# Patient Record
Sex: Male | Born: 1960 | Race: Black or African American | Hispanic: No
Health system: Southern US, Community
[De-identification: ages and names within clinical notes are randomized; demographics above are authoritative.]

## PROBLEM LIST (undated history)

## (undated) DIAGNOSIS — I10 Essential (primary) hypertension: Secondary | ICD-10-CM

## (undated) DIAGNOSIS — E119 Type 2 diabetes mellitus without complications: Secondary | ICD-10-CM

## (undated) DIAGNOSIS — Z720 Tobacco use: Secondary | ICD-10-CM

## (undated) DIAGNOSIS — E781 Pure hyperglyceridemia: Secondary | ICD-10-CM

## (undated) DIAGNOSIS — F1410 Cocaine abuse, uncomplicated: Secondary | ICD-10-CM

## (undated) DIAGNOSIS — I639 Cerebral infarction, unspecified: Secondary | ICD-10-CM

## (undated) DIAGNOSIS — F1010 Alcohol abuse, uncomplicated: Secondary | ICD-10-CM

## (undated) DIAGNOSIS — N289 Disorder of kidney and ureter, unspecified: Secondary | ICD-10-CM

## (undated) HISTORY — PX: NO PAST SURGERIES: SHX2092

---

## 2004-03-10 ENCOUNTER — Other Ambulatory Visit: Payer: Self-pay

## 2005-01-22 ENCOUNTER — Emergency Department: Payer: Self-pay | Admitting: Emergency Medicine

## 2005-04-09 ENCOUNTER — Emergency Department: Payer: Self-pay | Admitting: Emergency Medicine

## 2005-05-19 ENCOUNTER — Other Ambulatory Visit: Payer: Self-pay

## 2005-05-19 ENCOUNTER — Emergency Department: Payer: Self-pay | Admitting: Emergency Medicine

## 2005-06-17 ENCOUNTER — Emergency Department: Payer: Self-pay | Admitting: Emergency Medicine

## 2005-07-01 ENCOUNTER — Other Ambulatory Visit: Payer: Self-pay

## 2005-07-01 ENCOUNTER — Emergency Department: Payer: Self-pay | Admitting: General Practice

## 2006-01-12 ENCOUNTER — Emergency Department: Payer: Self-pay | Admitting: Emergency Medicine

## 2007-03-27 ENCOUNTER — Inpatient Hospital Stay: Payer: Self-pay | Admitting: Internal Medicine

## 2007-03-27 ENCOUNTER — Other Ambulatory Visit: Payer: Self-pay

## 2008-03-26 ENCOUNTER — Emergency Department: Payer: Self-pay | Admitting: Emergency Medicine

## 2009-10-20 ENCOUNTER — Emergency Department: Payer: Self-pay | Admitting: Unknown Physician Specialty

## 2010-12-02 ENCOUNTER — Emergency Department: Payer: Self-pay | Admitting: Emergency Medicine

## 2011-11-21 ENCOUNTER — Emergency Department: Payer: Self-pay | Admitting: Emergency Medicine

## 2011-11-21 LAB — BASIC METABOLIC PANEL
Chloride: 103 mmol/L (ref 98–107)
Co2: 20 mmol/L — ABNORMAL LOW (ref 21–32)
Creatinine: 1.01 mg/dL (ref 0.60–1.30)
EGFR (African American): >60
Glucose: 55 mg/dL — ABNORMAL LOW (ref 65–99)
Potassium: 3.5 mmol/L (ref 3.5–5.1)
Sodium: 138 mmol/L (ref 136–145)

## 2011-11-21 LAB — DRUG SCREEN, URINE
Barbiturates, Ur Screen: NEGATIVE (ref ?–200)
Benzodiazepine, Ur Scrn: NEGATIVE (ref ?–200)
Cannabinoid 50 Ng, Ur ~~LOC~~: NEGATIVE (ref ?–50)
Methadone, Ur Screen: NEGATIVE (ref ?–300)
Opiate, Ur Screen: NEGATIVE (ref ?–300)
Tricyclic, Ur Screen: NEGATIVE (ref ?–1000)

## 2011-11-21 LAB — URINALYSIS, COMPLETE
Bilirubin,UR: NEGATIVE
Blood: NEGATIVE
Hyaline Cast: 1
Leukocyte Esterase: NEGATIVE
Protein: NEGATIVE
Specific Gravity: 1.023 (ref 1.003–1.030)
Squamous Epithelial: 1
WBC UR: 2 /HPF (ref 0–5)

## 2011-11-21 LAB — CBC
HCT: 45.7 % (ref 40.0–52.0)
HGB: 14.4 g/dL (ref 13.0–18.0)
MCHC: 31.5 g/dL — ABNORMAL LOW (ref 32.0–36.0)
MCV: 91 fL (ref 80–100)
RDW: 13.6 % (ref 11.5–14.5)

## 2012-12-17 ENCOUNTER — Emergency Department: Payer: Self-pay | Admitting: Emergency Medicine

## 2012-12-17 LAB — CBC
HCT: 42.7 % (ref 40.0–52.0)
HGB: 14.2 g/dL (ref 13.0–18.0)
MCH: 29.5 pg (ref 26.0–34.0)
MCHC: 33.1 g/dL (ref 32.0–36.0)
MCV: 89 fL (ref 80–100)
RBC: 4.8 10*6/uL (ref 4.40–5.90)
WBC: 11 10*3/uL — ABNORMAL HIGH (ref 3.8–10.6)

## 2012-12-17 LAB — COMPREHENSIVE METABOLIC PANEL
Albumin: 3.9 g/dL (ref 3.4–5.0)
Alkaline Phosphatase: 115 U/L (ref 50–136)
Anion Gap: 10 (ref 7–16)
BUN: 15 mg/dL (ref 7–18)
Calcium, Total: 8.8 mg/dL (ref 8.5–10.1)
Co2: 24 mmol/L (ref 21–32)
Creatinine: 1.22 mg/dL (ref 0.60–1.30)
EGFR (African American): >60
EGFR (Non-African Amer.): >60
Glucose: 111 mg/dL — ABNORMAL HIGH (ref 65–99)
SGOT(AST): 34 U/L (ref 15–37)
Sodium: 141 mmol/L (ref 136–145)
Total Protein: 7.6 g/dL (ref 6.4–8.2)

## 2012-12-17 LAB — DRUG SCREEN, URINE
Amphetamines, Ur Screen: NEGATIVE (ref ?–1000)
Barbiturates, Ur Screen: NEGATIVE (ref ?–200)
Benzodiazepine, Ur Scrn: NEGATIVE (ref ?–200)
MDMA (Ecstasy)Ur Screen: NEGATIVE (ref ?–500)
Methadone, Ur Screen: NEGATIVE (ref ?–300)
Opiate, Ur Screen: NEGATIVE (ref ?–300)
Tricyclic, Ur Screen: NEGATIVE (ref ?–1000)

## 2012-12-17 LAB — ETHANOL: Ethanol %: 0.056 % (ref 0.000–0.080)

## 2012-12-17 LAB — SALICYLATE LEVEL: Salicylates, Serum: 1.8 mg/dL

## 2012-12-17 LAB — TSH: Thyroid Stimulating Horm: 1.52 u[IU]/mL

## 2012-12-17 LAB — ACETAMINOPHEN LEVEL: Acetaminophen: <2 ug/mL

## 2013-07-24 ENCOUNTER — Emergency Department: Payer: Self-pay | Admitting: Emergency Medicine

## 2013-07-24 LAB — CBC
HCT: 45.8 % (ref 40.0–52.0)
HGB: 15.2 g/dL (ref 13.0–18.0)
MCH: 29.5 pg (ref 26.0–34.0)
MCHC: 33.3 g/dL (ref 32.0–36.0)
MCV: 89 fL (ref 80–100)
Platelet: 222 10*3/uL (ref 150–440)
RBC: 5.16 10*6/uL (ref 4.40–5.90)
RDW: 14.4 % (ref 11.5–14.5)
WBC: 10.8 10*3/uL — ABNORMAL HIGH (ref 3.8–10.6)

## 2013-07-24 LAB — URINALYSIS, COMPLETE
BACTERIA: NONE SEEN
BLOOD: NEGATIVE
Bilirubin,UR: NEGATIVE
GLUCOSE, UR: NEGATIVE mg/dL (ref 0–75)
KETONE: NEGATIVE
Leukocyte Esterase: NEGATIVE
Nitrite: NEGATIVE
Ph: 5 (ref 4.5–8.0)
Protein: NEGATIVE
SPECIFIC GRAVITY: 1.016 (ref 1.003–1.030)
WBC UR: <1 /HPF (ref 0–5)

## 2013-07-24 LAB — COMPREHENSIVE METABOLIC PANEL
ALK PHOS: 118 U/L — AB
Albumin: 4.2 g/dL (ref 3.4–5.0)
Anion Gap: 9 (ref 7–16)
BILIRUBIN TOTAL: 0.3 mg/dL (ref 0.2–1.0)
BUN: 11 mg/dL (ref 7–18)
CHLORIDE: 101 mmol/L (ref 98–107)
Calcium, Total: 9.2 mg/dL (ref 8.5–10.1)
Co2: 25 mmol/L (ref 21–32)
Creatinine: 0.97 mg/dL (ref 0.60–1.30)
EGFR (African American): >60
EGFR (Non-African Amer.): >60
GLUCOSE: 95 mg/dL (ref 65–99)
OSMOLALITY: 269 (ref 275–301)
Potassium: 3.9 mmol/L (ref 3.5–5.1)
SGOT(AST): 28 U/L (ref 15–37)
SGPT (ALT): 31 U/L (ref 12–78)
SODIUM: 135 mmol/L — AB (ref 136–145)
Total Protein: 8.5 g/dL — ABNORMAL HIGH (ref 6.4–8.2)

## 2013-07-24 LAB — ETHANOL
ETHANOL LVL: 60 mg/dL
Ethanol %: 0.06 % (ref 0.000–0.080)

## 2013-07-24 LAB — DRUG SCREEN, URINE
AMPHETAMINES, UR SCREEN: NEGATIVE (ref ?–1000)
BARBITURATES, UR SCREEN: NEGATIVE (ref ?–200)
BENZODIAZEPINE, UR SCRN: NEGATIVE (ref ?–200)
COCAINE METABOLITE, UR ~~LOC~~: POSITIVE (ref ?–300)
Cannabinoid 50 Ng, Ur ~~LOC~~: NEGATIVE (ref ?–50)
MDMA (ECSTASY) UR SCREEN: NEGATIVE (ref ?–500)
METHADONE, UR SCREEN: NEGATIVE (ref ?–300)
OPIATE, UR SCREEN: NEGATIVE (ref ?–300)
Phencyclidine (PCP) Ur S: NEGATIVE (ref ?–25)
Tricyclic, Ur Screen: NEGATIVE (ref ?–1000)

## 2013-07-24 LAB — SALICYLATE LEVEL: Salicylates, Serum: 2.6 mg/dL

## 2013-07-24 LAB — ACETAMINOPHEN LEVEL: Acetaminophen: <2 ug/mL

## 2014-01-13 ENCOUNTER — Emergency Department: Payer: Self-pay | Admitting: Emergency Medicine

## 2014-01-13 LAB — CBC
HCT: 43.7 % (ref 40.0–52.0)
HGB: 14.4 g/dL (ref 13.0–18.0)
MCH: 29.3 pg (ref 26.0–34.0)
MCHC: 32.9 g/dL (ref 32.0–36.0)
MCV: 89 fL (ref 80–100)
Platelet: 220 10*3/uL (ref 150–440)
RBC: 4.91 10*6/uL (ref 4.40–5.90)
RDW: 14.7 % — AB (ref 11.5–14.5)
WBC: 10.5 10*3/uL (ref 3.8–10.6)

## 2014-01-13 LAB — URINALYSIS, COMPLETE
Bacteria: NONE SEEN
Bilirubin,UR: NEGATIVE
Blood: NEGATIVE
GLUCOSE, UR: NEGATIVE mg/dL (ref 0–75)
Ketone: NEGATIVE
LEUKOCYTE ESTERASE: NEGATIVE
Nitrite: NEGATIVE
Ph: 5 (ref 4.5–8.0)
Protein: NEGATIVE
RBC,UR: <1 /HPF (ref 0–5)
Specific Gravity: 1.024 (ref 1.003–1.030)
WBC UR: 1 /HPF (ref 0–5)

## 2014-01-13 LAB — COMPREHENSIVE METABOLIC PANEL
ALBUMIN: 3.8 g/dL (ref 3.4–5.0)
AST: 35 U/L (ref 15–37)
Alkaline Phosphatase: 94 U/L
Anion Gap: 3 — ABNORMAL LOW (ref 7–16)
BILIRUBIN TOTAL: 0.2 mg/dL (ref 0.2–1.0)
BUN: 11 mg/dL (ref 7–18)
CALCIUM: 8.4 mg/dL — AB (ref 8.5–10.1)
CO2: 22 mmol/L (ref 21–32)
CREATININE: 1.17 mg/dL (ref 0.60–1.30)
Chloride: 110 mmol/L — ABNORMAL HIGH (ref 98–107)
EGFR (African American): >60
Glucose: 93 mg/dL (ref 65–99)
Osmolality: 269 (ref 275–301)
Potassium: 3.9 mmol/L (ref 3.5–5.1)
SGPT (ALT): 37 U/L
Sodium: 135 mmol/L — ABNORMAL LOW (ref 136–145)
TOTAL PROTEIN: 8.1 g/dL (ref 6.4–8.2)

## 2014-01-13 LAB — ETHANOL
Ethanol %: 0.044 % (ref 0.000–0.080)
Ethanol: 44 mg/dL

## 2014-01-13 LAB — DRUG SCREEN, URINE
Amphetamines, Ur Screen: NEGATIVE (ref ?–1000)
Barbiturates, Ur Screen: NEGATIVE (ref ?–200)
Benzodiazepine, Ur Scrn: NEGATIVE (ref ?–200)
COCAINE METABOLITE, UR ~~LOC~~: POSITIVE (ref ?–300)
Cannabinoid 50 Ng, Ur ~~LOC~~: NEGATIVE (ref ?–50)
MDMA (ECSTASY) UR SCREEN: NEGATIVE (ref ?–500)
Methadone, Ur Screen: NEGATIVE (ref ?–300)
Opiate, Ur Screen: NEGATIVE (ref ?–300)
Phencyclidine (PCP) Ur S: NEGATIVE (ref ?–25)
TRICYCLIC, UR SCREEN: NEGATIVE (ref ?–1000)

## 2014-01-13 LAB — ACETAMINOPHEN LEVEL: Acetaminophen: <2 ug/mL

## 2014-01-13 LAB — SALICYLATE LEVEL: Salicylates, Serum: 3.6 mg/dL — ABNORMAL HIGH

## 2014-01-29 ENCOUNTER — Emergency Department: Payer: Self-pay | Admitting: Emergency Medicine

## 2014-01-29 LAB — URINALYSIS, COMPLETE
BACTERIA: NONE SEEN
BLOOD: NEGATIVE
Bilirubin,UR: NEGATIVE
Glucose,UR: NEGATIVE mg/dL (ref 0–75)
LEUKOCYTE ESTERASE: NEGATIVE
Nitrite: NEGATIVE
Ph: 5 (ref 4.5–8.0)
Protein: NEGATIVE
RBC,UR: NONE SEEN /HPF (ref 0–5)
SQUAMOUS EPITHELIAL: NONE SEEN
Specific Gravity: 1.01 (ref 1.003–1.030)
WBC UR: 1 /HPF (ref 0–5)

## 2014-01-29 LAB — CBC WITH DIFFERENTIAL/PLATELET
BASOS ABS: 0.1 10*3/uL (ref 0.0–0.1)
Basophil %: 0.7 %
Eosinophil #: 0.2 10*3/uL (ref 0.0–0.7)
Eosinophil %: 2.1 %
HCT: 45.2 % (ref 40.0–52.0)
HGB: 14.4 g/dL (ref 13.0–18.0)
LYMPHS ABS: 4.1 10*3/uL — AB (ref 1.0–3.6)
LYMPHS PCT: 38.3 %
MCH: 29 pg (ref 26.0–34.0)
MCHC: 31.8 g/dL — AB (ref 32.0–36.0)
MCV: 91 fL (ref 80–100)
MONOS PCT: 7.5 %
Monocyte #: 0.8 x10 3/mm (ref 0.2–1.0)
NEUTROS PCT: 51.4 %
Neutrophil #: 5.5 10*3/uL (ref 1.4–6.5)
Platelet: 227 10*3/uL (ref 150–440)
RBC: 4.97 10*6/uL (ref 4.40–5.90)
RDW: 14.8 % — AB (ref 11.5–14.5)
WBC: 10.8 10*3/uL — ABNORMAL HIGH (ref 3.8–10.6)

## 2014-01-29 LAB — COMPREHENSIVE METABOLIC PANEL
ALK PHOS: 102 U/L
Albumin: 3.8 g/dL (ref 3.4–5.0)
Anion Gap: 10 (ref 7–16)
BUN: 11 mg/dL (ref 7–18)
Bilirubin,Total: 0.5 mg/dL (ref 0.2–1.0)
CHLORIDE: 106 mmol/L (ref 98–107)
CO2: 22 mmol/L (ref 21–32)
CREATININE: 1 mg/dL (ref 0.60–1.30)
Calcium, Total: 8.7 mg/dL (ref 8.5–10.1)
EGFR (Non-African Amer.): >60
GLUCOSE: 80 mg/dL (ref 65–99)
Osmolality: 274 (ref 275–301)
Potassium: 4.4 mmol/L (ref 3.5–5.1)
SGOT(AST): 44 U/L — ABNORMAL HIGH (ref 15–37)
SGPT (ALT): 38 U/L
SODIUM: 138 mmol/L (ref 136–145)
Total Protein: 7.7 g/dL (ref 6.4–8.2)

## 2014-01-29 LAB — LIPASE, BLOOD: Lipase: 183 U/L (ref 73–393)

## 2014-10-02 NOTE — Consult Note (Signed)
PATIENT NAME:  Clinton Knight, PANIK MR#:  [id number] DATE OF BIRTH:  Jan 14, 1961  DATE OF CONSULTATION:  12/17/2012  CONSULTING PHYSICIAN:  Adrianne Shackleton S. Garnetta Buddy, MD  REFERRING PHYSICIAN: Bayard Males, M.D.    REASON FOR CONSULTATION:  Depression and polysubstance ingestion.   HISTORY OF PRESENT ILLNESS: The patient is a 54 year old African American male who presented to the Emergency Room as he was feeling depressed and was having thoughts about suicide. He reported that he has been using cocaine as well as drinking alcohol. The patient reported that he was not feeling well so he decided to come to the hospital to seek help. He reported that he was having thoughts to shoot himself with a gun, but does not think that he will ever do it. He reported that he has been consuming approximately 2 quarts of beer per day and was using crack cocaine and was unable to stop. He stated that he has a long history of using substance abuse and had multiple inpatient hospitalization for  substance use. The patient stated that he drinks a couple of 40s per day. He says use of cocaine when he can get it. He stated that his drug of choice is cocaine. He has been using cocaine for a long time. The patient reported that he was just tired of using drugs and wanted some help. However, he reported that he does not have any thoughts to harm himself at this time. He denied having any paranoia. He denied having any depressive symptoms, but wants help. He stated that he does not feel anxious. During my interview, the patient was somewhat agitated as he reported that he has already answered all these questions to several other people and why am I asking the same questions over and over again. He reported that since he is homeless, he just needs help to go somewhere where he can get rehabilitation for his drug use. He currently denied having any perceptual disturbances. He denied any suicidal ideations or plans.   PAST PSYCHIATRIC HISTORY: The  patient reported that he has been admitted to multiple rehabilitation programs to help with his drug use, but was unable to remain sober. He has been to [facility], [facility], [facility], [facility] in the past. He stated that his longest sobriety was only for 2 months. He is unable to stop. 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   SUBSTANCE ABUSE HISTORY: The patient reported that he has been using cocaine for more than 2 years. His drug of choice is cocaine. He drinks on a daily basis. He does not have any history of withdrawal symptoms. He denies using other illicit drugs.   FAMILY HISTORY: The patient reported that everybody in his family uses alcohol.   SOCIAL HISTORY: The patient reported that he has never been married but has 2 children, and he does not have any contact with them.   PAST MEDICAL HISTORY: He denied having any medical problems.   ALLERGIES: No known drug allergies.   REVIEW OF SYSTEMS:  CONSTITUTIONAL: Denies any fever or chills. No weight changes.  EYES: No double or blurred vision.  RESPIRATORY: No shortness of breath or cough.  CARDIOVASCULAR: No chest pain or orthopnea. GASTROINTESTINAL:  No abdominal pain, nausea, vomiting, diarrhea.  GENITOURINARY: No incontinence or frequency.  ENDOCRINE: No heat or cold intolerance.  LYMPHATIC: No anemia or easy  bruising.  INTEGUMENTARY: No acne or rash.  MUSCULOSKELETAL: No muscle or joint pain.   PHYSICAL EXAMINATION:  VITAL SIGNS: Temperature 97.8, pulse 60, respirations 18, blood pressure 131/60.  LABORATORY DATA:   Glucose 111, BUN 15, creatinine 1.22, sodium 141, potassium 3.9, chloride 107, bicarbonate 24, anion gap 10, osmolality 283, calcium 8.8, alcohol level 56, total protein 7.6, albumin 3.9, bilirubin,  alkaline phosphatase 115, AST 34, ALT 42. TSH 1.52. Urine drug screen positive for cocaine. WBC  11, RBC 4.8, hemoglobin 14.2, hematocrit 42.7, MCV 89, RDW 13.4.   MENTAL STATUS EXAMINATION: The patient is a moderately built male who was lying in the bed. He was somewhat irritated and agitated. He maintains fair eye contact. His mood was anxious. Affect was congruent. Thought process was circumstantial. Thought content was nondelusional. He currently denied having any suicidal or homicidal ideations or plans. He denied having any perceptual disturbances. He demonstrated poor insight and judgment regarding his drug use.   DIAGNOSTIC IMPRESSION: AXIS I: 1.  Alcohol dependence.  2.  Cocaine dependence.  3.  Mood disorder, not otherwise specified.  TREATMENT PLAN:   1.  He will be started on Librium 25 mg p.o. q. 6 hours for 2 days for his alcohol use.  2.  He will be referred to [facility]  for his drug use, as he is interested in going to a drug rehab program at this time. The patient agreed with the plan. Once he is clinically stable and a bed becomes available, he will be transferred over there. The patient agreed with the plan.   Thank you for allowing me to participate in the care of this patient.   ____________________________ Ardeen FillersUzma S. Garnetta BuddyFaheem, MD usf:rw D: 12/17/2012 13:56:00 ET T: 12/17/2012 14:19:36 ET JOB#: [id number]  cc: Ardeen FillersUzma S. Garnetta BuddyFaheem, MD, <Dictator> Rhunette CroftUZMA S Cielo Arias MD ELECTRONICALLY SIGNED 12/19/2012 10:07

## 2014-10-03 NOTE — Consult Note (Signed)
PATIENT NAME:  Clinton Knight, Clinton Knight MR#:  [id number] DATE OF BIRTH:  June 04, 1961  DATE OF ADMISSION: 07/24/2013   DATE OF CONSULTATION:  07/24/2013  REFERRING PHYSICIAN:  Enedina Finnerandolph N. Manson PasseyBrown, MD CONSULTING PHYSICIAN:  Jolanta B. Pucilowska, MD  REASON FOR CONSULTATION: To evaluate a patient with substance abuse.   IDENTIFYING DATA: Clinton Knight is a 54 year old male with history of substance use and depression.   CHIEF COMPLAINT: "I need to stop."    HISTORY OF PRESENT ILLNESS: Clinton Knight has a long history of alcohol and cocaine abuse. His habit escalated in the past month after his sister kicked him out of the house. He has been staying with her for several months and had a job then. Once he lost his job, did not contribute to the household and was using substances, she asked him to leave. He is homeless with nowhere to go. He came to the hospital asking for substance abuse treatment. He reports daily drinking and cocaine use. He likes to mix cocaine and alcohol the most. He has not been able to afford as much drugs as he would like to lately. He denies blackouts or history of delirium tremens. He endorses many symptoms of depression, with poor sleep, decreased appetite, anhedonia, feeling of guilt, hopelessness, worthlessness, poor energy and concentration, social isolation, crying spells. He denies suicidal ideation at present. I noticed in reviewing his chart that in the past, he at times voiced suicidal ideation with no intention or plan. He denies psychotic symptoms. He denies symptoms suggestive of bipolar mania.   PAST PSYCHIATRIC HISTORY: He has been in substance abuse treatment multiple times at [facility], [facility], [place] and [facility] rehab facility. His last admission to [facility] was in July. He was able to maintain sobriety for 2 months following discharge. He would like to get another chance. He reports that in the past he was prescribed medications for depression, but it was so long ago that he  does not remember the names nor does he remember if they were helpful. In any case, he is unemployed with no insurance, and it is difficult for him to buy medications.   FAMILY PSYCHIATRIC HISTORY: Multiple family members with substance abuse.   PAST MEDICAL HISTORY: None.   ALLERGIES: No known drug allergies.   MEDICATIONS ON ADMISSION: None.   SOCIAL HISTORY: He graduated from high school. He does not have steady employment. He has never been married, but has 2 children with whom he does not stay in touch. He feels that he broke his last bridge with his family. He denies any current legal charges pending.  REVIEW OF SYSTEMS:  CONSTITUTIONAL: No fevers or chills. No weight changes.  EYES: No double or blurred vision.  ENT: No hearing loss.  RESPIRATORY: No shortness of breath or cough.  CARDIOVASCULAR: No chest pain or orthopnea.  GASTROINTESTINAL: No abdominal pain, nausea, vomiting or diarrhea.  GENITOURINARY: No incontinence or frequency.  ENDOCRINE: No heat or cold intolerance.  LYMPHATIC: No anemia or easy bruising.  INTEGUMENTARY: No acne or rash.  MUSCULOSKELETAL: No muscle or joint pain.  NEUROLOGIC: No tingling or weakness.  PSYCHIATRIC: See history of present illness for details.   PHYSICAL EXAMINATION:  VITAL SIGNS: Blood pressure 142/78, pulse 63, respirations 18, temperature 97.9.  GENERAL: This is a well-developed male in no acute distress.  The rest of the physical examination is deferred to his primary attending.   LABORATORY DATA: Chemistries are within normal limits except for sodium of 135. LFTs within  normal limits. Blood alcohol level 0.06. Urine tox screen positive for cocaine. CBC within normal limits except for white blood count of 10.8. Urinalysis is not suggestive of urinary tract infection. Serum acetaminophen and salicylates are low.   MENTAL STATUS EXAMINATION: The patient is asleep, but easily arousable. He is oriented to person, place, time and  situation. He is pleasant, polite and cooperative. He is well groomed, wearing hospital scrubs and a yellow shirt. He maintains good eye contact. His speech is of normal rhythm, rate and volume. Mood is depressed with flat affect. Thought process is logical and goal oriented. Thought content: He denies suicidal or homicidal ideation. There are no delusions or paranoia. There are no auditory or visual hallucinations. His cognition is grossly intact. He registers 3 out of 3 and recalls 3 out of 3 objects after 5 minutes. He can spell world forwards and backwards. He knows the current president. He is of average intelligence with a good fund of knowledge. His insight and judgment are questionable.   DIAGNOSIS:  AXIS I: Alcohol dependence, cocaine dependence, substance-induced mood disorder.  AXIS II: Deferred.  AXIS III: Deferred.  AXIS IV: Substance abuse, employment, financial, housing, access to care, family conflict.  AXIS V: Global assessment of functioning 45.   PLAN:  1. The patient does not meet criteria for IVC.  2. The patient is already on CIWA protocol, please continue.  3. He is not really interested in treatment of depression. We will not recommend any medications at this point.  4. He is referred to [facility] detox facility. Transfer as soon as bed available, possibly tonight.   ____________________________ Ellin Goodie. Jennet Maduro, MD jbp:lb D: 07/24/2013 16:03:28 ET T: 07/24/2013 16:23:15 ET JOB#: [id number]  cc: Jolanta B. Jennet Maduro, MD, <Dictator> Shari Prows MD ELECTRONICALLY SIGNED 08/10/2013 23:12

## 2014-10-03 NOTE — Consult Note (Signed)
Brief Consult Note: Diagnosis: Alcohol dependence, cocaine dependence, substance induced mood disorder.   Patient was seen by consultant.   Consult note dictated.   Recommend to proceed with surgery or procedure.   Recommend further assessment or treatment.   Comments: [name] has a h/o substance use. He has been using cocaine and drinking for years. More heavily for the past month after his sister kicked him out of the house. He is not suicidal or homicidal. He is interested in detox at [facility].  PLAN: 1. The patient does not meet criteria for IVC.   2. He is on CIWA protocol.  3. He will be referred to [facility].  Electronic Signatures: Kristine LineaPucilowska, Natha Guin (MD)  (Signed 12-Feb-15 14:55)  Authored: Brief Consult Note   Last Updated: 12-Feb-15 14:55 by Kristine LineaPucilowska, Kushi Kun (MD)

## 2014-10-03 NOTE — Consult Note (Signed)
PATIENT NAME:  Clinton Knight, Clinton Knight MR#:  [id number] DATE OF BIRTH:  [birth year]  DATE OF CONSULTATION:  01/13/2014  CONSULTING PHYSICIAN:  Audery Amel, MD.  IDENTIFYING INFORMATION AND REASON FOR CONSULT: A 54 year old gentleman came to the Emergency Room stating "I need detox for cocaine." Question about disposition.   HISTORY OF PRESENT ILLNESS: Information obtained from the patient and the chart. The patient came in stating he wanted to be detoxed from cocaine. He says he uses crack cocaine, smoking every day. He drinks a little bit, but does not think it is a major problem. Not reporting any major alcohol withdrawal. Denies that he is using other drugs. His mood has been feeling down and a little bit depressed. He has not been staying in the homeless shelter and he has no place to live, sounds like he has probably been living pretty rough. He says he has had some suicidal thoughts without any specific plan or intention to act on it. Denies any psychotic symptoms. Not getting any mental health treatment.   PAST PSYCHIATRIC HISTORY: Long history of substance abuse problems. Has had some periods of extended sobriety but it has been a while. Has been to inpatient detox units. Does not sound like he has been involved in more intensive long-term treatment. He has never, by his account, been treated for depression or any other mental health problems besides his cocaine abuse.  It has been documented in the old records that he has made suicidal comments in the past, but he has never actually tried to harm himself. He denies any history of violence.   SOCIAL HISTORY: Has no place to live. It sounds like he has been living here and there recently, not using the shelter. Not working. Minimal support. He has siblings that he does not have much contact with, no other family that is supportive.   PAST MEDICAL HISTORY: No significant ongoing medical problems.   MEDICATIONS: None.   ALLERGIES: No known drug  allergies.   FAMILY HISTORY: None reported.   SUBSTANCE ABUSE HISTORY: Primarily cocaine. Some alcohol. No history of seizures or delirium tremens.   REVIEW OF SYSTEMS: Feeling a little bit depressed and down, not completely hopeless, suicidal thoughts with no intent or plan. No hallucinations or delusions. No psychotic symptoms. No other specific physical complaints.   MENTAL STATUS EXAMINATION: Adequately groomed gentleman, looks in good health, cooperative with the interview. Good eye contact, normal psychomotor activity. Speech a little bit quiet but normal rate and tone. Affect is slightly blunted but not severely so. He is able laugh appropriately during the interview. Mood is stated as being down. Thoughts are lucid without loosening of associations or delusions. Denies auditory or visual hallucinations. Denies any homicidal ideation. Suicidal thoughts were very passive with no intention or plan of acting on them and when he reflects on it he has no thought or fear that he is going to actually act on trying to harm himself. He is alert and oriented x 4.  He can repeat 3 out of 3 objects immediately; he cannot remember any of them at about 2 minutes, but otherwise seems to be able to understand the current conversation and make medical decisions.   LABORATORY RESULTS: Drug screen positive for cocaine. Chemistry panel: Slightly (Dictation Anomaly)<< MISSING TEXT>>  sodium at 135, elevated chloride 110. Alcohol level was 44 when he presented at 3 in the morning. Hematology panel all normal.   VITAL SIGNS: Most recent blood pressure 122/79, respirations 16, pulse  64, temperature 98.4.   MEDICINES SINCE BEING IN THE EMERGENCY ROOM: None.   ASSESSMENT: This is a 54 year old gentleman voluntarily in Emergency Room, primarily for cocaine dependence treatment. He reported some vague suicidal thoughts but he is lucid and clear in his thinking and is very clear that he has no intention or plan of acting  on them. Not psychotic. Has been able to take care of himself up to this point. Does not meet criteria for requiring inpatient psychiatric treatment.   TREATMENT PLAN: All of this was explained to the patient, but he was encouraged to follow up with outpatient treatment because of the importance of stopping his cocaine abuse. He will be referred up to [facility] and given information about the [facility] and encouraged to get himself into a more stable place to live. No indication to start any medication. No indication for commitment petition. Discussed the case with the Emergency Room physician.   DIAGNOSIS, PRINCIPAL AND PRIMARY:  AXIS I: Substance-induced mood disorder, depressed.   SECONDARY DIAGNOSES:  AXIS I:  1.  Cocaine dependence.  2.  Alcohol abuse versus dependence.  AXIS II: Deferred. AXIS II: Deferred.  AXIS III: No diagnosis.  AXIS IV: Severe from homelessness.  AXIS V: Functioning at time of evaluation: 50.    ____________________________ Audery AmelJohn T. Clapacs, MD jtc:lt D: 01/13/2014 12:18:01 ET T: 01/13/2014 12:58:21 ET JOB#: [id number]  cc: Audery AmelJohn T. Clapacs, MD, <Dictator> Audery AmelJOHN T CLAPACS MD ELECTRONICALLY SIGNED 01/21/2014 0:41

## 2015-04-12 ENCOUNTER — Emergency Department: Payer: Self-pay

## 2015-04-12 ENCOUNTER — Encounter: Payer: Self-pay | Admitting: Emergency Medicine

## 2015-04-12 ENCOUNTER — Emergency Department
Admission: EM | Admit: 2015-04-12 | Discharge: 2015-04-12 | Disposition: A | Discharge status: Home / Self Care | Payer: Self-pay | Attending: Emergency Medicine | Admitting: Emergency Medicine

## 2015-04-12 DIAGNOSIS — Z720 Tobacco use: Secondary | ICD-10-CM | POA: Insufficient documentation

## 2015-04-12 DIAGNOSIS — R059 Cough, unspecified: Secondary | ICD-10-CM

## 2015-04-12 DIAGNOSIS — R42 Dizziness and giddiness: Secondary | ICD-10-CM | POA: Insufficient documentation

## 2015-04-12 DIAGNOSIS — R0602 Shortness of breath: Secondary | ICD-10-CM | POA: Insufficient documentation

## 2015-04-12 DIAGNOSIS — R079 Chest pain, unspecified: Secondary | ICD-10-CM | POA: Insufficient documentation

## 2015-04-12 DIAGNOSIS — R05 Cough: Secondary | ICD-10-CM | POA: Insufficient documentation

## 2015-04-12 LAB — CBC
HEMATOCRIT: 46 % (ref 40.0–52.0)
HEMOGLOBIN: 15.2 g/dL (ref 13.0–18.0)
MCH: 28.8 pg (ref 26.0–34.0)
MCHC: 33 g/dL (ref 32.0–36.0)
MCV: 87.3 fL (ref 80.0–100.0)
Platelets: 220 10*3/uL (ref 150–440)
RBC: 5.27 MIL/uL (ref 4.40–5.90)
RDW: 14 % (ref 11.5–14.5)
WBC: 12.4 10*3/uL — ABNORMAL HIGH (ref 3.8–10.6)

## 2015-04-12 LAB — COMPREHENSIVE METABOLIC PANEL
ALBUMIN: 4.4 g/dL (ref 3.5–5.0)
ALK PHOS: 95 U/L (ref 38–126)
ALT: 26 U/L (ref 17–63)
ANION GAP: 10 (ref 5–15)
AST: 26 U/L (ref 15–41)
BUN: 13 mg/dL (ref 6–20)
CALCIUM: 9.4 mg/dL (ref 8.9–10.3)
CO2: 25 mmol/L (ref 22–32)
Chloride: 103 mmol/L (ref 101–111)
Creatinine, Ser: 1.04 mg/dL (ref 0.61–1.24)
GFR calc Af Amer: >60 mL/min (ref >60)
Glucose, Bld: 94 mg/dL (ref 65–99)
Potassium: 4.1 mmol/L (ref 3.5–5.1)
Sodium: 138 mmol/L (ref 135–145)
TOTAL PROTEIN: 8.3 g/dL — AB (ref 6.5–8.1)
Total Bilirubin: 0.2 mg/dL — ABNORMAL LOW (ref 0.3–1.2)

## 2015-04-12 LAB — TROPONIN I: Troponin I: <0.03 ng/mL (ref <0.031)

## 2015-04-12 MED ORDER — GI COCKTAIL ~~LOC~~
30.0000 mL | Freq: Once | ORAL | Status: AC
  Administered 2015-04-12: 30 mL via ORAL
  Filled 2015-04-12: qty 30

## 2015-04-12 MED ORDER — KETOROLAC TROMETHAMINE 30 MG/ML IJ SOLN
30.0000 mg | Freq: Once | INTRAMUSCULAR | Status: AC
  Administered 2015-04-12: 30 mg via INTRAVENOUS
  Filled 2015-04-12: qty 1

## 2015-04-12 NOTE — ED Provider Notes (Signed)
[facility] Emergency Department Provider Note  ____________________________________________  Time seen: Approximately 246 AM  I have reviewed the triage vital signs and the nursing notes.   HISTORY  Chief Complaint Chest Pain    HPI Clinton Knight is a 54 y.o. male who comes into the hospital today with chest pain and cough. The patient reports this pain is a 6 out of 10 in intensity and sharp. The patient reports that he does not have any radiation of the pain. This pain has been going on for the last 2 weeks but reports it is worse with cough. He reports it is not worse with breathing. The patient has not been taking anything for the pain is just reports that it hurts. The patient reports that he has also felt a little dizzy but he has not eaten well today. He reports that he had some beer today. The patient has had this pain before but reports that no one was ever able to tell him what was going on. The patient denies any shortness of breath nausea or vomiting. The patient is here as he is unable to tolerate the pain anymore.   History reviewed. No pertinent past medical history.  There are no active problems to display for this patient.   History reviewed. No pertinent past surgical history.  No current outpatient prescriptions on file.  Allergies Review of patient's allergies indicates no known allergies.  History reviewed. No pertinent family history.  Social History Social History  Substance Use Topics  . Smoking status: Current Every Day Smoker -- 1.00 packs/day    Types: Cigarettes  . Smokeless tobacco: None  . Alcohol Use: Yes    Review of Systems Constitutional: No fever/chills Eyes: No visual changes. ENT: No sore throat. Cardiovascular: chest pain. Respiratory:  shortness of breath. Gastrointestinal: No abdominal pain.  No nausea, no vomiting.  No diarrhea.  No constipation. Genitourinary: Negative for dysuria. Musculoskeletal:  Negative for back pain. Skin: Negative for rash. Neurological: dizziness  10-point ROS otherwise negative.  ____________________________________________   PHYSICAL EXAM:  VITAL SIGNS: ED Triage Vitals  Enc Vitals Group     BP 04/12/15 0251 146/97 mmHg     Pulse Rate 04/12/15 0251 69     Resp 04/12/15 0251 18     Temp 04/12/15 0251 98 F (36.7 C)     Temp Source 04/12/15 0251 Oral     SpO2 04/12/15 0251 97 %     Weight 04/12/15 0251 180 lb (81.647 kg)     Height 04/12/15 0251 5\' 6"  (1.676 m)     Head Cir --      Peak Flow --      Pain Score 04/12/15 0249 7     Pain Loc --      Pain Edu? --      Excl. in GC? --     Constitutional: Alert and oriented. Well appearing and in mild distress. Eyes: Conjunctivae are normal. PERRL. EOMI. Head: Atraumatic. Nose: No congestion/rhinnorhea. Mouth/Throat: Mucous membranes are moist.  Oropharynx non-erythematous. Cardiovascular: Normal rate, regular rhythm. Grossly normal heart sounds.  Good peripheral circulation. Respiratory: Normal respiratory effort.  No retractions. Lungs CTAB. Chest tender to palpation Gastrointestinal: Soft and nontender. No distention. Positive bowel sounds Musculoskeletal: No lower extremity tenderness nor edema.   Neurologic:  Normal speech and language. No gross focal neurologic deficits are appreciated.  Skin:  Skin is warm, dry and intact.  Psychiatric: Mood and affect are normal.   ____________________________________________  LABS (all labs ordered are listed, but only abnormal results are displayed)  Labs Reviewed  CBC - Abnormal; Notable for the following:    WBC 12.4 (*)    All other components within normal limits  COMPREHENSIVE METABOLIC PANEL - Abnormal; Notable for the following:    Total Protein 8.3 (*)    Total Bilirubin 0.2 (*)    All other components within normal limits  TROPONIN I  TROPONIN I   ____________________________________________  EKG  ED ECG REPORT I, Rebecka Apley, the attending physician, personally viewed and interpreted this ECG.   Date: 04/12/2015  EKG Time: 253  Rate: 66  Rhythm: normal sinus rhythm  Axis: Normal  Intervals:none  ST&T Change: None  ____________________________________________  RADIOLOGY  Chest x-ray: No active cardiopulmonary disease. ____________________________________________   PROCEDURES  Procedure(s) performed: None  Critical Care performed: No  ____________________________________________   INITIAL IMPRESSION / ASSESSMENT AND PLAN / ED COURSE  Pertinent labs & imaging results that were available during my care of the patient were reviewed by me and considered in my medical decision making (see chart for details).  This is a 54 year old male who comes in today with some chest pain and cough. The patient has been having this for 2 weeks. We did do an x-ray and I did give the patient a GI cocktail. I will also give the patient dose of Toradol and reassess his pain.  The patient's blood work and x-rays are unremarkable. I will discharge the patient to home and have him follow-up with cardiology for further evaluation with stress testing and possible ultrasound. ____________________________________________   FINAL CLINICAL IMPRESSION(S) / ED DIAGNOSES  Final diagnoses:  Chest pain, unspecified chest pain type  Cough      Rebecka Apley, MD 04/12/15 [phone]

## 2015-04-12 NOTE — ED Notes (Signed)
Pt arrives via EMS from home with c/o Left sided CP, sharp, 7/10.  Pt states that the pain worsens with cough.  Pt with SOB and dizziness.  Pain as been int. For the last 1 1/2 weeks.

## 2015-04-12 NOTE — Discharge Instructions (Signed)
Nonspecific Chest Pain  °Chest pain can be caused by many different conditions. There is always a chance that your pain could be related to something serious, such as a heart attack or a blood clot in your lungs. Chest pain can also be caused by conditions that are not life-threatening. If you have chest pain, it is very important to follow up with your health care provider. °CAUSES  °Chest pain can be caused by: °· Heartburn. °· Pneumonia or bronchitis. °· Anxiety or stress. °· Inflammation around your heart (pericarditis) or lung (pleuritis or pleurisy). °· A blood clot in your lung. °· A collapsed lung (pneumothorax). It can develop suddenly on its own (spontaneous pneumothorax) or from trauma to the chest. °· Shingles infection (varicella-zoster virus). °· Heart attack. °· Damage to the bones, muscles, and cartilage that make up your chest wall. This can include: °¨ Bruised bones due to injury. °¨ Strained muscles or cartilage due to frequent or repeated coughing or overwork. °¨ Fracture to one or more ribs. °¨ Sore cartilage due to inflammation (costochondritis). °RISK FACTORS  °Risk factors for chest pain may include: °· Activities that increase your risk for trauma or injury to your chest. °· Respiratory infections or conditions that cause frequent coughing. °· Medical conditions or overeating that can cause heartburn. °· Heart disease or family history of heart disease. °· Conditions or health behaviors that increase your risk of developing a blood clot. °· Having had chicken pox (varicella zoster). °SIGNS AND SYMPTOMS °Chest pain can feel like: °· Burning or tingling on the surface of your chest or deep in your chest. °· Crushing, pressure, aching, or squeezing pain. °· Dull or sharp pain that is worse when you move, cough, or take a deep breath. °· Pain that is also felt in your back, neck, shoulder, or arm, or pain that spreads to any of these areas. °Your chest pain may come and go, or it may stay  constant. °DIAGNOSIS °Lab tests or other studies may be needed to find the cause of your pain. Your health care provider may have you take a test called an ambulatory ECG (electrocardiogram). An ECG records your heartbeat patterns at the time the test is performed. You may also have other tests, such as: °· Transthoracic echocardiogram (TTE). During echocardiography, sound waves are used to create a picture of all of the heart structures and to look at how blood flows through your heart. °· Transesophageal echocardiogram (TEE). This is a more advanced imaging test that obtains images from inside your body. It allows your health care provider to see your heart in finer detail. °· Cardiac monitoring. This allows your health care provider to monitor your heart rate and rhythm in real time. °· Holter monitor. This is a portable device that records your heartbeat and can help to diagnose abnormal heartbeats. It allows your health care provider to track your heart activity for several days, if needed. °· Stress tests. These can be done through exercise or by taking medicine that makes your heart beat more quickly. °· Blood tests. °· Imaging tests. °TREATMENT  °Your treatment depends on what is causing your chest pain. Treatment may include: °· Medicines. These may include: °¨ Acid blockers for heartburn. °¨ Anti-inflammatory medicine. °¨ Pain medicine for inflammatory conditions. °¨ Antibiotic medicine, if an infection is present. °¨ Medicines to dissolve blood clots. °¨ Medicines to treat coronary artery disease. °· Supportive care for conditions that do not require medicines. This may include: °¨ Resting. °¨ Applying heat   or cold packs to injured areas. °¨ Limiting activities until pain decreases. °HOME CARE INSTRUCTIONS °· If you were prescribed an antibiotic medicine, finish it all even if you start to feel better. °· Avoid any activities that bring on chest pain. °· Do not use any tobacco products, including  cigarettes, chewing tobacco, or electronic cigarettes. If you need help quitting, ask your health care provider. °· Do not drink alcohol. °· Take medicines only as directed by your health care provider. °· Keep all follow-up visits as directed by your health care provider. This is important. This includes any further testing if your chest pain does not go away. °· If heartburn is the cause for your chest pain, you may be told to keep your head raised (elevated) while sleeping. This reduces the chance that acid will go from your stomach into your esophagus. °· Make lifestyle changes as directed by your health care provider. These may include: °¨ Getting regular exercise. Ask your health care provider to suggest some activities that are safe for you. °¨ Eating a heart-healthy diet. A registered dietitian can help you to learn healthy eating options. °¨ Maintaining a healthy weight. °¨ Managing diabetes, if necessary. °¨ Reducing stress. °SEEK MEDICAL CARE IF: °· Your chest pain does not go away after treatment. °· You have a rash with blisters on your chest. °· You have a fever. °SEEK IMMEDIATE MEDICAL CARE IF:  °· Your chest pain is worse. °· You have an increasing cough, or you cough up blood. °· You have severe abdominal pain. °· You have severe weakness. °· You faint. °· You have chills. °· You have sudden, unexplained chest discomfort. °· You have sudden, unexplained discomfort in your arms, back, neck, or jaw. °· You have shortness of breath at any time. °· You suddenly start to sweat, or your skin gets clammy. °· You feel nauseous or you vomit. °· You suddenly feel light-headed or dizzy. °· Your heart begins to beat quickly, or it feels like it is skipping beats. °These symptoms may represent a serious problem that is an emergency. Do not wait to see if the symptoms will go away. Get medical help right away. Call your local emergency services (911 in the [place] Do not drive yourself to the hospital. °  °This  information is not intended to replace advice given to you by your health care provider. Make sure you discuss any questions you have with your health care provider. °  °Document Released: 03/08/2005 Document Revised: 06/19/2014 Document Reviewed: 01/02/2014 °Elsevier Interactive Patient Education ©2016 [employer] °Cough, Adult °Coughing is a reflex that clears your throat and your airways. Coughing helps to heal and protect your lungs. It is normal to cough occasionally, but a cough that happens with other symptoms or lasts a long time may be a sign of a condition that needs treatment. A cough may last only 2-3 weeks (acute), or it may last longer than 8 weeks (chronic). °CAUSES °Coughing is commonly caused by: °· Breathing in substances that irritate your lungs. °· A viral or bacterial respiratory infection. °· Allergies. °· Asthma. °· Postnasal drip. °· Smoking. °· Acid backing up from the stomach into the esophagus (gastroesophageal reflux). °· Certain medicines. °· Chronic lung problems, including COPD (or rarely, lung cancer). °· Other medical conditions such as heart failure. °HOME CARE INSTRUCTIONS  °Pay attention to any changes in your symptoms. Take these actions to help with your discomfort: °· Take medicines only as told by your health care   provider. °· If you were prescribed an antibiotic medicine, take it as told by your health care provider. Do not stop taking the antibiotic even if you start to feel better. °· Talk with your health care provider before you take a cough suppressant medicine. °· Drink enough fluid to keep your urine clear or pale yellow. °· If the air is dry, use a cold steam vaporizer or humidifier in your bedroom or your home to help loosen secretions. °· Avoid anything that causes you to cough at work or at home. °· If your cough is worse at night, try sleeping in a semi-upright position. °· Avoid cigarette smoke. If you smoke, quit smoking. If you need help quitting, ask your  health care provider. °· Avoid caffeine. °· Avoid alcohol. °· Rest as needed. °SEEK MEDICAL CARE IF:  °· You have new symptoms. °· You cough up pus. °· Your cough does not get better after 2-3 weeks, or your cough gets worse. °· You cannot control your cough with suppressant medicines and you are losing sleep. °· You develop pain that is getting worse or pain that is not controlled with pain medicines. °· You have a fever. °· You have unexplained weight loss. °· You have night sweats. °SEEK IMMEDIATE MEDICAL CARE IF: °· You cough up blood. °· You have difficulty breathing. °· Your heartbeat is very fast. °  °This information is not intended to replace advice given to you by your health care provider. Make sure you discuss any questions you have with your health care provider. °  °Document Released: 11/25/2010 Document Revised: 02/17/2015 Document Reviewed: 08/05/2014 °Elsevier Interactive Patient Education ©2016 [employer] ° °

## 2016-09-27 ENCOUNTER — Emergency Department
Admission: EM | Admit: 2016-09-27 | Discharge: 2016-09-27 | Disposition: A | Discharge status: Home / Self Care | Payer: Self-pay

## 2017-02-09 ENCOUNTER — Emergency Department (HOSPITAL_COMMUNITY)
Admission: EM | Admit: 2017-02-09 | Discharge: 2017-02-09 | Disposition: A | Discharge status: Home / Self Care | Payer: BLUE CROSS/BLUE SHIELD | Attending: Emergency Medicine | Admitting: Emergency Medicine

## 2017-02-09 ENCOUNTER — Encounter (HOSPITAL_COMMUNITY): Payer: Self-pay | Admitting: *Deleted

## 2017-02-09 DIAGNOSIS — F17210 Nicotine dependence, cigarettes, uncomplicated: Secondary | ICD-10-CM | POA: Diagnosis not present

## 2017-02-09 DIAGNOSIS — Y929 Unspecified place or not applicable: Secondary | ICD-10-CM | POA: Diagnosis not present

## 2017-02-09 DIAGNOSIS — Y999 Unspecified external cause status: Secondary | ICD-10-CM | POA: Insufficient documentation

## 2017-02-09 DIAGNOSIS — Y939 Activity, unspecified: Secondary | ICD-10-CM | POA: Insufficient documentation

## 2017-02-09 DIAGNOSIS — M25562 Pain in left knee: Secondary | ICD-10-CM | POA: Insufficient documentation

## 2017-02-09 MED ORDER — NAPROXEN 375 MG PO TABS: 375.0000 mg | ORAL_TABLET | Freq: Two times a day (BID) | ORAL | 0 refills | Status: DC

## 2017-02-09 MED ORDER — ACETAMINOPHEN 325 MG PO TABS
650.0000 mg | ORAL_TABLET | Freq: Once | ORAL | Status: AC
  Administered 2017-02-09: 650 mg via ORAL
  Filled 2017-02-09: qty 2
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
## 2017-04-22 ENCOUNTER — Other Ambulatory Visit: Payer: Self-pay

## 2017-04-22 ENCOUNTER — Emergency Department: Payer: BLUE CROSS/BLUE SHIELD

## 2017-04-22 ENCOUNTER — Encounter: Payer: Self-pay | Admitting: Radiology

## 2017-04-22 ENCOUNTER — Emergency Department
Admission: EM | Admit: 2017-04-22 | Discharge: 2017-04-22 | Disposition: A | Discharge status: Home / Self Care | Payer: BLUE CROSS/BLUE SHIELD | Attending: Emergency Medicine | Admitting: Emergency Medicine

## 2017-04-22 DIAGNOSIS — K5900 Constipation, unspecified: Secondary | ICD-10-CM | POA: Diagnosis not present

## 2017-04-22 DIAGNOSIS — F17210 Nicotine dependence, cigarettes, uncomplicated: Secondary | ICD-10-CM | POA: Diagnosis not present

## 2017-04-22 DIAGNOSIS — R1012 Left upper quadrant pain: Secondary | ICD-10-CM | POA: Diagnosis present

## 2017-04-22 DIAGNOSIS — Z79899 Other long term (current) drug therapy: Secondary | ICD-10-CM | POA: Diagnosis not present

## 2017-04-22 LAB — COMPREHENSIVE METABOLIC PANEL
ALBUMIN: 4.5 g/dL (ref 3.5–5.0)
ALT: 30 U/L (ref 17–63)
ANION GAP: 8 (ref 5–15)
AST: 30 U/L (ref 15–41)
Alkaline Phosphatase: 97 U/L (ref 38–126)
BUN: 9 mg/dL (ref 6–20)
CHLORIDE: 99 mmol/L — AB (ref 101–111)
CO2: 27 mmol/L (ref 22–32)
Calcium: 9.5 mg/dL (ref 8.9–10.3)
Creatinine, Ser: 0.93 mg/dL (ref 0.61–1.24)
GFR calc Af Amer: >60 mL/min (ref >60)
GFR calc non Af Amer: >60 mL/min (ref >60)
GLUCOSE: 101 mg/dL — AB (ref 65–99)
POTASSIUM: 4.1 mmol/L (ref 3.5–5.1)
SODIUM: 134 mmol/L — AB (ref 135–145)
Total Bilirubin: 0.3 mg/dL (ref 0.3–1.2)
Total Protein: 9.3 g/dL — ABNORMAL HIGH (ref 6.5–8.1)

## 2017-04-22 LAB — CBC
HEMATOCRIT: 47.7 % (ref 40.0–52.0)
HEMOGLOBIN: 15.4 g/dL (ref 13.0–18.0)
MCH: 27.6 pg (ref 26.0–34.0)
MCHC: 32.3 g/dL (ref 32.0–36.0)
MCV: 85.5 fL (ref 80.0–100.0)
Platelets: 273 10*3/uL (ref 150–440)
RBC: 5.58 MIL/uL (ref 4.40–5.90)
RDW: 14.5 % (ref 11.5–14.5)
WBC: 11 10*3/uL — ABNORMAL HIGH (ref 3.8–10.6)

## 2017-04-22 LAB — URINALYSIS, COMPLETE (UACMP) WITH MICROSCOPIC
BACTERIA UA: NONE SEEN
BILIRUBIN URINE: NEGATIVE
Glucose, UA: NEGATIVE mg/dL
Hgb urine dipstick: NEGATIVE
KETONES UR: NEGATIVE mg/dL
LEUKOCYTES UA: NEGATIVE
Nitrite: NEGATIVE
PH: 5 (ref 5.0–8.0)
Protein, ur: NEGATIVE mg/dL
SPECIFIC GRAVITY, URINE: 1.002 — AB (ref 1.005–1.030)
SQUAMOUS EPITHELIAL / LPF: NONE SEEN

## 2017-04-22 LAB — LIPASE, BLOOD: LIPASE: 72 U/L — AB (ref 11–51)

## 2017-04-22 LAB — TROPONIN I

## 2017-04-22 MED ORDER — IOPAMIDOL (ISOVUE-300) INJECTION 61%
100.0000 mL | Freq: Once | INTRAVENOUS | Status: AC | PRN
  Administered 2017-04-22: 100 mL via INTRAVENOUS
  Filled 2017-04-22: qty 100

## 2017-04-22 NOTE — ED Provider Notes (Signed)
[facility] Emergency Department Provider Note  ____________________________________________  Time seen: Approximately 3:04 PM  I have reviewed the triage vital signs and the nursing notes.   HISTORY  Chief Complaint Abdominal Pain   HPI [place] is a 56 y.o. male with no PMH who presents for evaluation of abdominal pain. Patient reports that he has been having intermittent LUQ abdominal pain for 1 week. The pain is usually present after he eats, dull/pressure like in quality and moderate in intensity. He feels like he is not having BM as frequent as normal. Last BM was yesterday. No pain at this time. No nausea or vomiting, no fever or chills, no diarrhea, no melena, no dysuria, no frequency. No chest pain, no shortness of breath, no cough. No prior abdominal surgeries.Patient reports that a few days ago the pain woke him up from his sleep as it was very intense. He is also complaining of lightheadedness for the last week. He describes brief episodes where he feels like he is going to faint. No headache, no changes in vision, no slurred speech, no unilateral weakness or numbness, no vertigo.Patient denies NSAIDs or alcohol use.   History reviewed. No pertinent past medical history.  There are no active problems to display for this patient.   No past surgical history on file.  Prior to Admission medications   Medication Sig Start Date End Date Taking? Authorizing Provider  naproxen (NAPROSYN) 375 MG tablet Take 1 tablet (375 mg total) by mouth 2 (two) times daily with a meal. 02/09/17   Everlene Farrieransie, William, PA-C    Allergies Patient has no known allergies.  No family history on file.  Social History Social History   Tobacco Use  . Smoking status: Current Every Day Smoker    Packs/day: 1.00    Types: Cigarettes  Substance Use Topics  . Alcohol use: Yes  . Drug use: No    Review of Systems  Constitutional: Negative for fever. +  Lightheadedness Eyes: Negative for visual changes. ENT: Negative for sore throat. Neck: No neck pain  Cardiovascular: Negative for chest pain. Respiratory: Negative for shortness of breath. Gastrointestinal: + L sided abdominal pain. No vomiting or diarrhea. Genitourinary: Negative for dysuria. Musculoskeletal: Negative for back pain. Skin: Negative for rash. Neurological: Negative for headaches, weakness or numbness. Psych: No SI or HI  ____________________________________________   PHYSICAL EXAM:  VITAL SIGNS: ED Triage Vitals  Enc Vitals Group     BP 04/22/17 1427 (!) 152/83     Pulse Rate 04/22/17 1427 72     Resp 04/22/17 1427 18     Temp 04/22/17 1427 98.4 F (36.9 C)     Temp Source 04/22/17 1427 Oral     SpO2 04/22/17 1427 99 %     Weight 04/22/17 1427 185 lb (83.9 kg)     Height 04/22/17 1427 5\' 6"  (1.676 m)     Head Circumference --      Peak Flow --      Pain Score 04/22/17 1449 4     Pain Loc --      Pain Edu? --      Excl. in GC? --     Constitutional: Alert and oriented. Well appearing and in no apparent distress. HEENT:      Head: Normocephalic and atraumatic.         Eyes: Conjunctivae are normal. Sclera is non-icteric.       Mouth/Throat: Mucous membranes are moist.  Neck: Supple with no signs of meningismus. Cardiovascular: Regular rate and rhythm. No murmurs, gallops, or rubs. 2+ symmetrical distal pulses are present in all extremities. No JVD. Respiratory: Normal respiratory effort. Lungs are clear to auscultation bilaterally. No wheezes, crackles, or rhonchi.  Gastrointestinal: Soft, non tender, and non distended with positive bowel sounds. No rebound or guarding. Genitourinary: No CVA tenderness. Musculoskeletal: Nontender with normal range of motion in all extremities. No edema, cyanosis, or erythema of extremities. Neurologic: Normal speech and language. Face is symmetric. Moving all extremities. No gross focal neurologic deficits are  appreciated. Skin: Skin is warm, dry and intact. No rash noted. Psychiatric: Mood and affect are normal. Speech and behavior are normal.  ____________________________________________   LABS (all labs ordered are listed, but only abnormal results are displayed)  Labs Reviewed  LIPASE, BLOOD - Abnormal; Notable for the following components:      Result Value   Lipase 72 (*)    All other components within normal limits  COMPREHENSIVE METABOLIC PANEL - Abnormal; Notable for the following components:   Sodium 134 (*)    Chloride 99 (*)    Glucose, Bld 101 (*)    Total Protein 9.3 (*)    All other components within normal limits  CBC - Abnormal; Notable for the following components:   WBC 11.0 (*)    All other components within normal limits  URINALYSIS, COMPLETE (UACMP) WITH MICROSCOPIC - Abnormal; Notable for the following components:   Color, Urine STRAW (*)    APPearance CLEAR (*)    Specific Gravity, Urine 1.002 (*)    All other components within normal limits  TROPONIN I   ____________________________________________  EKG  ED ECG REPORT I, Nita Sickle, the attending physician, personally viewed and interpreted this ECG.  Normal sinus rhythm, rate of 63, normal intervals, normal axis, no ST elevations or depressions. Normal EKG. ____________________________________________  RADIOLOGY  KUB: negative   CT a/p: 1. No acute abdominopelvic findings. 2. Aortic Atherosclerosis (ICD10-I70.0). ____________________________________________   PROCEDURES  Procedure(s) performed: None Procedures Critical Care performed:  None ____________________________________________   INITIAL IMPRESSION / ASSESSMENT AND PLAN / ED COURSE  56 y.o. male with no PMH who presents for evaluation of intermittent episodes of LUQ abdominal pain worse post-prandially associated with lightheadedness. Patient is well-appearing, in no distress, has normal vital signs, abdomen is soft with no  tenderness throughout. EKG shows no evidence of ischemic changes. Differential diagnoses including gastritis versus GERD versus peptic ulcer disease versus pancreatitis versus gallbladder versus diverticulitis versus ACS. We'll check CBC, CMP, lipase, troponin. Patient has no symptoms at this time, will monitor.     _________________________ 4:38 PM on 04/22/2017 -----------------------------------------  Labs show a slightly elevated lipase therefore patient was sent for CT abdomen and pelvis to rule out pancreatic mass or gallstone pancreatitis. CT is negative for any acute findings. Patient does seem to have a moderate stool burden and is complaining of decreased bowel movements therefore we'll put him on a constipation regimen to see if his pain resolves. Patient's condition discharged home at this time with close follow-up with primary care doctor in [place] pain returns. Patient remains pain-free the emergency department and therefore I don't think patient has pancreatitis even though his lipase slightly elevated.   As part of my medical decision making, I reviewed the following data within the electronic MEDICAL RECORD NUMBER Nursing notes reviewed and incorporated, Labs reviewed , EKG interpreted , Radiograph reviewed , Notes from prior ED visits and [place] Controlled Substance  Database    Pertinent labs & imaging results that were available during my care of the patient were reviewed by me and considered in my medical decision making (see chart for details).    ____________________________________________   FINAL CLINICAL IMPRESSION(S) / ED DIAGNOSES  Final diagnoses:  Left upper quadrant pain  Constipation, unspecified constipation type      NEW MEDICATIONS STARTED DURING THIS VISIT:  This SmartLink is deprecated. Use AVSMEDLIST instead to display the medication list for a patient.   Note:  This document was prepared using Dragon voice recognition software and may include  unintentional dictation errors.    Nita SickleVeronese, [place], MD 04/22/17 [phone]

## 2017-04-22 NOTE — Discharge Instructions (Signed)
You have been seen in the Emergency Department (ED) for abdominal pain.  Your evaluation did not identify a clear cause of your symptoms but was generally reassuring. ° °Abdominal pain has many possible causes. Some aren't serious and get better on their own in a few days. Others need more testing and treatment. If your pain continues or gets worse, you need to be rechecked and may need more tests to find out what is wrong. You may need surgery to correct the problem.  ° °Follow up with your doctor in 12-24 hours if you are still having abdominal pain. Otherwise follow up in 1-3 days for a re-check ° °Constipation: Take colace twice a day everyday. Take senna once a day at bedtime. Take daily probiotics. Drink plenty of fluids and eat a diet rich in fiber. If you go more than 3 days without a bowel movement, take 1 cap full of Miralax in the morning and one in the evening up to 5 days.  ° ° °Don't ignore new symptoms, such as fever, nausea and vomiting, new or worsening abdominal pain, urination problems, bloody diarrhea or bloody stools, black tarry stools, uncontrollable nausea and vomiting, and dizziness. These may be signs of a more serious problem. If you develop any of these you should be seen by your doctor immediately or return to the ED. ° ° °How can you care for yourself at home?  °Rest until you feel better.  °To prevent dehydration, drink plenty of fluids, enough so that your urine is light yellow or clear like water. Choose water and other caffeine-free clear liquids until you feel better. If you have kidney, heart, or liver disease and have to limit fluids, talk with your doctor before you increase the amount of fluids you drink.  °If your stomach is upset, eat mild foods, such as rice, dry toast or crackers, bananas, and applesauce. Try eating several small meals instead of two or three large ones.  °Wait until 48 hours after all symptoms have gone away before you have spicy foods, alcohol, and drinks  that contain caffeine.  °Do not eat foods that are high in fat.  °Avoid anti-inflammatory medicines such as aspirin, ibuprofen (Advil, Motrin), and naproxen (Aleve). These can cause stomach upset. Talk to your doctor if you take daily aspirin for another health problem. ° °When should you call for help?  °Call 911 anytime you think you may need emergency care. For example, call if:  °You passed out (lost consciousness).  °You pass maroon or very bloody stools.  °You vomit blood or what looks like coffee grounds.  °You have new, severe belly pain. ° °Call your doctor now or seek immediate medical care if:  °Your pain gets worse, especially if it becomes focused in one area of your belly.  °You have a new or higher fever.  °Your stools are black and look like tar, or they have streaks of blood.  °You have unexpected vaginal bleeding.  °You have symptoms of a urinary tract infection. These may include:  °Pain when you urinate.  °Urinating more often than usual.  °Blood in your urine. °You are dizzy or lightheaded, or you feel like you may faint. °Watch closely for changes in your health, and be sure to contact your doctor if:  °You are not getting better after 1 day (24 hours). ° °

## 2017-04-22 NOTE — ED Triage Notes (Signed)
Pt presents to ED c/o left upper and lower abdominal pain x1 week. Described as sharp pain that wont' go away; pt has tried pepto bismol without relief. S/s worsen with food.

## 2017-11-03 ENCOUNTER — Emergency Department
Admission: EM | Admit: 2017-11-03 | Discharge: 2017-11-04 | Disposition: A | Discharge status: Home / Self Care | Payer: BLUE CROSS/BLUE SHIELD | Attending: Emergency Medicine | Admitting: Emergency Medicine

## 2017-11-03 ENCOUNTER — Encounter: Payer: Self-pay | Admitting: Emergency Medicine

## 2017-11-03 ENCOUNTER — Other Ambulatory Visit: Payer: Self-pay

## 2017-11-03 DIAGNOSIS — R45851 Suicidal ideations: Secondary | ICD-10-CM | POA: Insufficient documentation

## 2017-11-03 DIAGNOSIS — F17210 Nicotine dependence, cigarettes, uncomplicated: Secondary | ICD-10-CM | POA: Insufficient documentation

## 2017-11-03 DIAGNOSIS — F1010 Alcohol abuse, uncomplicated: Secondary | ICD-10-CM

## 2017-11-03 LAB — ETHANOL: ALCOHOL ETHYL (B): 22 mg/dL — AB (ref <10)

## 2017-11-03 LAB — COMPREHENSIVE METABOLIC PANEL
ALBUMIN: 3.9 g/dL (ref 3.5–5.0)
ALT: 41 U/L (ref 17–63)
ANION GAP: 10 (ref 5–15)
AST: 40 U/L (ref 15–41)
Alkaline Phosphatase: 95 U/L (ref 38–126)
BUN: 12 mg/dL (ref 6–20)
CALCIUM: 8.8 mg/dL — AB (ref 8.9–10.3)
CO2: 21 mmol/L — ABNORMAL LOW (ref 22–32)
Chloride: 103 mmol/L (ref 101–111)
Creatinine, Ser: 0.9 mg/dL (ref 0.61–1.24)
GFR calc Af Amer: >60 mL/min (ref >60)
Glucose, Bld: 136 mg/dL — ABNORMAL HIGH (ref 65–99)
POTASSIUM: 3.8 mmol/L (ref 3.5–5.1)
Sodium: 134 mmol/L — ABNORMAL LOW (ref 135–145)
TOTAL PROTEIN: 8 g/dL (ref 6.5–8.1)
Total Bilirubin: 0.4 mg/dL (ref 0.3–1.2)

## 2017-11-03 LAB — ACETAMINOPHEN LEVEL

## 2017-11-03 LAB — CBC
HCT: 46.5 % (ref 40.0–52.0)
Hemoglobin: 15.5 g/dL (ref 13.0–18.0)
MCH: 29.2 pg (ref 26.0–34.0)
MCHC: 33.3 g/dL (ref 32.0–36.0)
MCV: 87.6 fL (ref 80.0–100.0)
PLATELETS: 274 10*3/uL (ref 150–440)
RBC: 5.31 MIL/uL (ref 4.40–5.90)
RDW: 15 % — ABNORMAL HIGH (ref 11.5–14.5)
WBC: 10.1 10*3/uL (ref 3.8–10.6)

## 2017-11-03 LAB — SALICYLATE LEVEL

## 2017-11-03 MED ORDER — ADULT MULTIVITAMIN W/MINERALS CH
1.0000 | ORAL_TABLET | Freq: Every day | ORAL | Status: DC
  Administered 2017-11-03: 1 via ORAL
  Filled 2017-11-03: qty 1

## 2017-11-03 MED ORDER — FOLIC ACID 1 MG PO TABS
1.0000 mg | ORAL_TABLET | Freq: Every day | ORAL | Status: DC
  Administered 2017-11-03: 1 mg via ORAL
  Filled 2017-11-03: qty 1

## 2017-11-03 MED ORDER — VITAMIN B-1 100 MG PO TABS
100.0000 mg | ORAL_TABLET | Freq: Every day | ORAL | Status: DC
  Administered 2017-11-03: 100 mg via ORAL
  Filled 2017-11-03: qty 1

## 2017-11-03 NOTE — BH Assessment (Signed)
Writer discussed with the patient about the option for detox/treatment with [facility].  Patient was in the agreement with the plan.  Patient signed the Release of information, in order to send labs and ER Note.  Information sent sent to [facility] via fax and confirmed it was received ([phone])  Pt has been accepted and will be picked up/admitted on 11/04/17 at 1:00 am.
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
## 2017-11-03 NOTE — ED Provider Notes (Signed)
[facility] Emergency Department Provider Note   ____________________________________________   First MD Initiated Contact with Patient 11/03/17 [id number]     (approximate)  I have reviewed the triage vital signs and the nursing notes.   HISTORY  Chief Complaint Suicidal and Alcohol Problem    HPI Clinton Knight is a 57 y.o. male Patient tells me he's been drinking heavily and wants to stop. He can't get anybody to help him stop. He went to social services 2 weeks ago to asked for treatment for them to help him get treatment and he said refused. Patient cannot tell me much she's been drinking drinking but says he's been drinking liquor and beer. He has intermittently thought of hurting himself in his thinking of hurting himself today although he cannot tell me his plan. he is not dizzy currently.   History reviewed. No pertinent past medical history.  There are no active problems to display for this patient.   History reviewed. No pertinent surgical history.  Prior to Admission medications   Medication Sig Start Date End Date Taking? Authorizing Provider  naproxen (NAPROSYN) 375 MG tablet Take 1 tablet (375 mg total) by mouth 2 (two) times daily with a meal. 02/09/17   Everlene Farrier, PA-C    Allergies Patient has no known allergies.  History reviewed. No pertinent family history.  Social History Social History   Tobacco Use  . Smoking status: Current Every Day Smoker    Packs/day: 1.00    Types: Cigarettes  . Smokeless tobacco: Never Used  Substance Use Topics  . Alcohol use: Yes  . Drug use: No    Review of Systems  Constitutional: No fever/chills Eyes: No visual changes. ENT: No sore throat. Cardiovascular: Denies chest pain. Respiratory: Denies shortness of breath. Gastrointestinal: No abdominal pain.  No nausea, no vomiting.  No diarrhea.  No constipation. Genitourinary: Negative for dysuria. Musculoskeletal: Negative for back  pain. Skin: Negative for rash. Neurological: Negative for headaches, focal weakness   ____________________________________________   PHYSICAL EXAM:  VITAL SIGNS: ED Triage Vitals  Enc Vitals Group     BP 11/03/17 0902 111/71     Pulse Rate 11/03/17 0902 70     Resp 11/03/17 0902 18     Temp 11/03/17 0902 98.4 F (36.9 C)     Temp Source 11/03/17 0902 Oral     SpO2 11/03/17 0902 99 %     Weight 11/03/17 0836 180 lb (81.6 kg)     Height 11/03/17 0836  (1.676 m)     Head Circumference --      Peak Flow --      Pain Score 11/03/17 0836 0     Pain Loc --      Pain Edu? --      Excl. in GC? --     Constitutional: Alert and oriented. Well appearing and in no acute distress. Eyes: Conjunctivae are normal. PER. EOMI. Head: Atraumatic. Nose: No congestion/rhinnorhea. Mouth/Throat: Mucous membranes are moist.  Oropharynx non-erythematous. Neck: No stridor.   Cardiovascular: Normal rate, regular rhythm. Grossly normal heart sounds.  Good peripheral circulation. Respiratory: Normal respiratory effort.  No retractions. Lungs CTAB. Gastrointestinal: Soft and nontender. No distention. No abdominal bruits. No CVA tenderness. Musculoskeletal: No lower extremity tenderness nor edema.   Neurologic:  Normal speech and language. No gross focal neurologic deficits are appreciated. Skin:  Skin is warm, dry and intact. No rash noted. Psychiatric: Mood and affect are normal. Speech and behavior are  normal.  ____________________________________________   LABS (all labs ordered are listed, but only abnormal results are displayed)  Labs Reviewed  COMPREHENSIVE METABOLIC PANEL - Abnormal; Notable for the following components:      Result Value   Sodium 134 (*)    CO2 21 (*)    Glucose, Bld 136 (*)    Calcium 8.8 (*)    All other components within normal limits  ETHANOL - Abnormal; Notable for the following components:   Alcohol, Ethyl (B) 22 (*)    All other components within normal  limits  ACETAMINOPHEN LEVEL - Abnormal; Notable for the following components:   Acetaminophen (Tylenol), Serum <10 (*)    All other components within normal limits  CBC - Abnormal; Notable for the following components:   RDW 15.0 (*)    All other components within normal limits  SALICYLATE LEVEL  URINE DRUG SCREEN, QUALITATIVE ([facility] ONLY)   ____________________________________________  EKG   ____________________________________________  RADIOLOGY  ED MD interpretation:    Official radiology report(s): No results found.  ____________________________________________   PROCEDURES  Procedure(s) performed:   Procedures  Critical Care performed:   ____________________________________________   INITIAL IMPRESSION / ASSESSMENT AND PLAN / ED COURSE  patella psych sees the patient and agrees he should go to alcohol detox we will send him to [facility]. Tele-psych does not feel he needs to be inpatient psychiatry.I agree.        ____________________________________________   FINAL CLINICAL IMPRESSION(S) / ED DIAGNOSES  Final diagnoses:  Alcohol abuse     ED Discharge Orders    None       Note:  This document was prepared using Dragon voice recognition software and may include unintentional dictation errors.    Arnaldo Natal, MD 11/03/17 1409

## 2017-11-03 NOTE — BH Assessment (Signed)
Pt's Consent to Release Information form has been placed in his chart.

## 2017-11-03 NOTE — BH Assessment (Signed)
Pt informed that [facility] will be unable to pick him up after [facility]TS followed up with Jeri. Pt visibly upset, unable to decide if he wants to go to [facility] or if he is able to find transport.

## 2017-11-03 NOTE — ED Triage Notes (Addendum)
Pt to ed with c/o dizziness x 2 days.  Pt denies injury. Denies all other symptoms.  Pt states he feels dizzy when he drinks alcohol. Reports he actually came to ED for help with alcohol.  States he drank alcohol this morning and generally drinks a large amount everyday. Reports he "sometimes" has thoughts of wanting to hurt himself.  Pt states "I need help for my alcoholism but no one wants to help me".  Pt also admits to wanting to hurt others if "they get up in my face".  Pt states he has felt depressed for several weeks.

## 2017-11-03 NOTE — ED Notes (Signed)
Pt cooperative with VS and blood draw. Pt states he needs help with alcohol detox.   Pt reports his drinking has been heavier for the past month. Passive SI. Denies HI and AVH. Maintained on 15 minute checks and observation by security camera for safety.

## 2017-11-03 NOTE — Discharge Instructions (Addendum)
please go with the people from [facility]. They will help you with your detox and treatment.

## 2017-11-03 NOTE — ED Notes (Signed)
Patient moved to [facility].

## 2017-11-04 NOTE — ED Notes (Signed)
Patient received PM snack. 

## 2017-11-04 NOTE — ED Provider Notes (Signed)
-----------------------------------------   6:23 AM on 11/04/2017 -----------------------------------------   Blood pressure 111/71, pulse 70, temperature 98.4 F (36.9 C), temperature source Oral, resp. rate 18, height  (1.676 m), weight 81.6 kg (180 lb), SpO2 99 %.  The patient had no acute events since last update.  Sleeping at this time.  TTS counselor tells me there is a better [facility] but [facility] does not have transport this holiday weekend.  Patient has someone he will call later this morning to take him to [facility].    Irean Hong, MD 11/04/17 [phone]

## 2018-03-19 ENCOUNTER — Emergency Department: Payer: Self-pay

## 2018-03-19 ENCOUNTER — Other Ambulatory Visit: Payer: Self-pay

## 2018-03-19 ENCOUNTER — Inpatient Hospital Stay
Admission: EM | Admit: 2018-03-19 | Discharge: 2018-03-22 | DRG: 065 | Disposition: A | Discharge status: Rehabilitation Facility | Payer: Self-pay | Attending: Internal Medicine | Admitting: Internal Medicine

## 2018-03-19 ENCOUNTER — Encounter: Payer: Self-pay | Admitting: Radiology

## 2018-03-19 DIAGNOSIS — F17210 Nicotine dependence, cigarettes, uncomplicated: Secondary | ICD-10-CM | POA: Diagnosis present

## 2018-03-19 DIAGNOSIS — G8194 Hemiplegia, unspecified affecting left nondominant side: Secondary | ICD-10-CM | POA: Diagnosis present

## 2018-03-19 DIAGNOSIS — I129 Hypertensive chronic kidney disease with stage 1 through stage 4 chronic kidney disease, or unspecified chronic kidney disease: Secondary | ICD-10-CM | POA: Diagnosis present

## 2018-03-19 DIAGNOSIS — R471 Dysarthria and anarthria: Secondary | ICD-10-CM | POA: Diagnosis present

## 2018-03-19 DIAGNOSIS — N189 Chronic kidney disease, unspecified: Secondary | ICD-10-CM | POA: Diagnosis present

## 2018-03-19 DIAGNOSIS — E785 Hyperlipidemia, unspecified: Secondary | ICD-10-CM | POA: Diagnosis present

## 2018-03-19 DIAGNOSIS — I6340 Cerebral infarction due to embolism of unspecified cerebral artery: Principal | ICD-10-CM | POA: Diagnosis present

## 2018-03-19 DIAGNOSIS — E1151 Type 2 diabetes mellitus with diabetic peripheral angiopathy without gangrene: Secondary | ICD-10-CM | POA: Diagnosis present

## 2018-03-19 DIAGNOSIS — Z833 Family history of diabetes mellitus: Secondary | ICD-10-CM

## 2018-03-19 DIAGNOSIS — R260 Ataxic gait: Secondary | ICD-10-CM | POA: Diagnosis present

## 2018-03-19 DIAGNOSIS — E781 Pure hyperglyceridemia: Secondary | ICD-10-CM | POA: Diagnosis present

## 2018-03-19 DIAGNOSIS — Z8249 Family history of ischemic heart disease and other diseases of the circulatory system: Secondary | ICD-10-CM

## 2018-03-19 DIAGNOSIS — E1165 Type 2 diabetes mellitus with hyperglycemia: Secondary | ICD-10-CM | POA: Diagnosis not present

## 2018-03-19 DIAGNOSIS — R4781 Slurred speech: Secondary | ICD-10-CM | POA: Diagnosis present

## 2018-03-19 DIAGNOSIS — E1122 Type 2 diabetes mellitus with diabetic chronic kidney disease: Secondary | ICD-10-CM | POA: Diagnosis present

## 2018-03-19 DIAGNOSIS — I639 Cerebral infarction, unspecified: Secondary | ICD-10-CM

## 2018-03-19 DIAGNOSIS — I441 Atrioventricular block, second degree: Secondary | ICD-10-CM | POA: Diagnosis present

## 2018-03-19 DIAGNOSIS — I455 Other specified heart block: Secondary | ICD-10-CM

## 2018-03-19 DIAGNOSIS — R29707 NIHSS score 7: Secondary | ICD-10-CM | POA: Diagnosis present

## 2018-03-19 DIAGNOSIS — Z823 Family history of stroke: Secondary | ICD-10-CM

## 2018-03-19 DIAGNOSIS — D6869 Other thrombophilia: Secondary | ICD-10-CM | POA: Diagnosis present

## 2018-03-19 DIAGNOSIS — E876 Hypokalemia: Secondary | ICD-10-CM | POA: Diagnosis present

## 2018-03-19 DIAGNOSIS — R29810 Facial weakness: Secondary | ICD-10-CM | POA: Diagnosis present

## 2018-03-19 DIAGNOSIS — I6529 Occlusion and stenosis of unspecified carotid artery: Secondary | ICD-10-CM | POA: Diagnosis present

## 2018-03-19 DIAGNOSIS — Z6832 Body mass index (BMI) 32.0-32.9, adult: Secondary | ICD-10-CM

## 2018-03-19 DIAGNOSIS — Z8673 Personal history of transient ischemic attack (TIA), and cerebral infarction without residual deficits: Secondary | ICD-10-CM

## 2018-03-19 HISTORY — DX: Essential (primary) hypertension: I10

## 2018-03-19 HISTORY — DX: Cocaine abuse, uncomplicated: F14.10

## 2018-03-19 HISTORY — DX: Disorder of kidney and ureter, unspecified: N28.9

## 2018-03-19 HISTORY — DX: Pure hyperglyceridemia: E78.1

## 2018-03-19 HISTORY — DX: Cerebral infarction, unspecified: I63.9

## 2018-03-19 HISTORY — DX: Type 2 diabetes mellitus without complications: E11.9

## 2018-03-19 HISTORY — DX: Tobacco use: Z72.0

## 2018-03-19 HISTORY — DX: Alcohol abuse, uncomplicated: F10.10

## 2018-03-19 LAB — DIFFERENTIAL
ABS IMMATURE GRANULOCYTES: 0.03 10*3/uL (ref 0.00–0.07)
BASOS ABS: 0.1 10*3/uL (ref 0.0–0.1)
BASOS PCT: 1 %
Eosinophils Absolute: 0.3 10*3/uL (ref 0.0–0.5)
Eosinophils Relative: 3 %
Immature Granulocytes: 0 %
Lymphocytes Relative: 43 %
Lymphs Abs: 4.4 10*3/uL — ABNORMAL HIGH (ref 0.7–4.0)
MONO ABS: 0.8 10*3/uL (ref 0.1–1.0)
Monocytes Relative: 8 %
NEUTROS ABS: 4.8 10*3/uL (ref 1.7–7.7)
NEUTROS PCT: 45 %

## 2018-03-19 LAB — COMPREHENSIVE METABOLIC PANEL
ALT: 30 U/L (ref 0–44)
AST: 31 U/L (ref 15–41)
Albumin: 3.9 g/dL (ref 3.5–5.0)
Alkaline Phosphatase: 99 U/L (ref 38–126)
Anion gap: 11 (ref 5–15)
BUN: 13 mg/dL (ref 6–20)
CHLORIDE: 104 mmol/L (ref 98–111)
CO2: 27 mmol/L (ref 22–32)
CREATININE: 1.11 mg/dL (ref 0.61–1.24)
Calcium: 9.4 mg/dL (ref 8.9–10.3)
GFR calc non Af Amer: >60 mL/min (ref >60)
Glucose, Bld: 104 mg/dL — ABNORMAL HIGH (ref 70–99)
POTASSIUM: 3.8 mmol/L (ref 3.5–5.1)
SODIUM: 142 mmol/L (ref 135–145)
Total Bilirubin: 0.5 mg/dL (ref 0.3–1.2)
Total Protein: 7.6 g/dL (ref 6.5–8.1)

## 2018-03-19 LAB — CBC
HCT: 46.8 % (ref 39.0–52.0)
HEMOGLOBIN: 15 g/dL (ref 13.0–17.0)
MCH: 28.2 pg (ref 26.0–34.0)
MCHC: 32.1 g/dL (ref 30.0–36.0)
MCV: 88.1 fL (ref 80.0–100.0)
NRBC: 0 % (ref 0.0–0.2)
PLATELETS: 229 10*3/uL (ref 150–400)
RBC: 5.31 MIL/uL (ref 4.22–5.81)
RDW: 14.1 % (ref 11.5–15.5)
WBC: 10.3 10*3/uL (ref 4.0–10.5)

## 2018-03-19 LAB — GLUCOSE, CAPILLARY: Glucose-Capillary: 98 mg/dL (ref 70–99)

## 2018-03-19 LAB — APTT: APTT: 25 s (ref 24–36)

## 2018-03-19 LAB — PROTIME-INR
INR: 0.87
Prothrombin Time: 11.7 seconds (ref 11.4–15.2)

## 2018-03-19 LAB — TROPONIN I

## 2018-03-19 MED ORDER — ACETAMINOPHEN 650 MG RE SUPP
RECTAL | Status: AC
  Filled 2018-03-19: qty 1

## 2018-03-19 MED ORDER — ACETAMINOPHEN 650 MG RE SUPP
650.0000 mg | Freq: Once | RECTAL | Status: AC
  Administered 2018-03-19: 650 mg via RECTAL

## 2018-03-19 MED ORDER — ASPIRIN 300 MG RE SUPP
300.0000 mg | Freq: Once | RECTAL | Status: AC
  Administered 2018-03-19: 300 mg via RECTAL
  Filled 2018-03-19: qty 1

## 2018-03-19 MED ORDER — IOPAMIDOL (ISOVUE-370) INJECTION 76%
100.0000 mL | Freq: Once | INTRAVENOUS | Status: AC | PRN
  Administered 2018-03-19: 100 mL via INTRAVENOUS

## 2018-03-19 MED ORDER — ASPIRIN 81 MG PO CHEW: 324.0000 mg | CHEWABLE_TABLET | Freq: Once | ORAL | Status: DC

## 2018-03-19 MED ORDER — ACETAMINOPHEN 650 MG RE SUPP: 975.0000 mg | Freq: Once | RECTAL | Status: DC

## 2018-03-19 NOTE — ED Notes (Signed)
MRI called by Delman Kitten to inquire about MRI for patient and how long before they can come and get pt. MRI stated 30-45 minutes. MD informed.

## 2018-03-19 NOTE — ED Notes (Signed)
This RN trying to update Neurologist on patient and time frame from triage notes. Pt unable to provide times for neurologist. RN Carollee Herter called to send family back so we can get adequate times.

## 2018-03-19 NOTE — ED Notes (Signed)
CT called and informed of needing to get test completed.

## 2018-03-19 NOTE — ED Notes (Signed)
Pt informed of still needing urine when able

## 2018-03-19 NOTE — ED Notes (Signed)
Pt cleared by MD Malinda for CT scan

## 2018-03-19 NOTE — ED Notes (Signed)
Pt unable to drink water at this time. Pt started coughing and seemed to get choked up. [facility] Swallow screen FAILED. Will inform MD
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
## 2018-03-19 NOTE — ED Notes (Signed)
RN Kimmie given patient information at this time

## 2018-03-19 NOTE — ED Notes (Signed)
Pt given urinal for urine sample 

## 2018-03-19 NOTE — ED Notes (Signed)
MD Darnelle Catalan at bedside speaking with neurologist. Per Neurologist get CT Angio Head and Neck completed.

## 2018-03-19 NOTE — ED Notes (Signed)
BS 98

## 2018-03-19 NOTE — ED Notes (Signed)
Pt in CT.

## 2018-03-19 NOTE — ED Triage Notes (Signed)
Pt to ED reporting sudden onset of weakness, slurred speech and confusion 1 hour ago. Pt is diaphoretic.

## 2018-03-19 NOTE — ED Notes (Signed)
Pt states he got weak and his head started hurting and he stopped eating. Pt slurring some with speech. Pt tried to ambulate to car and states his left side gave out.

## 2018-03-19 NOTE — ED Notes (Addendum)
Pt slurring some speech at times and states he just cant talk. Family at bedside

## 2018-03-19 NOTE — ED Notes (Addendum)
Family at bedside speaking with Neurologist Baluja at this time. Pt upset and unable to communicate with neurologist.  Pt's sister at bedside speaking with neurologist and informing him of the symptoms and time frame that everything occurred. Pt's sister states this occurred at 5:30 pm.

## 2018-03-19 NOTE — ED Provider Notes (Signed)
[facility] Emergency Department Provider Note   ____________________________________________   First MD Initiated Contact with Patient 03/19/18 1913     (approximate)  I have reviewed the triage vital signs and the nursing notes.   HISTORY  Chief Complaint Code Stroke    HPI Clinton Knight is a 57 y.o. male patient reports he got weak and then he started having headaches began slurring his speech and left side of his body gave out.  Here in the emergency room patient speaking fairly well he does have some ataxia on the left side slight weakness code stroke is called CT shows 3 old strokes but nothing new  History reviewed. No pertinent past medical history.  There are no active problems to display for this patient.   No past surgical history on file.  Prior to Admission medications   Not on File    Allergies Patient has no known allergies.  No family history on file.  Social History Social History   Tobacco Use  . Smoking status: Current Every Day Smoker    Packs/day: 1.00    Types: Cigarettes  . Smokeless tobacco: Never Used  Substance Use Topics  . Alcohol use: Yes  . Drug use: No    Review of Systems  Constitutional: No fever/chills Eyes: No visual changes. ENT: No sore throat. Cardiovascular: Denies chest pain. Respiratory: Denies shortness of breath. Gastrointestinal: No abdominal pain.  No nausea, no vomiting.  No diarrhea.  No constipation. Genitourinary: Negative for dysuria. Musculoskeletal: Negative for back pain. Skin: Negative for rash. Neurological: See HPI  ____________________________________________   PHYSICAL EXAM:  VITAL SIGNS: ED Triage Vitals  Enc Vitals Group     BP 03/19/18 1907 127/75     Pulse Rate 03/19/18 1907 73     Resp 03/19/18 1907 16     Temp 03/19/18 1907 98.3 F (36.8 C)     Temp Source 03/19/18 1907 Oral     SpO2 03/19/18 1907 100 %     Weight 03/19/18 1917 199 lb 11.8 oz (90.6  kg)     Height 03/19/18 1918 5\' 6"  (1.676 m)     Head Circumference --      Peak Flow --      Pain Score 03/19/18 1929 5     Pain Loc --      Pain Edu? --      Excl. in GC? --    Constitutional: Alert and oriented.  Looks anxious Eyes: Conjunctivae are normal. PERRL. EOMI. Head: Atraumatic. Nose: No congestion/rhinnorhea. Mouth/Throat: Mucous membranes are moist.  Oropharynx non-erythematous. Neck: No stridor.  Cardiovascular: Normal rate, regular rhythm. Grossly normal heart sounds.  Good peripheral circulation. Respiratory: Normal respiratory effort.  No retractions. Lungs CTAB. Gastrointestinal: Soft and nontender. No distention. No abdominal bruits. No CVA tenderness. Musculoskeletal: No lower extremity tenderness nor edema.  Neurologic: Speech seems normal at present.  Cranial nerves II through XII appear to be intact although patient does then failed a swallow screen.  Cerebellar seems slightly ataxic on the left.  Motor strength is slightly decreased on the left especially in the left leg. Skin:  Skin is warm, dry and intact. No rash noted. Psychiatric: Mood and affect are normal. Speech and behavior are normal.  ____________________________________________   LABS (all labs ordered are listed, but only abnormal results are displayed)  Labs Reviewed  DIFFERENTIAL - Abnormal; Notable for the following components:      Result Value   Lymphs Abs 4.4 (*)  All other components within normal limits  COMPREHENSIVE METABOLIC PANEL - Abnormal; Notable for the following components:   Glucose, Bld 104 (*)    All other components within normal limits  PROTIME-INR  APTT  CBC  TROPONIN I  GLUCOSE, CAPILLARY  URINE DRUG SCREEN, QUALITATIVE ([facility] ONLY)  URINALYSIS, ROUTINE W REFLEX MICROSCOPIC  CBG MONITORING, ED   ____________________________________________  EKG EKG read and interpreted by me shows sinus bradycardia at 53 normal axis no acute ST-T wave  changes ____________________________________________  RADIOLOGY  ED MD interpretation: CT read by radiology reviewed by me shows no acute changes  Official radiology report(s): Mr Brain Wo Contrast  Result Date: 03/19/2018 CLINICAL DATA:  Headache and left-sided weakness EXAM: MRI HEAD WITHOUT CONTRAST TECHNIQUE: Multiplanar, multiecho pulse sequences of the brain and surrounding structures were obtained without intravenous contrast. COMPARISON:  Head CT from earlier today FINDINGS: Brain: Acute infarct in the right pons, respecting midline. Although signal is weak, there is definite restricted diffusion. Remote lateral lenticulostriate infarcts bilaterally, affecting putamen and deep white matter tracts. Mild chronic small vessel ischemic gliosis in the cerebral white matter. Small remote left cerebellar infarct. No acute hemorrhage, hydrocephalus, or collection. Vascular: Major flow voids are preserved Skull and upper cervical spine: No evidence of marrow lesion Sinuses/Orbits: Negative IMPRESSION: 1. Acute infarct in the right pons. 2. Remote basal ganglia and left cerebellar infarcts. Electronically Signed   By: Marnee Spring M.D.   On: 03/19/2018 21:41   Ct Head Code Stroke Wo Contrast  Result Date: 03/19/2018 CLINICAL DATA:  Code stroke. Sudden onset of weakness and slurred speech beginning 1 hour ago EXAM: CT HEAD WITHOUT CONTRAST TECHNIQUE: Contiguous axial images were obtained from the base of the skull through the vertex without intravenous contrast. COMPARISON:  None. FINDINGS: Brain: No evidence of acute infarction, hemorrhage, hydrocephalus, extra-axial collection or mass lesion/mass effect. Remote appearing bilateral basal ganglia infarcts. Small remote left cerebellar infarct. Vascular: Atherosclerotic calcification.  No hyperdense vessel. Skull: Negative Sinuses/Orbits: Negative Other: These results were called by telephone at the time of interpretation on 03/19/2018 at 7:23 pm to Dr.  Darnelle Catalan, who verbally acknowledged these results. ASPECTS [facility] Stroke Program Early CT Score) -when accounting for chronic infarct. - Ganglionic level infarction (caudate, lentiform nuclei, internal capsule, insula, M1-M3 cortex): 7 - Supraganglionic infarction (M4-M6 cortex): 3 Total score (0-10 with 10 being normal): 10 IMPRESSION: 1. No acute finding. 2. Remote bilateral basal ganglia and left cerebellar infarcts. Electronically Signed   By: Marnee Spring M.D.   On: 03/19/2018 19:25    ____________________________________________   PROCEDURES  Procedure(s) performed:   Procedures  Critical Care performed:   ____________________________________________   INITIAL IMPRESSION / ASSESSMENT AND PLAN / ED COURSE  Tele-neurology recommends no TPA at present does recommend aspirin.  Since he failed the swallow screen will do rectal aspirin.  We will work on getting the MRI done ASAP.          MRI shows acute stroke in the pons patient is followed and failed his swallow screen will recontact neurology quickly  Neurology recontacted by that time is 20 minutes after the hour patient is no longer a candidate for TPA.  He did fail a swallow screen but he actually seems to be slightly better again.  He had worsened about an hour after neurology saw him slightly at least I thought so.  He is now back to the way he had been close to it when neurology saw him Clinical Course as of  Mar 19 2299  Tue Mar 19, 2018  2043 AST: 31 [PM]    Clinical Course User Index [PM] Arnaldo Natal, MD     ____________________________________________   FINAL CLINICAL IMPRESSION(S) / ED DIAGNOSES  Final diagnoses:  Cerebral infarction, unspecified mechanism [facility])     ED Discharge Orders    None       Note:  This document was prepared using Dragon voice recognition software and may include unintentional dictation errors.    Arnaldo Natal, MD 03/19/18 2302

## 2018-03-19 NOTE — ED Notes (Addendum)
Neurologist called at this time. RN Amy given patient information. Per MD Darnelle Catalan he wants pt looked at again by neurologist and MRI results to be viewed as well. RN Amy informed of prior stroke called and the neurologist that assessed patient earlier. RN Amy states she will contact Sebesto MD again.

## 2018-03-19 NOTE — ED Notes (Signed)
Patient transported to MRI 

## 2018-03-19 NOTE — ED Notes (Signed)
MRI called and screening patient at this time for exam

## 2018-03-19 NOTE — ED Notes (Signed)
Neurologist performing exam at this time.

## 2018-03-19 NOTE — ED Notes (Signed)
Pt back from MRI 

## 2018-03-19 NOTE — H&P (Signed)
[facility] [place] [facility]   PATIENT NAME: Clinton Knight    MR#:  [id number]  DATE OF BIRTH:  November 20, 1960  DATE OF ADMISSION:  03/19/2018  PRIMARY CARE PHYSICIAN: Patient, No Pcp Per   REQUESTING/REFERRING PHYSICIAN: Darnelle Catalan, MD  CHIEF COMPLAINT:   Chief Complaint  Patient presents with  . Code Stroke    HISTORY OF PRESENT ILLNESS:  Clinton Knight  is a 57 y.o. male who presents with chief complaint as above.  She had sudden onset discomfort behind his right eye and slurring of speech, followed promptly by left-sided weakness and sensory deficit.  He came to ED for evaluation and was found here in MRI to have an acute stroke.  Hospitalist were called for admission  PAST MEDICAL HISTORY:   Past Medical History:  Diagnosis Date  . Stroke [facility])      PAST SURGICAL HISTORY:   Past Surgical History:  Procedure Laterality Date  . NO PAST SURGERIES       SOCIAL HISTORY:   Social History   Tobacco Use  . Smoking status: Current Every Day Smoker    Packs/day: 1.00    Types: Cigarettes  . Smokeless tobacco: Never Used  Substance Use Topics  . Alcohol use: Yes     FAMILY HISTORY:  Family history reviewed and is non-contributory   DRUG ALLERGIES:  No Known Allergies  MEDICATIONS AT HOME:   Prior to Admission medications   Not on File    REVIEW OF SYSTEMS:  Review of Systems  Constitutional: Negative for chills, fever, malaise/fatigue and weight loss.  HENT: Negative for ear pain, hearing loss and tinnitus.   Eyes: Positive for pain. Negative for blurred vision, double vision and redness.  Respiratory: Negative for cough, hemoptysis and shortness of breath.   Cardiovascular: Negative for chest pain, palpitations, orthopnea and leg swelling.  Gastrointestinal: Negative for abdominal pain, constipation, diarrhea, nausea and vomiting.  Genitourinary: Negative for dysuria, frequency and hematuria.  Musculoskeletal: Negative for back  pain, joint pain and neck pain.  Skin:       No acne, rash, or lesions  Neurological: Positive for sensory change, speech change and focal weakness. Negative for dizziness, tremors and weakness.  Endo/Heme/Allergies: Negative for polydipsia. Does not bruise/bleed easily.  Psychiatric/Behavioral: Negative for depression. The patient is not nervous/anxious and does not have insomnia.      VITAL SIGNS:   Vitals:   03/19/18 1930 03/19/18 2030 03/19/18 2212 03/19/18 2230  BP: (!) 151/77 (!) 167/50 (!) 149/75 (!) 156/88  Pulse: 62 (!) 55 63 (!) 51  Resp: 18 (!) 22 15 17   Temp:      TempSrc:      SpO2: 100% 100% 96% 100%  Weight:      Height:       Wt Readings from Last 3 Encounters:  03/19/18 90.6 kg  11/03/17 81.6 kg  04/22/17 83.9 kg    PHYSICAL EXAMINATION:  Physical Exam  Vitals reviewed. Constitutional: He is oriented to person, place, and time. He appears well-developed and well-nourished. No distress.  HENT:  Head: Normocephalic and atraumatic.  Mouth/Throat: Oropharynx is clear and moist.  Eyes: Pupils are equal, round, and reactive to light. Conjunctivae and EOM are normal. No scleral icterus.  Neck: Normal range of motion. Neck supple. No JVD present. No thyromegaly present.  Cardiovascular: Normal rate, regular rhythm and intact distal pulses. Exam reveals no gallop and no friction rub.  No murmur heard. Respiratory: Effort normal and breath  sounds normal. No respiratory distress. He has no wheezes. He has no rales.  GI: Soft. Bowel sounds are normal. He exhibits no distension. There is no tenderness.  Musculoskeletal: Normal range of motion. He exhibits no edema.  No arthritis, no gout  Lymphadenopathy:    He has no cervical adenopathy.  Neurological: He is alert and oriented to person, place, and time. No cranial nerve deficit.  Neurologic: Cranial nerves II-XII intact, Sensation intact to light touch/pinprick everywhere except for left extremities which have  decreased sensation to light touch, 5/5 strength in right extremities, 4/5 strength left upper extremity with 3/5 strength left lower extremity, + dysarthria, no aphasia, + dysphagia, memory intact  Skin: Skin is warm and dry. No rash noted. No erythema.  Psychiatric: He has a normal mood and affect. His behavior is normal. Judgment and thought content normal.    LABORATORY PANEL:   CBC Recent Labs  Lab 03/19/18 1921  WBC 10.3  HGB 15.0  HCT 46.8  PLT 229   ------------------------------------------------------------------------------------------------------------------  Chemistries  Recent Labs  Lab 03/19/18 1921  NA 142  K 3.8  CL 104  CO2 27  GLUCOSE 104*  BUN 13  CREATININE 1.11  CALCIUM 9.4  AST 31  ALT 30  ALKPHOS 99  BILITOT 0.5   ------------------------------------------------------------------------------------------------------------------  Cardiac Enzymes Recent Labs  Lab 03/19/18 1921  TROPONINI <0.03   ------------------------------------------------------------------------------------------------------------------  RADIOLOGY:  Ct Angio Head W Or Wo Contrast  Result Date: 03/19/2018 CLINICAL DATA:  Weakness, slurred speech and confusion. EXAM: CT ANGIOGRAPHY HEAD AND NECK CT PERFUSION BRAIN TECHNIQUE: Multidetector CT imaging of the head and neck was performed using the standard protocol during bolus administration of intravenous contrast. Multiplanar CT image reconstructions and MIPs were obtained to evaluate the vascular anatomy. Carotid stenosis measurements (when applicable) are obtained utilizing NASCET criteria, using the distal internal carotid diameter as the denominator. Multiphase CT imaging of the brain was performed following IV bolus contrast injection. Subsequent parametric perfusion maps were calculated using RAPID software. CONTRAST:  ISOVUE-370 IOPAMIDOL (ISOVUE-370) INJECTION 76% COMPARISON:  None. FINDINGS: CTA NECK FINDINGS  AORTIC ARCH: There is mild calcific atherosclerosis of the aortic arch. There is no aneurysm, dissection or hemodynamically significant stenosis of the visualized ascending aorta and aortic arch. Conventional 3 vessel aortic branching pattern. The visualized proximal subclavian arteries are widely patent. RIGHT CAROTID SYSTEM: --Common carotid artery: Widely patent origin without common carotid artery dissection or aneurysm. --Internal carotid artery: No dissection, occlusion or aneurysm. Mild atherosclerotic calcification at the carotid bifurcation without hemodynamically significant stenosis. --External carotid artery: No acute abnormality. LEFT CAROTID SYSTEM: --Common carotid artery: Widely patent origin without common carotid artery dissection or aneurysm. --Internal carotid artery:No dissection, occlusion or aneurysm. Mild atherosclerotic calcification at the carotid bifurcation without hemodynamically significant stenosis. --External carotid artery: No acute abnormality. VERTEBRAL ARTERIES: Left dominant configuration. Both origins are normal. There is mild-to-moderate narrowing of both V4 segments. SKELETON: There is no bony spinal canal stenosis. No lytic or blastic lesion. OTHER NECK: Normal pharynx, larynx and major salivary glands. No cervical lymphadenopathy. Unremarkable thyroid gland. UPPER CHEST: No pneumothorax or pleural effusion. No nodules or masses. CTA HEAD FINDINGS ANTERIOR CIRCULATION: --Intracranial internal carotid arteries: Moderate atherosclerotic calcification of both internal carotid arteries at the skull base. --Anterior cerebral arteries: Normal. Both A1 segments are present. Patent anterior communicating artery. --Middle cerebral arteries: Normal. --Posterior communicating arteries: Absent bilaterally. POSTERIOR CIRCULATION: --Basilar artery: Normal. --Posterior cerebral arteries: Normal. --Superior cerebellar arteries: Normal. --Inferior cerebellar arteries: Normal  anterior and  posterior inferior cerebellar arteries. VENOUS SINUSES: As permitted by contrast timing, patent. ANATOMIC VARIANTS: None DELAYED PHASE: No parenchymal contrast enhancement. Review of the MIP images confirms the above findings. CT Brain Perfusion Findings: CBF (<30%) Volume: 0mL Perfusion (Tmax>6.0s) volume: 0mL Mismatch Volume: 0 mL Infarction Location:none IMPRESSION: 1. No acute ischemia by CT perfusion criteria. 2. No emergent large vessel occlusion or high-grade stenosis. 3. Moderate atherosclerotic calcification of the internal carotid and vertebral arteries at the skull base. Electronically Signed   By: Deatra Robinson M.D.   On: 03/19/2018 23:08   Ct Angio Neck W And/or Wo Contrast  Result Date: 03/19/2018 CLINICAL DATA:  Weakness, slurred speech and confusion. EXAM: CT ANGIOGRAPHY HEAD AND NECK CT PERFUSION BRAIN TECHNIQUE: Multidetector CT imaging of the head and neck was performed using the standard protocol during bolus administration of intravenous contrast. Multiplanar CT image reconstructions and MIPs were obtained to evaluate the vascular anatomy. Carotid stenosis measurements (when applicable) are obtained utilizing NASCET criteria, using the distal internal carotid diameter as the denominator. Multiphase CT imaging of the brain was performed following IV bolus contrast injection. Subsequent parametric perfusion maps were calculated using RAPID software. CONTRAST:  ISOVUE-370 IOPAMIDOL (ISOVUE-370) INJECTION 76% COMPARISON:  None. FINDINGS: CTA NECK FINDINGS AORTIC ARCH: There is mild calcific atherosclerosis of the aortic arch. There is no aneurysm, dissection or hemodynamically significant stenosis of the visualized ascending aorta and aortic arch. Conventional 3 vessel aortic branching pattern. The visualized proximal subclavian arteries are widely patent. RIGHT CAROTID SYSTEM: --Common carotid artery: Widely patent origin without common carotid artery dissection or aneurysm. --Internal  carotid artery: No dissection, occlusion or aneurysm. Mild atherosclerotic calcification at the carotid bifurcation without hemodynamically significant stenosis. --External carotid artery: No acute abnormality. LEFT CAROTID SYSTEM: --Common carotid artery: Widely patent origin without common carotid artery dissection or aneurysm. --Internal carotid artery:No dissection, occlusion or aneurysm. Mild atherosclerotic calcification at the carotid bifurcation without hemodynamically significant stenosis. --External carotid artery: No acute abnormality. VERTEBRAL ARTERIES: Left dominant configuration. Both origins are normal. There is mild-to-moderate narrowing of both V4 segments. SKELETON: There is no bony spinal canal stenosis. No lytic or blastic lesion. OTHER NECK: Normal pharynx, larynx and major salivary glands. No cervical lymphadenopathy. Unremarkable thyroid gland. UPPER CHEST: No pneumothorax or pleural effusion. No nodules or masses. CTA HEAD FINDINGS ANTERIOR CIRCULATION: --Intracranial internal carotid arteries: Moderate atherosclerotic calcification of both internal carotid arteries at the skull base. --Anterior cerebral arteries: Normal. Both A1 segments are present. Patent anterior communicating artery. --Middle cerebral arteries: Normal. --Posterior communicating arteries: Absent bilaterally. POSTERIOR CIRCULATION: --Basilar artery: Normal. --Posterior cerebral arteries: Normal. --Superior cerebellar arteries: Normal. --Inferior cerebellar arteries: Normal anterior and posterior inferior cerebellar arteries. VENOUS SINUSES: As permitted by contrast timing, patent. ANATOMIC VARIANTS: None DELAYED PHASE: No parenchymal contrast enhancement. Review of the MIP images confirms the above findings. CT Brain Perfusion Findings: CBF (<30%) Volume: 0mL Perfusion (Tmax>6.0s) volume: 0mL Mismatch Volume: 0 mL Infarction Location:none IMPRESSION: 1. No acute ischemia by CT perfusion criteria. 2. No emergent large  vessel occlusion or high-grade stenosis. 3. Moderate atherosclerotic calcification of the internal carotid and vertebral arteries at the skull base. Electronically Signed   By: Deatra Robinson M.D.   On: 03/19/2018 23:08   Mr Brain Wo Contrast  Result Date: 03/19/2018 CLINICAL DATA:  Headache and left-sided weakness EXAM: MRI HEAD WITHOUT CONTRAST TECHNIQUE: Multiplanar, multiecho pulse sequences of the brain and surrounding structures were obtained without intravenous contrast. COMPARISON:  Head CT from earlier  today FINDINGS: Brain: Acute infarct in the right pons, respecting midline. Although signal is weak, there is definite restricted diffusion. Remote lateral lenticulostriate infarcts bilaterally, affecting putamen and deep white matter tracts. Mild chronic small vessel ischemic gliosis in the cerebral white matter. Small remote left cerebellar infarct. No acute hemorrhage, hydrocephalus, or collection. Vascular: Major flow voids are preserved Skull and upper cervical spine: No evidence of marrow lesion Sinuses/Orbits: Negative IMPRESSION: 1. Acute infarct in the right pons. 2. Remote basal ganglia and left cerebellar infarcts. Electronically Signed   By: Marnee Spring M.D.   On: 03/19/2018 21:41   Ct Cerebral Perfusion W Contrast  Result Date: 03/19/2018 CLINICAL DATA:  Weakness, slurred speech and confusion. EXAM: CT ANGIOGRAPHY HEAD AND NECK CT PERFUSION BRAIN TECHNIQUE: Multidetector CT imaging of the head and neck was performed using the standard protocol during bolus administration of intravenous contrast. Multiplanar CT image reconstructions and MIPs were obtained to evaluate the vascular anatomy. Carotid stenosis measurements (when applicable) are obtained utilizing NASCET criteria, using the distal internal carotid diameter as the denominator. Multiphase CT imaging of the brain was performed following IV bolus contrast injection. Subsequent parametric perfusion maps were calculated using RAPID  software. CONTRAST:  ISOVUE-370 IOPAMIDOL (ISOVUE-370) INJECTION 76% COMPARISON:  None. FINDINGS: CTA NECK FINDINGS AORTIC ARCH: There is mild calcific atherosclerosis of the aortic arch. There is no aneurysm, dissection or hemodynamically significant stenosis of the visualized ascending aorta and aortic arch. Conventional 3 vessel aortic branching pattern. The visualized proximal subclavian arteries are widely patent. RIGHT CAROTID SYSTEM: --Common carotid artery: Widely patent origin without common carotid artery dissection or aneurysm. --Internal carotid artery: No dissection, occlusion or aneurysm. Mild atherosclerotic calcification at the carotid bifurcation without hemodynamically significant stenosis. --External carotid artery: No acute abnormality. LEFT CAROTID SYSTEM: --Common carotid artery: Widely patent origin without common carotid artery dissection or aneurysm. --Internal carotid artery:No dissection, occlusion or aneurysm. Mild atherosclerotic calcification at the carotid bifurcation without hemodynamically significant stenosis. --External carotid artery: No acute abnormality. VERTEBRAL ARTERIES: Left dominant configuration. Both origins are normal. There is mild-to-moderate narrowing of both V4 segments. SKELETON: There is no bony spinal canal stenosis. No lytic or blastic lesion. OTHER NECK: Normal pharynx, larynx and major salivary glands. No cervical lymphadenopathy. Unremarkable thyroid gland. UPPER CHEST: No pneumothorax or pleural effusion. No nodules or masses. CTA HEAD FINDINGS ANTERIOR CIRCULATION: --Intracranial internal carotid arteries: Moderate atherosclerotic calcification of both internal carotid arteries at the skull base. --Anterior cerebral arteries: Normal. Both A1 segments are present. Patent anterior communicating artery. --Middle cerebral arteries: Normal. --Posterior communicating arteries: Absent bilaterally. POSTERIOR CIRCULATION: --Basilar artery: Normal. --Posterior  cerebral arteries: Normal. --Superior cerebellar arteries: Normal. --Inferior cerebellar arteries: Normal anterior and posterior inferior cerebellar arteries. VENOUS SINUSES: As permitted by contrast timing, patent. ANATOMIC VARIANTS: None DELAYED PHASE: No parenchymal contrast enhancement. Review of the MIP images confirms the above findings. CT Brain Perfusion Findings: CBF (<30%) Volume: 0mL Perfusion (Tmax>6.0s) volume: 0mL Mismatch Volume: 0 mL Infarction Location:none IMPRESSION: 1. No acute ischemia by CT perfusion criteria. 2. No emergent large vessel occlusion or high-grade stenosis. 3. Moderate atherosclerotic calcification of the internal carotid and vertebral arteries at the skull base. Electronically Signed   By: Deatra Robinson M.D.   On: 03/19/2018 23:08   Ct Head Code Stroke Wo Contrast  Result Date: 03/19/2018 CLINICAL DATA:  Code stroke. Sudden onset of weakness and slurred speech beginning 1 hour ago EXAM: CT HEAD WITHOUT CONTRAST TECHNIQUE: Contiguous axial images were obtained from the base  of the skull through the vertex without intravenous contrast. COMPARISON:  None. FINDINGS: Brain: No evidence of acute infarction, hemorrhage, hydrocephalus, extra-axial collection or mass lesion/mass effect. Remote appearing bilateral basal ganglia infarcts. Small remote left cerebellar infarct. Vascular: Atherosclerotic calcification.  No hyperdense vessel. Skull: Negative Sinuses/Orbits: Negative Other: These results were called by telephone at the time of interpretation on 03/19/2018 at 7:23 pm to Dr. Darnelle Catalan, who verbally acknowledged these results. ASPECTS [facility] Stroke Program Early CT Score) -when accounting for chronic infarct. - Ganglionic level infarction (caudate, lentiform nuclei, internal capsule, insula, M1-M3 cortex): 7 - Supraganglionic infarction (M4-M6 cortex): 3 Total score (0-10 with 10 being normal): 10 IMPRESSION: 1. No acute finding. 2. Remote bilateral basal ganglia and left  cerebellar infarcts. Electronically Signed   By: Marnee Spring M.D.   On: 03/19/2018 19:25    EKG:   Orders placed or performed during the hospital encounter of 03/19/18  . ED EKG  . ED EKG  . EKG 12-Lead  . EKG 12-Lead    IMPRESSION AND PLAN:  Principal Problem:   Stroke [facility]) -CT head initially showed old infarcts, MRI showed new acute stroke.  Patient has persistent symptoms.  Will admit per stroke admission order set with appropriate corresponding imaging, labs, consults.   Chart review performed and case discussed with ED provider. Labs, imaging and/or ECG reviewed by provider and discussed with patient/family. Management plans discussed with the patient and/or family.  DVT PROPHYLAXIS: SubQ lovenox   GI PROPHYLAXIS:  None  ADMISSION STATUS: Inpatient     CODE STATUS: Full  TOTAL TIME TAKING CARE OF THIS PATIENT: 45 minutes.   Alexina Niccoli FIELDING 03/19/2018, 11:39 PM  [facility]  Office  [phone]  CC: Primary care physician; Patient, No Pcp Per  Note:  This document was prepared using Dragon voice recognition software and may include unintentional dictation errors.

## 2018-03-19 NOTE — ED Notes (Signed)
Green button for neurologist called

## 2018-03-19 NOTE — ED Notes (Addendum)
Pt arrived to room. MD updated on pt's history. Neurologist on screen with RN Kimmie

## 2018-03-19 NOTE — ED Notes (Signed)
Report given to Kala RN. 

## 2018-03-19 NOTE — ED Notes (Signed)
Pt requesting some pain medication for his headache at this time.

## 2018-03-19 NOTE — ED Notes (Addendum)
Neurologist Sebesto MD on screen at this time.

## 2018-03-19 NOTE — Consult Note (Addendum)
   [facility] TeleNeurology Consult Services  Impression:  Patient with HA and transient slurred speech/left-sided weakness (perhaps ataxia?).  Now only with mild right arm/leg sensory loss.  Possible lacunar stroke with rapidly resolving deficits of the left cerebellum or right BG.  Possible recrudescence of prior/chronic infarcts in these regions seen on CT.  Consider cocaine as driving etiology.   Not a tpa candidate due to: mild and nondisabling deficits Not an NIR candidate due to: presentation not suggestive of LVO   Differential Diagnosis:   1. Cardioembolic stroke  2. Small vessel disease/lacune  3. Thromboembolic, artery-to-artery mechanism  4. Hypercoagulable state-related infarct  5. Transient ischemic attack  6. Thrombotic mechanism, large artery disease   Comments:   Door time: 1906 [employer] contacted: 1916 [facility] at bedside: 1919 NIHSS assessment time: 1923  Recommendations:  ASA Permissive HTN Statin MRI brain wo CTA head/neck or MRA head/neck UDS Lipids/A1c TTE inpatient neurology consultation Inpatient stroke evaluation as per Neurology/ Internal Medicine Discussed with ED MD  -----------------------------------------------------------------------------------------  CC: stroke alert  History of Present Illness  Patient is a 57 year old man with a history of smoking, alcohol abuse and cocaine abuse presenting with HA and left-sided weakness.  Last normal at 1800.  Thereafter he developed a headache.  He stopped eating dinner due to the HA and got up to get in the car.  On getting up he noted the left side of his body "gave out" and he was slurring his speech.  Still with a HA, but other symptoms resolved.  Denies using any substances today.    Diagnostic: CT head wo - nothing acute  Exam: NIHSS score: 1 1a LOC: 0  1b Questions: 0  1c Commands: 0  2 Gaze: 0  3 VF: 0  4 Face: 0  5a Motor arm left: 0  5b Motor arm right: 0  6a  Motor leg left: 0 6b Motor leg right: 0  7 Ataxia: 0  8 Sensory: 1 (right side less sensitive) 9: Language: 0  10: Speech: 0  11: Extinction: 0       Medical Decision Making:  - Extensive number of diagnosis or management options are considered above.   - Extensive amount of complex data reviewed.   - High risk of complication and/or morbidity or mortality are associated with differential diagnostic considerations above.  - There may be Uncertain outcome and increased probability of prolonged functional impairment or high probability of severe prolonged functional impairment associated with some of these differential diagnosis.   Medical Data Reviewed:  1.Data reviewed include clinical labs, radiology,  Medical Tests;   2.Tests results discussed w/performing or interpreting physician;   3.Obtaining/reviewing old medical records;  4.Obtaining case history from another source;  5.Independent review of image, tracing or specimen.    Patient was informed the Neurology Consult would happen via TeleHealth consult by way of interactive audio and video telecommunications and consented to receiving care in this manner.

## 2018-03-20 ENCOUNTER — Other Ambulatory Visit: Payer: Self-pay

## 2018-03-20 ENCOUNTER — Inpatient Hospital Stay (HOSPITAL_COMMUNITY)
Admit: 2018-03-20 | Discharge: 2018-03-20 | Disposition: A | Discharge status: Home / Self Care | Payer: Self-pay | Attending: Cardiovascular Disease | Admitting: Cardiovascular Disease

## 2018-03-20 DIAGNOSIS — I441 Atrioventricular block, second degree: Secondary | ICD-10-CM

## 2018-03-20 DIAGNOSIS — I455 Other specified heart block: Secondary | ICD-10-CM

## 2018-03-20 DIAGNOSIS — I639 Cerebral infarction, unspecified: Secondary | ICD-10-CM

## 2018-03-20 DIAGNOSIS — I517 Cardiomegaly: Secondary | ICD-10-CM

## 2018-03-20 DIAGNOSIS — F1010 Alcohol abuse, uncomplicated: Secondary | ICD-10-CM

## 2018-03-20 LAB — URINE DRUG SCREEN, QUALITATIVE (ARMC ONLY)
AMPHETAMINES, UR SCREEN: NOT DETECTED
Barbiturates, Ur Screen: NOT DETECTED
Benzodiazepine, Ur Scrn: NOT DETECTED
COCAINE METABOLITE, UR ~~LOC~~: POSITIVE — AB
Cannabinoid 50 Ng, Ur ~~LOC~~: NOT DETECTED
MDMA (ECSTASY) UR SCREEN: NOT DETECTED
Methadone Scn, Ur: NOT DETECTED
Opiate, Ur Screen: NOT DETECTED
Phencyclidine (PCP) Ur S: NOT DETECTED
TRICYCLIC, UR SCREEN: NOT DETECTED

## 2018-03-20 LAB — LIPID PANEL
CHOL/HDL RATIO: 4.7 ratio
CHOLESTEROL: 156 mg/dL (ref 0–200)
HDL: 33 mg/dL — ABNORMAL LOW (ref >40)
LDL CALC: UNDETERMINED mg/dL (ref 0–99)
TRIGLYCERIDES: 511 mg/dL — AB (ref <150)
VLDL: UNDETERMINED mg/dL (ref 0–40)

## 2018-03-20 LAB — URINALYSIS, ROUTINE W REFLEX MICROSCOPIC
BILIRUBIN URINE: NEGATIVE
Glucose, UA: NEGATIVE mg/dL
Hgb urine dipstick: NEGATIVE
KETONES UR: NEGATIVE mg/dL
Leukocytes, UA: NEGATIVE
NITRITE: NEGATIVE
PH: 8 (ref 5.0–8.0)
Protein, ur: NEGATIVE mg/dL
SPECIFIC GRAVITY, URINE: 1.041 — AB (ref 1.005–1.030)

## 2018-03-20 LAB — HEMOGLOBIN A1C
Hgb A1c MFr Bld: 6.4 % — ABNORMAL HIGH (ref 4.8–5.6)
Mean Plasma Glucose: 136.98 mg/dL

## 2018-03-20 MED ORDER — ORAL CARE MOUTH RINSE
15.0000 mL | Freq: Two times a day (BID) | OROMUCOSAL | Status: DC
  Administered 2018-03-20 – 2018-03-22 (×3): 15 mL via OROMUCOSAL

## 2018-03-20 MED ORDER — STROKE: EARLY STAGES OF RECOVERY BOOK
Freq: Once | Status: AC
  Administered 2018-03-20: 01:00:00

## 2018-03-20 MED ORDER — FENOFIBRATE 160 MG PO TABS
160.0000 mg | ORAL_TABLET | Freq: Every day | ORAL | Status: DC
  Administered 2018-03-20 – 2018-03-22 (×3): 160 mg via ORAL
  Filled 2018-03-20 (×3): qty 1

## 2018-03-20 MED ORDER — OXYCODONE HCL 5 MG PO TABS: 5.0000 mg | ORAL_TABLET | Freq: Four times a day (QID) | ORAL | Status: DC | PRN

## 2018-03-20 MED ORDER — ACETAMINOPHEN 160 MG/5ML PO SOLN
650.0000 mg | ORAL | Status: DC | PRN
  Filled 2018-03-20: qty 20.3

## 2018-03-20 MED ORDER — ACETAMINOPHEN 325 MG PO TABS
650.0000 mg | ORAL_TABLET | ORAL | Status: DC | PRN
  Administered 2018-03-20: 10:00:00 650 mg via ORAL
  Filled 2018-03-20 (×2): qty 2

## 2018-03-20 MED ORDER — NICOTINE 21 MG/24HR TD PT24
21.0000 mg | MEDICATED_PATCH | Freq: Every day | TRANSDERMAL | Status: DC
  Administered 2018-03-20 – 2018-03-22 (×3): 21 mg via TRANSDERMAL
  Filled 2018-03-20 (×3): qty 1

## 2018-03-20 MED ORDER — MAGIC MOUTHWASH
10.0000 mL | Freq: Three times a day (TID) | ORAL | Status: DC
  Administered 2018-03-20 – 2018-03-22 (×6): 10 mL via ORAL
  Filled 2018-03-20 (×6): qty 10

## 2018-03-20 MED ORDER — POTASSIUM CHLORIDE CRYS ER 10 MEQ PO TBCR
10.0000 meq | EXTENDED_RELEASE_TABLET | Freq: Two times a day (BID) | ORAL | Status: AC
  Administered 2018-03-20 (×2): 10 meq via ORAL
  Filled 2018-03-20 (×2): qty 1

## 2018-03-20 MED ORDER — ENOXAPARIN SODIUM 40 MG/0.4ML ~~LOC~~ SOLN
40.0000 mg | SUBCUTANEOUS | Status: DC
  Administered 2018-03-20 – 2018-03-21 (×3): 40 mg via SUBCUTANEOUS
  Filled 2018-03-20 (×3): qty 0.4

## 2018-03-20 MED ORDER — ATORVASTATIN CALCIUM 20 MG PO TABS
40.0000 mg | ORAL_TABLET | Freq: Every day | ORAL | Status: DC
  Administered 2018-03-20 – 2018-03-21 (×2): 40 mg via ORAL
  Filled 2018-03-20 (×2): qty 2

## 2018-03-20 MED ORDER — ACETAMINOPHEN 650 MG RE SUPP: 650.0000 mg | RECTAL | Status: DC | PRN

## 2018-03-20 MED ORDER — ASPIRIN 81 MG PO CHEW
81.0000 mg | CHEWABLE_TABLET | Freq: Every day | ORAL | Status: DC
  Administered 2018-03-20 – 2018-03-22 (×3): 81 mg via ORAL
  Filled 2018-03-20 (×4): qty 1

## 2018-03-20 NOTE — Evaluation (Addendum)
Clinical/Bedside Swallow Evaluation Patient Details  Name: Clinton Knight MRN: [id number] Date of Birth: [birth year]  Today's Date: 03/20/2018 Time: SLP Start Time (ACUTE ONLY): 0910 SLP Stop Time (ACUTE ONLY): 1010 SLP Time Calculation (min) (ACUTE ONLY): 60 min  Past Medical History:  Past Medical History:  Diagnosis Date  . Acute kidney insufficiency    Cr 1.1 in setting of stroke  . Alcohol abuse   . Cocaine abuse (HCC)   . Diabetes (HCC)   . Hypertension   . Hypertriglyceridemia   . Nicotine abuse   . Stroke [facility])    Past Surgical History:  Past Surgical History:  Procedure Laterality Date  . NO PAST SURGERIES     HPI:  Pt is a 57 y.o. male w/ PMH of strokes x3, ETOH and cocaine abuse, DM, HTN, tobacco abuse who presents w/ sudden onset discomfort behind his right eye and slurring of speech, followed promptly by left-sided weakness and sensory deficit.  He came to ED for evaluation and was found here in MRI to have an acute stroke w/ vision problems right eye as well as pain, side weakness, sensory deficits.  Pt stated his speech is much improved but continues to have some discomfort d/t a sore area under his tongue along the frenulum.  Pt's speech was intelligible; no apparent receptive or expressive language deficits noted during conversation or during PT session(following instructions, communication). 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    Assessment / Plan / Recommendation Clinical Impression  Pt appears to present w/ adequate oropharyngeal phase swallow function w/ reduced risk for aspiration when following general aspiration precautions. Pt sat forward in his chair and fed himself trials of thin liquids, purees, and moist solids w/ no overt s/s of aspiration noted; no wet vocal quality or decline in  respiratory status during/post trials. Oral phase was [facility] for bolus management and oral clearing b/t trials. Timing of A-P transfer and rotary mastication appropriate. Pt did c/o min oral discomfort from a sore spot under his tongue along the frenulum but this did not appear to impact his oral abilities. Pt fed himself. OM exam WFL w/ only slight decreased tone noted in L upper lip/corner noted (no leakage or spillage of bolus material occurred during trials). Recommend a regular diet w/ thin liquids; general aspiration precautions; pills Whole in a puree if easier for swallowing d/t any oral discomfort. Pt education on general aspiration precautions; Sister present - all agreed. No futher skilled ST services indicated as pt appears at his baseline w/ swallowing. Instructed pt to f/u w/ PCP if any concerns re: speech post d/c home; pt denied such at this time. NSG updated. Did speak w/ MD/NSG about an oral rinse for pt's mouth/sore spot area. MD agreed. SLP Visit Diagnosis: Dysphagia, unspecified (R13.10)    Aspiration Risk  (reduced )    Diet Recommendation  Regular diet w/ thin liquids; general aspiration precautions  Medication Administration: Whole meds with liquid(as tolerates)    Other  Recommendations Recommended Consults: (n/a) Oral Care Recommendations: Oral care BID;Patient independent with oral care Other Recommendations: (n/a)   Follow up Recommendations None      Frequency and Duration (n/a)  (n/a)       Prognosis Prognosis for Safe Diet Advancement: Good      Swallow Study   General Date of Onset: 03/19/18  HPI: Pt is a 57 y.o. male w/ PMH of strokes x3, ETOH abuse who presents w/ sudden onset discomfort behind his right eye and slurring of speech, followed promptly by left-sided weakness and sensory deficit.  He came to ED for evaluation and was found here in MRI to have an acute stroke w/ vision problems right eye as well as pain, side weakness, sensory deficits.  Pt stated  his speech is much improved but continues to have some discomfort d/t a sore area under his tongue along the frenulum.  Pt's speech was intelligible; no apparent receptive or expressive language deficits noted during conversation or during PT session(following instructions, communication).  Type of Study: Bedside Swallow Evaluation Previous Swallow Assessment: none Diet Prior to this Study: NPO(since admission; regular diet at home) Temperature Spikes Noted: No(wbc 10.3) Respiratory Status: Room air History of Recent Intubation: No Behavior/Cognition: Alert;Cooperative;Pleasant mood Oral Cavity Assessment: Within Functional Limits;Lesions(under tongue along the frenulum) Oral Care Completed by SLP: Yes Oral Cavity - Dentition: Adequate natural dentition(missing couple of molars) Vision: Functional for self-feeding Self-Feeding Abilities: Able to feed self Patient Positioning: Upright in chair Baseline Vocal Quality: Normal;Low vocal intensity(min) Volitional Cough: Strong Volitional Swallow: Able to elicit    Oral/Motor/Sensory Function Overall Oral Motor/Sensory Function: Within functional limits(slight decreased tone in L upper lip corner)   Ice Chips Presentation: Spoon(fed; 2 trials)   Thin Liquid Thin Liquid: Within functional limits Presentation: Cup;Self Fed;Straw(4 ozs total)    Nectar Thick Nectar Thick Liquid: Not tested   Honey Thick Honey Thick Liquid: Not tested   Puree Puree: Within functional limits Presentation: Self Fed;Spoon(5-6 bites)   Solid     Solid: Within functional limits Presentation: Self Fed(4 trials)       Clinton Som, MS, CCC-SLP Knight,Clinton 03/20/2018,2:54 PM

## 2018-03-20 NOTE — Progress Notes (Signed)
[facility] - [place] [facility]                                                                                                                                                                                  Patient Demographics   Clinton Knight, is a 57 y.o. male, DOB - [birth year], ZOX:[id number]  Admit date - 03/19/2018   Admitting Physician Arnaldo Natal, MD  Outpatient Primary MD for the patient is Patient, No Pcp Per   LOS - 1  Subjective: Patient admitted with acute CVA still continues to complain of left upper extremity left lower extremity weakness    Review of Systems:   CONSTITUTIONAL: No documented fever. No fatigue, weakness. No weight gain, no weight loss.  EYES: No blurry or double vision.  ENT: No tinnitus. No postnasal drip. No redness of the oropharynx.  RESPIRATORY: No cough, no wheeze, no hemoptysis. No dyspnea.  CARDIOVASCULAR: No chest pain. No orthopnea. No palpitations. No syncope.  GASTROINTESTINAL: No nausea, no vomiting or diarrhea. No abdominal pain. No melena or hematochezia.  GENITOURINARY: No dysuria or hematuria.  ENDOCRINE: No polyuria or nocturia. No heat or cold intolerance.  HEMATOLOGY: No anemia. No bruising. No bleeding.  INTEGUMENTARY: No rashes. No lesions.  MUSCULOSKELETAL: No arthritis. No swelling. No gout.  NEUROLOGIC: Left upper extremity left lower extremity weakness PSYCHIATRIC: No anxiety. No insomnia. No ADD.    Vitals:   Vitals:   03/20/18 0413 03/20/18 0611 03/20/18 0757 03/20/18 0952  BP: (!) 149/74 (!) 151/92 (!) 156/77 139/77  Pulse: (!) 57 (!) 49 (!) 49 (!) 44  Resp: 20 20 16 18   Temp: 98 F (36.7 C) 98 F (36.7 C) 98 F (36.7 C) 97.6 F (36.4 C)  TempSrc: Oral Oral Oral Oral  SpO2: 98% 99% 99% 100%  Weight:      Height:        Wt Readings from Last 3 Encounters:  03/19/18 90.6 kg  11/03/17 81.6 kg  04/22/17 83.9 kg     Intake/Output Summary (Last 24 hours) at 03/20/2018 1156 Last  data filed at 03/20/2018 0908 Gross per 24 hour  Intake -  Output 350 ml  Net -350 ml    Physical Exam:   GENERAL: Pleasant-appearing in no apparent distress.  HEAD, EYES, EARS, NOSE AND THROAT: Atraumatic, normocephalic. Extraocular muscles are intact. Pupils equal and reactive to light. Sclerae anicteric. No conjunctival injection. No oro-pharyngeal erythema.  NECK: Supple. There is no jugular venous distention. No bruits, no lymphadenopathy, no thyromegaly.  HEART: Regular rate and rhythm,. No murmurs, no rubs, no clicks.  LUNGS: Clear to auscultation bilaterally. No rales or rhonchi. No wheezes.  ABDOMEN:  Soft, flat, nontender, nondistended. Has good bowel sounds. No hepatosplenomegaly appreciated.  EXTREMITIES: No evidence of any cyanosis, clubbing, or peripheral edema.  +2 pedal and radial pulses bilaterally.  NEUROLOGIC: The patient is alert, awake, and oriented x3 with left upper extremity and left lower extremity strength is 3 out of 5 SKIN: Moist and warm with no rashes appreciated.  Psych: Not anxious, depressed LN: No inguinal LN enlargement    Antibiotics   Anti-infectives (From admission, onward)   None      Medications   Scheduled Meds: . aspirin  81 mg Oral Daily  . enoxaparin (LOVENOX) injection  40 mg Subcutaneous Q24H  . mouth rinse  15 mL Mouth Rinse BID   Continuous Infusions: PRN Meds:.acetaminophen **OR** acetaminophen (TYLENOL) oral liquid 160 mg/5 mL **OR** acetaminophen, oxyCODONE   Data Review:   Micro Results No results found for this or any previous visit (from the past 240 hour(s)).  Radiology Reports Ct Angio Head W Or Wo Contrast  Result Date: 03/19/2018 CLINICAL DATA:  Weakness, slurred speech and confusion. EXAM: CT ANGIOGRAPHY HEAD AND NECK CT PERFUSION BRAIN TECHNIQUE: Multidetector CT imaging of the head and neck was performed using the standard protocol during bolus administration of intravenous contrast. Multiplanar CT image  reconstructions and MIPs were obtained to evaluate the vascular anatomy. Carotid stenosis measurements (when applicable) are obtained utilizing NASCET criteria, using the distal internal carotid diameter as the denominator. Multiphase CT imaging of the brain was performed following IV bolus contrast injection. Subsequent parametric perfusion maps were calculated using RAPID software. CONTRAST:  ISOVUE-370 IOPAMIDOL (ISOVUE-370) INJECTION 76% COMPARISON:  None. FINDINGS: CTA NECK FINDINGS AORTIC ARCH: There is mild calcific atherosclerosis of the aortic arch. There is no aneurysm, dissection or hemodynamically significant stenosis of the visualized ascending aorta and aortic arch. Conventional 3 vessel aortic branching pattern. The visualized proximal subclavian arteries are widely patent. RIGHT CAROTID SYSTEM: --Common carotid artery: Widely patent origin without common carotid artery dissection or aneurysm. --Internal carotid artery: No dissection, occlusion or aneurysm. Mild atherosclerotic calcification at the carotid bifurcation without hemodynamically significant stenosis. --External carotid artery: No acute abnormality. LEFT CAROTID SYSTEM: --Common carotid artery: Widely patent origin without common carotid artery dissection or aneurysm. --Internal carotid artery:No dissection, occlusion or aneurysm. Mild atherosclerotic calcification at the carotid bifurcation without hemodynamically significant stenosis. --External carotid artery: No acute abnormality. VERTEBRAL ARTERIES: Left dominant configuration. Both origins are normal. There is mild-to-moderate narrowing of both V4 segments. SKELETON: There is no bony spinal canal stenosis. No lytic or blastic lesion. OTHER NECK: Normal pharynx, larynx and major salivary glands. No cervical lymphadenopathy. Unremarkable thyroid gland. UPPER CHEST: No pneumothorax or pleural effusion. No nodules or masses. CTA HEAD FINDINGS ANTERIOR CIRCULATION: --Intracranial  internal carotid arteries: Moderate atherosclerotic calcification of both internal carotid arteries at the skull base. --Anterior cerebral arteries: Normal. Both A1 segments are present. Patent anterior communicating artery. --Middle cerebral arteries: Normal. --Posterior communicating arteries: Absent bilaterally. POSTERIOR CIRCULATION: --Basilar artery: Normal. --Posterior cerebral arteries: Normal. --Superior cerebellar arteries: Normal. --Inferior cerebellar arteries: Normal anterior and posterior inferior cerebellar arteries. VENOUS SINUSES: As permitted by contrast timing, patent. ANATOMIC VARIANTS: None DELAYED PHASE: No parenchymal contrast enhancement. Review of the MIP images confirms the above findings. CT Brain Perfusion Findings: CBF (<30%) Volume: 0mL Perfusion (Tmax>6.0s) volume: 0mL Mismatch Volume: 0 mL Infarction Location:none IMPRESSION: 1. No acute ischemia by CT perfusion criteria. 2. No emergent large vessel occlusion or high-grade stenosis. 3. Moderate atherosclerotic calcification of the internal carotid and vertebral  arteries at the skull base. Electronically Signed   By: Deatra Robinson M.D.   On: 03/19/2018 23:08   Ct Angio Neck W And/or Wo Contrast  Result Date: 03/19/2018 CLINICAL DATA:  Weakness, slurred speech and confusion. EXAM: CT ANGIOGRAPHY HEAD AND NECK CT PERFUSION BRAIN TECHNIQUE: Multidetector CT imaging of the head and neck was performed using the standard protocol during bolus administration of intravenous contrast. Multiplanar CT image reconstructions and MIPs were obtained to evaluate the vascular anatomy. Carotid stenosis measurements (when applicable) are obtained utilizing NASCET criteria, using the distal internal carotid diameter as the denominator. Multiphase CT imaging of the brain was performed following IV bolus contrast injection. Subsequent parametric perfusion maps were calculated using RAPID software. CONTRAST:  ISOVUE-370 IOPAMIDOL (ISOVUE-370)  INJECTION 76% COMPARISON:  None. FINDINGS: CTA NECK FINDINGS AORTIC ARCH: There is mild calcific atherosclerosis of the aortic arch. There is no aneurysm, dissection or hemodynamically significant stenosis of the visualized ascending aorta and aortic arch. Conventional 3 vessel aortic branching pattern. The visualized proximal subclavian arteries are widely patent. RIGHT CAROTID SYSTEM: --Common carotid artery: Widely patent origin without common carotid artery dissection or aneurysm. --Internal carotid artery: No dissection, occlusion or aneurysm. Mild atherosclerotic calcification at the carotid bifurcation without hemodynamically significant stenosis. --External carotid artery: No acute abnormality. LEFT CAROTID SYSTEM: --Common carotid artery: Widely patent origin without common carotid artery dissection or aneurysm. --Internal carotid artery:No dissection, occlusion or aneurysm. Mild atherosclerotic calcification at the carotid bifurcation without hemodynamically significant stenosis. --External carotid artery: No acute abnormality. VERTEBRAL ARTERIES: Left dominant configuration. Both origins are normal. There is mild-to-moderate narrowing of both V4 segments. SKELETON: There is no bony spinal canal stenosis. No lytic or blastic lesion. OTHER NECK: Normal pharynx, larynx and major salivary glands. No cervical lymphadenopathy. Unremarkable thyroid gland. UPPER CHEST: No pneumothorax or pleural effusion. No nodules or masses. CTA HEAD FINDINGS ANTERIOR CIRCULATION: --Intracranial internal carotid arteries: Moderate atherosclerotic calcification of both internal carotid arteries at the skull base. --Anterior cerebral arteries: Normal. Both A1 segments are present. Patent anterior communicating artery. --Middle cerebral arteries: Normal. --Posterior communicating arteries: Absent bilaterally. POSTERIOR CIRCULATION: --Basilar artery: Normal. --Posterior cerebral arteries: Normal. --Superior cerebellar arteries:  Normal. --Inferior cerebellar arteries: Normal anterior and posterior inferior cerebellar arteries. VENOUS SINUSES: As permitted by contrast timing, patent. ANATOMIC VARIANTS: None DELAYED PHASE: No parenchymal contrast enhancement. Review of the MIP images confirms the above findings. CT Brain Perfusion Findings: CBF (<30%) Volume: 0mL Perfusion (Tmax>6.0s) volume: 0mL Mismatch Volume: 0 mL Infarction Location:none IMPRESSION: 1. No acute ischemia by CT perfusion criteria. 2. No emergent large vessel occlusion or high-grade stenosis. 3. Moderate atherosclerotic calcification of the internal carotid and vertebral arteries at the skull base. Electronically Signed   By: Deatra Robinson M.D.   On: 03/19/2018 23:08   Mr Brain Wo Contrast  Result Date: 03/19/2018 CLINICAL DATA:  Headache and left-sided weakness EXAM: MRI HEAD WITHOUT CONTRAST TECHNIQUE: Multiplanar, multiecho pulse sequences of the brain and surrounding structures were obtained without intravenous contrast. COMPARISON:  Head CT from earlier today FINDINGS: Brain: Acute infarct in the right pons, respecting midline. Although signal is weak, there is definite restricted diffusion. Remote lateral lenticulostriate infarcts bilaterally, affecting putamen and deep white matter tracts. Mild chronic small vessel ischemic gliosis in the cerebral white matter. Small remote left cerebellar infarct. No acute hemorrhage, hydrocephalus, or collection. Vascular: Major flow voids are preserved Skull and upper cervical spine: No evidence of marrow lesion Sinuses/Orbits: Negative IMPRESSION: 1. Acute infarct in the right  pons. 2. Remote basal ganglia and left cerebellar infarcts. Electronically Signed   By: Marnee Spring M.D.   On: 03/19/2018 21:41   Ct Cerebral Perfusion W Contrast  Result Date: 03/19/2018 CLINICAL DATA:  Weakness, slurred speech and confusion. EXAM: CT ANGIOGRAPHY HEAD AND NECK CT PERFUSION BRAIN TECHNIQUE: Multidetector CT imaging of the head  and neck was performed using the standard protocol during bolus administration of intravenous contrast. Multiplanar CT image reconstructions and MIPs were obtained to evaluate the vascular anatomy. Carotid stenosis measurements (when applicable) are obtained utilizing NASCET criteria, using the distal internal carotid diameter as the denominator. Multiphase CT imaging of the brain was performed following IV bolus contrast injection. Subsequent parametric perfusion maps were calculated using RAPID software. CONTRAST:  ISOVUE-370 IOPAMIDOL (ISOVUE-370) INJECTION 76% COMPARISON:  None. FINDINGS: CTA NECK FINDINGS AORTIC ARCH: There is mild calcific atherosclerosis of the aortic arch. There is no aneurysm, dissection or hemodynamically significant stenosis of the visualized ascending aorta and aortic arch. Conventional 3 vessel aortic branching pattern. The visualized proximal subclavian arteries are widely patent. RIGHT CAROTID SYSTEM: --Common carotid artery: Widely patent origin without common carotid artery dissection or aneurysm. --Internal carotid artery: No dissection, occlusion or aneurysm. Mild atherosclerotic calcification at the carotid bifurcation without hemodynamically significant stenosis. --External carotid artery: No acute abnormality. LEFT CAROTID SYSTEM: --Common carotid artery: Widely patent origin without common carotid artery dissection or aneurysm. --Internal carotid artery:No dissection, occlusion or aneurysm. Mild atherosclerotic calcification at the carotid bifurcation without hemodynamically significant stenosis. --External carotid artery: No acute abnormality. VERTEBRAL ARTERIES: Left dominant configuration. Both origins are normal. There is mild-to-moderate narrowing of both V4 segments. SKELETON: There is no bony spinal canal stenosis. No lytic or blastic lesion. OTHER NECK: Normal pharynx, larynx and major salivary glands. No cervical lymphadenopathy. Unremarkable thyroid gland.  UPPER CHEST: No pneumothorax or pleural effusion. No nodules or masses. CTA HEAD FINDINGS ANTERIOR CIRCULATION: --Intracranial internal carotid arteries: Moderate atherosclerotic calcification of both internal carotid arteries at the skull base. --Anterior cerebral arteries: Normal. Both A1 segments are present. Patent anterior communicating artery. --Middle cerebral arteries: Normal. --Posterior communicating arteries: Absent bilaterally. POSTERIOR CIRCULATION: --Basilar artery: Normal. --Posterior cerebral arteries: Normal. --Superior cerebellar arteries: Normal. --Inferior cerebellar arteries: Normal anterior and posterior inferior cerebellar arteries. VENOUS SINUSES: As permitted by contrast timing, patent. ANATOMIC VARIANTS: None DELAYED PHASE: No parenchymal contrast enhancement. Review of the MIP images confirms the above findings. CT Brain Perfusion Findings: CBF (<30%) Volume: 0mL Perfusion (Tmax>6.0s) volume: 0mL Mismatch Volume: 0 mL Infarction Location:none IMPRESSION: 1. No acute ischemia by CT perfusion criteria. 2. No emergent large vessel occlusion or high-grade stenosis. 3. Moderate atherosclerotic calcification of the internal carotid and vertebral arteries at the skull base. Electronically Signed   By: Deatra Robinson M.D.   On: 03/19/2018 23:08   Ct Head Code Stroke Wo Contrast  Result Date: 03/19/2018 CLINICAL DATA:  Code stroke. Sudden onset of weakness and slurred speech beginning 1 hour ago EXAM: CT HEAD WITHOUT CONTRAST TECHNIQUE: Contiguous axial images were obtained from the base of the skull through the vertex without intravenous contrast. COMPARISON:  None. FINDINGS: Brain: No evidence of acute infarction, hemorrhage, hydrocephalus, extra-axial collection or mass lesion/mass effect. Remote appearing bilateral basal ganglia infarcts. Small remote left cerebellar infarct. Vascular: Atherosclerotic calcification.  No hyperdense vessel. Skull: Negative Sinuses/Orbits: Negative Other: These  results were called by telephone at the time of interpretation on 03/19/2018 at 7:23 pm to Dr. Darnelle Catalan, who verbally acknowledged these results. ASPECTS [facility] Stroke Program  Early CT Score) -when accounting for chronic infarct. - Ganglionic level infarction (caudate, lentiform nuclei, internal capsule, insula, M1-M3 cortex): 7 - Supraganglionic infarction (M4-M6 cortex): 3 Total score (0-10 with 10 being normal): 10 IMPRESSION: 1. No acute finding. 2. Remote bilateral basal ganglia and left cerebellar infarcts. Electronically Signed   By: Marnee Spring M.D.   On: 03/19/2018 19:25     CBC Recent Labs  Lab 03/19/18 1921  WBC 10.3  HGB 15.0  HCT 46.8  PLT 229  MCV 88.1  MCH 28.2  MCHC 32.1  RDW 14.1  LYMPHSABS 4.4*  MONOABS 0.8  EOSABS 0.3  BASOSABS 0.1    Chemistries  Recent Labs  Lab 03/19/18 1921  NA 142  K 3.8  CL 104  CO2 27  GLUCOSE 104*  BUN 13  CREATININE 1.11  CALCIUM 9.4  AST 31  ALT 30  ALKPHOS 99  BILITOT 0.5   ------------------------------------------------------------------------------------------------------------------ estimated creatinine clearance is 78.3 mL/min (by C-G formula based on SCr of 1.11 mg/dL). ------------------------------------------------------------------------------------------------------------------ Recent Labs    03/20/18 0517  HGBA1C 6.4*   ------------------------------------------------------------------------------------------------------------------ Recent Labs    03/20/18 0517  CHOL 156  HDL 33*  LDLCALC UNABLE TO CALCULATE IF TRIGLYCERIDE OVER 400 mg/dL  TRIG 284*  CHOLHDL 4.7   ------------------------------------------------------------------------------------------------------------------ No results for input(s): TSH, T4TOTAL, T3FREE, THYROIDAB in the last 72 hours.  Invalid input(s):  FREET3 ------------------------------------------------------------------------------------------------------------------ No results for input(s): VITAMINB12, FOLATE, FERRITIN, TIBC, IRON, RETICCTPCT in the last 72 hours.  Coagulation profile Recent Labs  Lab 03/19/18 1921  INR 0.87    No results for input(s): DDIMER in the last 72 hours.  Cardiac Enzymes Recent Labs  Lab 03/19/18 1921  TROPONINI <0.03   ------------------------------------------------------------------------------------------------------------------ Invalid input(s): POCBNP    Assessment & Plan   Patient is a 57 year old with an acute CVA  1. Acute cva This patient on aspirin PT evaluation pending Is passed a swallow eval Echo of the heart pending Start Lipitor  2.  Hypertriglyceridemia start patient on fenofibriate  3. Nicotine abuse smoking cessation provided 4 min spent nicotine patch will be started, pt recommend to stop smoking  4. htn-we will need blood pressure lowering medication likely lisinopril  5.  Sinus pauses with bradycardia continue telemetry cardiology consult has been placed    Code Status Orders  (From admission, onward)         Start     Ordered   03/20/18 0005  Full code  Continuous     03/20/18 0004        Code Status History    This patient has a current code status but no historical code status.           Consults  Cards, and neurology   DVT Prophylaxis  Lovenox  Lab Results  Component Value Date   PLT 229 03/19/2018     Time Spent in minutes   35-minute greater than 50% of time spent in care coordination and counseling patient regarding the condition and plan of care.   Auburn Bilberry M.D on 03/20/2018 at 11:56 AM  Between 7am to 6pm - Pager - [phone]  After 6pm go to [email or web address] - Social research officer, government  [employer]   Office  [phone]

## 2018-03-20 NOTE — Progress Notes (Signed)
Dr Marguerite Olea patel made aware that CCMD reports 3.24sec arrest, acknowledged, no new orders

## 2018-03-20 NOTE — Progress Notes (Addendum)
Physical Therapy Evaluation Patient Details Name: Clinton Knight MRN: [id number] DOB: [birth year] Today's Date: 03/20/2018   History of Present Illness  Pt presents to ED with discomfort behind R eye and slurring speech, soon after had sx of L sided weakness and sensory deficit, admitted for CVA on 10/82019. 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 Pt's PMH includes CVA. Per pt no hx of falls.  Clinical Impression  Pt is a pleasant 57 year old male who was admitted for CVA. Pt performs bed mobility with Min A, transfers and ambulation with Mod A +2 physical and RW. Pt demonstrates deficits with LUE and LLE strength, LUE sensation (C5-T1 dermatomes), LUE coordination (fine motor), mobility, balance, and activity tolerance. Pt is not at his baseline. Would benefit from skilled PT to address above deficits and promote optimal return to PLOF.     Follow Up Recommendations CIR    Equipment Recommendations  Rolling walker with 5" wheels    Recommendations for Other Services       Precautions / Restrictions Precautions Precautions: Fall Restrictions Weight Bearing Restrictions: No      Mobility  Bed Mobility Overal bed mobility: Needs Assistance Bed Mobility: Supine to Sit;Sit to Supine     Supine to sit: Min assist Sit to supine: Min assist;+2 for physical assistance   General bed mobility comments: Pt requires asssit to manage LLE to EOB, and initially an assist to control trunk. Dizziness reported at EOB but resolved quickly. Returning to supine pt required increased assist for LLE, but is independent with trunk.  Transfers Overall transfer level: Needs assistance   Transfers: Sit to/from Stand Sit to Stand: Mod assist;+2 physical assistance;From elevated surface         General transfer comment: Pt has poor anterior  translation and R lateral trunk lean present, poor eccentric control to sit. Pt required heavy VC for squencing t/o.  Ambulation/Gait Ambulation/Gait assistance: Mod assist;+2 physical assistance Gait Distance (Feet): 3 Feet Assistive device: Rolling walker (2 wheeled) Gait Pattern/deviations: Step-to pattern Gait velocity: decreased   General Gait Details: Pt ambulates with very decreased foot clearance and step length. L lateral trunk lean present.   Stairs            Wheelchair Mobility    Modified Rankin (Stroke Patients Only)       Balance Overall balance assessment: Needs assistance Sitting-balance support: Single extremity supported;Feet supported(RUE) Sitting balance-Leahy Scale: Fair Sitting balance - Comments: Pt has reduced control and increased swaying present (L > R). Initially required Min A to maintain but could independently quiet sit for >30 sec. Postural control: Left lateral lean Standing balance support: Bilateral upper extremity supported Standing balance-Leahy Scale: Poor Standing balance comment: Greater WBing in RLE and RUE, greatly flexed trunk, extends with VCs. Pt can maintain position with Min A for > 30 sec, RW for UE support.                             Pertinent Vitals/Pain Pain Assessment: 0-10 Pain Score: 3  Pain Location: Behind R eye Pain Descriptors / Indicators: Aching Pain Intervention(s): Limited activity within patient's tolerance;Monitored during session    Home Living Family/patient expects to be discharged to:: Unsure                 Additional Comments:  At this date pt states they are between places and do not know where they will go at dc.    Prior Function Level of Independence: Independent         Comments: Per pt he was independent with all mobility, ADL, and IADLs.     Hand Dominance   Dominant Hand: Right    Extremity/Trunk Assessment   Upper Extremity Assessment Upper Extremity  Assessment: LUE deficits/detail;RUE deficits/detail RUE Deficits / Details: WFL of tasks assessed RUE Sensation: WNL RUE Coordination: WNL LUE Deficits / Details: 2+/5 LUE Sensation: decreased light touch LUE Coordination: decreased fine motor(Past pointing during finger-to-nose testing)    Lower Extremity Assessment Lower Extremity Assessment: RLE deficits/detail;LLE deficits/detail RLE Deficits / Details: WFL of tasks assessed. RLE Sensation: WNL RLE Coordination: WNL LLE Deficits / Details: 3/5 LLE Sensation: WNL LLE Coordination: decreased gross motor       Communication   Communication: No difficulties;Other (comment)(Pt's speech is still slightly slurred.)  Cognition Arousal/Alertness: Awake/alert Behavior During Therapy: WFL for tasks assessed/performed Overall Cognitive Status: Within Functional Limits for tasks assessed                                 General Comments: Pt able to follow multi-step commands.      General Comments      Exercises Other Exercises Other Exercises: Supine AROM ankle pumps, SLRs, Hip ABD/ADD. Seated at EOB marching and LAQs. All ther-ex performed 10 reps with VC for technique. Other Exercises: Standing marching in place 5 reps each side.   Assessment/Plan    PT Assessment Patient needs continued PT services  PT Problem List Decreased strength;Decreased activity tolerance;Decreased balance;Decreased mobility;Decreased coordination;Decreased knowledge of use of DME;Impaired sensation;Pain       PT Treatment Interventions DME instruction;Gait training;Stair training;Functional mobility training;Therapeutic activities;Therapeutic exercise;Balance training;Neuromuscular re-education;Patient/family education    PT Goals (Current goals can be found in the Care Plan section)  Acute Rehab PT Goals Patient Stated Goal: To be independent again. PT Goal Formulation: With patient Time For Goal Achievement: 04/03/18 Potential to  Achieve Goals: Fair Additional Goals Additional Goal #1: Pt will demonstrate indepence with safe bed mobility and transfers with Mod I for improved functional mobility.    Frequency 7X/week   Barriers to discharge   Pt unsure of dc location.    Co-evaluation               AM-PAC PT "6 Clicks" Daily Activity  Outcome Measure Difficulty turning over in bed (including adjusting bedclothes, sheets and blankets)?: Unable Difficulty moving from lying on back to sitting on the side of the bed? : Unable Difficulty sitting down on and standing up from a chair with arms (e.g., wheelchair, bedside commode, etc,.)?: Unable Help needed moving to and from a bed to chair (including a wheelchair)?: A Lot Help needed walking in hospital room?: A Lot Help needed climbing 3-5 steps with a railing? : A Lot 6 Click Score: 9    End of Session Equipment Utilized During Treatment: Gait belt Activity Tolerance: Patient tolerated treatment well Patient left: in chair;with call bell/phone within reach;with chair alarm set Nurse Communication: Mobility status PT Visit Diagnosis: Unsteadiness on feet (R26.81);Muscle weakness (generalized) (M62.81);Hemiplegia and hemiparesis;Pain Hemiplegia - Right/Left: Left Hemiplegia - dominant/non-dominant: Non-dominant Hemiplegia - caused by: Cerebral infarction Pain - Right/Left: Right Pain - part of body: (Eye)    Time: 4098-1191 PT Time Calculation (min) (ACUTE ONLY): 28 min  Charges:   PT Evaluation $PT Eval Moderate Complexity: 1 Mod PT Treatments $Therapeutic Exercise: 8-22 mins        Arvilla Meres, SPT  Arvilla Meres 03/20/2018, 2:42 PM

## 2018-03-20 NOTE — Progress Notes (Signed)
OT Cancellation Note  Patient Details Name: KEIFER HABIB MRN: [id number] DOB: 12/05/1960   Cancelled Treatment:    Reason Eval/Treat Not Completed: Other (comment). Order received, chart reviewed. Upon attempt, pt on the phone with a financial person, per daughter in room. Requesting OT come back later. Will re-attempt OT evaluation at later time as pt is available.  Richrd Prime, MPH, MS, OTR/L ascom [phone] 03/20/18, 12:07 PM

## 2018-03-20 NOTE — Care Management Note (Signed)
Case Management Note  Patient Details  Name: Clinton Knight MRN: [id number] Date of Birth: 1961/05/21  Subjective/Objective:                 Patient presented to the ED with left sided weakness and speech disturbance.  Prior to this episode of illness, patient independent.  He was to have started a new job.  It is difficult to understand his speech. He says " I got no one to help" then it sounds as though he said something about his mother.  He has no insurance  or pcp. Previous CVA. Therapy evals in progress.  It sounds as though has very limited support.    Action/Plan:  Contacted Melissa Bowe with [facility] to follow for criteria.    Expected Discharge Date:                  Expected Discharge Plan:     In-House Referral:     Discharge planning Services     Post Acute Care Choice:    Choice offered to:     DME Arranged:    DME Agency:     HH Arranged:    HH Agency:     Status of Service:     If discussed at [employer], dates discussed:    Additional Comments:  Eber Hong, RN 03/20/2018, 4:48 PM

## 2018-03-20 NOTE — Progress Notes (Signed)
Attending Note Patient seen and examined, agree with detailed note above,  Patient presentation and plan discussed on rounds.  Cardiology consult placed by Dr.  Allena Katz for sinus pause 3.2 seconds  EKG lab work, chest x-ray reviewed independently by myself Echocardiogram pending  57 year old gentleman with history of alcohol abuse, presenting with code stroke, slurring speech, vision problems right eye as well as pain, side weakness, sensory deficits.  Telemetry monitor noting pauses  He was not given TPA due to mild and nondisabling deficits Review of chart notes previous emergency room visits for alcohol abuse May 2019  CT scan head with stroke x3, old It does not appear that he has close follow-up with local physicians  Telemetry reviewed showing sinus rhythm with second-degree AV block type I, also with rare secondary AV block type II, likely when he is sleeping  On physical examination he is resting comfortably sleeping in a chair, no complaints Unable to estimate JVD, coarse breath sounds bilaterally, heart sounds regular normal S1-S2 no murmurs appreciated abdomen obese soft nontender no significant lower extremity edema  Lab work reviewed showing sodium 142 potassium 3.8 creatinine 1.1 hemoglobin 15 Total cholesterol 156, trig strides greater than 500  EKG personally reviewed by myself showing sinus bradycardia rate 53 bpm nonspecific ST abnormality, repol abnormality, possible LVH  A/P: Sinus pause Several sinus pauses, appears to have second-degree AV block type I Unable to exclude rare second-degree AV block type II Appears to be presenting as he is sleeping, he is asymptomatic In the setting of stroke With monitor for now, no significant work-up needed It appears as echocardiogram pending for stroke work-up  2) stroke Stroke x3 in the past CT scan showing moderate atherosclerosis of the carotids and vertebral arteries -Recommend aspirin, statin  3.  History of  alcohol abuse Recommend alcohol cessation   Greater than 50% was spent in counseling and coordination of care with patient Total encounter time 110 minutes or more   Signed: Dossie Arbour  M.D., Ph.D. [facility]

## 2018-03-20 NOTE — Progress Notes (Signed)
*  PRELIMINARY RESULTS* Echocardiogram 2D Echocardiogram has been performed.  Cristela Blue 03/20/2018, 3:31 PM

## 2018-03-20 NOTE — Progress Notes (Signed)
SLP Cancellation Note  Patient Details Name: Clinton Knight MRN: [id number] DOB: [birth year]   Cancelled treatment:       Reason Eval/Treat Not Completed: SLP screened, no needs identified, will sign off(chart reviewed; consulted NSG, PT then met w/ pt/Sister ). BSE completed. During that session, pt conversed at conversational level w/out gross deficits noted; pt tended to talk softly but could increase his volume when asked. He denied any speech-language deficits; Sister stated his speech was much improved since prior to admission (slurring of speech initially prior to admission). Pt endorsed a sore spot under his tongue along the frenulum that bothered him during talking and po trials. MRI revealed a R pontine infarct.  No further skilled ST services indicated as pt appears close to/at his baseline. Instructed pt to f/u w/ his PCP if any concerns re: his language are noted once his is in his home setting. Pt/Sister agreed. NSG to reconsult if any change in status while admitted.    Orinda Kenner, MS, CCC-SLP Melany Wiesman 03/20/2018, 2:59 PM

## 2018-03-20 NOTE — Consult Note (Signed)
Cardiology Consultation:   Patient ID: Clinton Knight MRN: [id number]; DOB: 03-Jun-1961  Admit date: 03/19/2018 Date of Consult: 03/20/2018  Primary Care Provider: Patient, No Pcp Per Primary Cardiologist:[facility] - [facility]. [facility] Primary Electrophysiologist:  None    Patient Profile:   Clinton Knight is a 57 y.o. male with a hx of HTN, DM2, alcohol abuse, h/o cocaine use with most recent use 10/6, current tobacco abuse, and CVA who is being seen today for the evaluation of sinus pause in setting of code stroke at the request of Dr. Allena Katz.  History of Present Illness:   Clinton Knight is a 57 yo AA male with PMH as above. He is joined today by his daughter, Clinton Knight, and sister, Clinton Knight, (conferenced in over the phone via video conference), both of whom supplemented patient's personal and family history.   Patient reportedly has seen a cardiologist in the past for chest pain but reports that "they didn't find anything." He reported he does not take cardiac medications. (The only medication he reported taking is for DM2). Of note, he saw the cardiologist for intermittent chest pain which he has continued to experience since that time and for the last several years. He reports this chest pain usually lasts about 20 minutes and is made worse when he smokes, so he has always suspected smoking etiology for the pain. Chest pain reportedly alleviates with drinking water.   Patient reports current and past h/o cocaine abuse with most recent use 10/6, as well as current tobacco abuse and h/o alcohol abuse. He reportedly does not get physical exercise. Family history significant for a younger brother, deceased at 85 yo from MI.   On 03/19/18, patient was reportedly eating when he suddenly felt dizzy, weak, and had a severe and sudden onset HA associated with slurred speech and L sided weakness. Patient reportedly also has issues with R eye pain and closes eye frequently.   He presented to [facility] ED and code  stroke activated. CT showed 3 strokes and acute CVA.  In the ED Vitals: BP 127/75, HR 73, RR 16, T 98.3, 100% ORA Labs: Na 142, K 3.8, Cr 1.11 (baseline (0.90), AST 31, ALT 30, Troponin negative x1, WBC 10.3, Hgb 15.0, plts 229. Triglycerides elevated greater than 500. 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 Cardiology consulted for 3.48 second pause on telemetry at 6:59AM and while patient still sleeping. HR 38-68 bpm while sleeping.Rare AV block type II on telemetry.    Passed swallow evaluation.  Pending PT. Pending echo. Started Lipitor, lisinopril. Started fenofibrate.  Past Medical History:  Diagnosis Date  . Stroke (HCC)   DM2, HTN also reported  Past Surgical History:  Procedure Laterality Date  . NO PAST SURGERIES       Home Medications:  Prior to Admission medications   Not on File    Inpatient Medications: Scheduled Meds: . aspirin  81 mg Oral Daily  . enoxaparin (LOVENOX) injection  40 mg Subcutaneous Q24H  . mouth rinse  15 mL Mouth Rinse BID   Continuous Infusions:  PRN Meds: acetaminophen **OR** acetaminophen (TYLENOL) oral liquid 160 mg/5 mL **OR** acetaminophen, oxyCODONE  Allergies:   No Known Allergies  Social History:   Social History   Socioeconomic History  . Marital status: Single  Spouse name: Not on file  . Number of children: Not on file  . Years of education: Not on file  . Highest education level: Not on file  Occupational History  . Not on file  Social Needs  . Financial resource strain: Not on file  . Food insecurity:    Worry: Not on file    Inability: Not on file  . Transportation needs:    Medical: Not on file    Non-medical: Not on file  Tobacco Use  . Smoking status: Current Every Day  Smoker    Packs/day: 1.00    Types: Cigarettes  . Smokeless tobacco: Never Used  Substance and Sexual Activity  . Alcohol use: Yes  . Drug use: No  . Sexual activity: Not on file  Lifestyle  . Physical activity:    Days per week: Not on file    Minutes per session: Not on file  . Stress: Not on file  Relationships  . Social connections:    Talks on phone: Not on file    Gets together: Not on file    Attends religious service: Not on file    Active member of club or organization: Not on file    Attends meetings of clubs or organizations: Not on file    Relationship status: Not on file  . Intimate partner violence:    Fear of current or ex partner: Not on file    Emotionally abused: Not on file    Physically abused: Not on file    Forced sexual activity: Not on file  Other Topics Concern  . Not on file  Social History Narrative  . Not on file    Family History:   Brother, MI 59 yo *History reviewed. No pertinent family history.   ROS:  Please see the history of present illness.   All other ROS reviewed and negative.     Physical Exam/Data:   Vitals:   03/20/18 0413 03/20/18 0611 03/20/18 0757 03/20/18 0952  BP: (!) 149/74 (!) 151/92 (!) 156/77 139/77  Pulse: (!) 57 (!) 49 (!) 49 (!) 44  Resp: 20 20 16 18   Temp: 98 F (36.7 C) 98 F (36.7 C) 98 F (36.7 C) 97.6 F (36.4 C)  TempSrc: Oral Oral Oral Oral  SpO2: 98% 99% 99% 100%  Weight:      Height:        Intake/Output Summary (Last 24 hours) at 03/20/2018 1046 Last data filed at 03/20/2018 0908 Gross per 24 hour  Intake -  Output 350 ml  Net -350 ml   Filed Weights   03/19/18 1917  Weight: 90.6 kg   Body mass index is 32.24 kg/m.  General:  Well nourished, well developed, in no acute distress. Asleep in recliner. Daughter Doctor, general practice) present and sister Clinton Knight) on phone. Patient somnolent on exam HEENT: normal Lymph: no adenopathy Neck: JVD difficult to assess while in chair, possibly mildly  elevated Endocrine:  No thryomegaly Vascular: No carotid bruits Cardiac:  normal S1, S2; RRR; flow murmur likely related to elevated BP Lungs:  Coarse breath sounds, no wheezing, rhonchi or rales  Abd: soft, nontender, no hepatomegaly  Ext: no edema Musculoskeletal:  No deformities Skin: warm and dry  Neuro:  Slurred speech, closed R eye Psych:  Normal affect - somnolent and sleeping periodically on exam  EKG:  The EKG was personally reviewed and demonstrates: As in HPI Telemetry:  Telemetry was personally reviewed and demonstrates:  Telemetry reviewed showing sinus rhythm  with second-degree AV block type I, also with rare secondary AV block type II, likely when he is sleeping & Sinus pause at 6:59AM of 3.48 seconds  Relevant CV Studies: Pending echo  Laboratory Data:  Chemistry Recent Labs  Lab 03/19/18 1921  NA 142  K 3.8  CL 104  CO2 27  GLUCOSE 104*  BUN 13  CREATININE 1.11  CALCIUM 9.4  GFRNONAA >60  GFRAA >60  ANIONGAP 11    Recent Labs  Lab 03/19/18 1921  PROT 7.6  ALBUMIN 3.9  AST 31  ALT 30  ALKPHOS 99  BILITOT 0.5   Hematology Recent Labs  Lab 03/19/18 1921  WBC 10.3  RBC 5.31  HGB 15.0  HCT 46.8  MCV 88.1  MCH 28.2  MCHC 32.1  RDW 14.1  PLT 229   Cardiac Enzymes Recent Labs  Lab 03/19/18 1921  TROPONINI <0.03   No results for input(s): TROPIPOC in the last 168 hours.  BNPNo results for input(s): BNP, PROBNP in the last 168 hours.  DDimer No results for input(s): DDIMER in the last 168 hours.  Radiology/Studies:  Ct Angio Head W Or Wo Contrast  Result Date: 03/19/2018 CLINICAL DATA:  Weakness, slurred speech and confusion. EXAM: CT ANGIOGRAPHY HEAD AND NECK CT PERFUSION BRAIN TECHNIQUE: Multidetector CT imaging of the head and neck was performed using the standard protocol during bolus administration of intravenous contrast. Multiplanar CT image reconstructions and MIPs were obtained to evaluate the vascular anatomy. Carotid stenosis  measurements (when applicable) are obtained utilizing NASCET criteria, using the distal internal carotid diameter as the denominator. Multiphase CT imaging of the brain was performed following IV bolus contrast injection. Subsequent parametric perfusion maps were calculated using RAPID software. CONTRAST:  ISOVUE-370 IOPAMIDOL (ISOVUE-370) INJECTION 76% COMPARISON:  None. FINDINGS: CTA NECK FINDINGS AORTIC ARCH: There is mild calcific atherosclerosis of the aortic arch. There is no aneurysm, dissection or hemodynamically significant stenosis of the visualized ascending aorta and aortic arch. Conventional 3 vessel aortic branching pattern. The visualized proximal subclavian arteries are widely patent. RIGHT CAROTID SYSTEM: --Common carotid artery: Widely patent origin without common carotid artery dissection or aneurysm. --Internal carotid artery: No dissection, occlusion or aneurysm. Mild atherosclerotic calcification at the carotid bifurcation without hemodynamically significant stenosis. --External carotid artery: No acute abnormality. LEFT CAROTID SYSTEM: --Common carotid artery: Widely patent origin without common carotid artery dissection or aneurysm. --Internal carotid artery:No dissection, occlusion or aneurysm. Mild atherosclerotic calcification at the carotid bifurcation without hemodynamically significant stenosis. --External carotid artery: No acute abnormality. VERTEBRAL ARTERIES: Left dominant configuration. Both origins are normal. There is mild-to-moderate narrowing of both V4 segments. SKELETON: There is no bony spinal canal stenosis. No lytic or blastic lesion. OTHER NECK: Normal pharynx, larynx and major salivary glands. No cervical lymphadenopathy. Unremarkable thyroid gland. UPPER CHEST: No pneumothorax or pleural effusion. No nodules or masses. CTA HEAD FINDINGS ANTERIOR CIRCULATION: --Intracranial internal carotid arteries: Moderate atherosclerotic calcification of both internal carotid  arteries at the skull base. --Anterior cerebral arteries: Normal. Both A1 segments are present. Patent anterior communicating artery. --Middle cerebral arteries: Normal. --Posterior communicating arteries: Absent bilaterally. POSTERIOR CIRCULATION: --Basilar artery: Normal. --Posterior cerebral arteries: Normal. --Superior cerebellar arteries: Normal. --Inferior cerebellar arteries: Normal anterior and posterior inferior cerebellar arteries. VENOUS SINUSES: As permitted by contrast timing, patent. ANATOMIC VARIANTS: None DELAYED PHASE: No parenchymal contrast enhancement. Review of the MIP images confirms the above findings. CT Brain Perfusion Findings: CBF (<30%) Volume: 0mL Perfusion (Tmax>6.0s) volume: 0mL Mismatch Volume: 0 mL Infarction  Location:none IMPRESSION: 1. No acute ischemia by CT perfusion criteria. 2. No emergent large vessel occlusion or high-grade stenosis. 3. Moderate atherosclerotic calcification of the internal carotid and vertebral arteries at the skull base. Electronically Signed   By: Deatra Robinson M.D.   On: 03/19/2018 23:08   Ct Angio Neck W And/or Wo Contrast  Result Date: 03/19/2018 CLINICAL DATA:  Weakness, slurred speech and confusion. EXAM: CT ANGIOGRAPHY HEAD AND NECK CT PERFUSION BRAIN TECHNIQUE: Multidetector CT imaging of the head and neck was performed using the standard protocol during bolus administration of intravenous contrast. Multiplanar CT image reconstructions and MIPs were obtained to evaluate the vascular anatomy. Carotid stenosis measurements (when applicable) are obtained utilizing NASCET criteria, using the distal internal carotid diameter as the denominator. Multiphase CT imaging of the brain was performed following IV bolus contrast injection. Subsequent parametric perfusion maps were calculated using RAPID software. CONTRAST:  ISOVUE-370 IOPAMIDOL (ISOVUE-370) INJECTION 76% COMPARISON:  None. FINDINGS: CTA NECK FINDINGS AORTIC ARCH: There is mild calcific  atherosclerosis of the aortic arch. There is no aneurysm, dissection or hemodynamically significant stenosis of the visualized ascending aorta and aortic arch. Conventional 3 vessel aortic branching pattern. The visualized proximal subclavian arteries are widely patent. RIGHT CAROTID SYSTEM: --Common carotid artery: Widely patent origin without common carotid artery dissection or aneurysm. --Internal carotid artery: No dissection, occlusion or aneurysm. Mild atherosclerotic calcification at the carotid bifurcation without hemodynamically significant stenosis. --External carotid artery: No acute abnormality. LEFT CAROTID SYSTEM: --Common carotid artery: Widely patent origin without common carotid artery dissection or aneurysm. --Internal carotid artery:No dissection, occlusion or aneurysm. Mild atherosclerotic calcification at the carotid bifurcation without hemodynamically significant stenosis. --External carotid artery: No acute abnormality. VERTEBRAL ARTERIES: Left dominant configuration. Both origins are normal. There is mild-to-moderate narrowing of both V4 segments. SKELETON: There is no bony spinal canal stenosis. No lytic or blastic lesion. OTHER NECK: Normal pharynx, larynx and major salivary glands. No cervical lymphadenopathy. Unremarkable thyroid gland. UPPER CHEST: No pneumothorax or pleural effusion. No nodules or masses. CTA HEAD FINDINGS ANTERIOR CIRCULATION: --Intracranial internal carotid arteries: Moderate atherosclerotic calcification of both internal carotid arteries at the skull base. --Anterior cerebral arteries: Normal. Both A1 segments are present. Patent anterior communicating artery. --Middle cerebral arteries: Normal. --Posterior communicating arteries: Absent bilaterally. POSTERIOR CIRCULATION: --Basilar artery: Normal. --Posterior cerebral arteries: Normal. --Superior cerebellar arteries: Normal. --Inferior cerebellar arteries: Normal anterior and posterior inferior cerebellar arteries.  VENOUS SINUSES: As permitted by contrast timing, patent. ANATOMIC VARIANTS: None DELAYED PHASE: No parenchymal contrast enhancement. Review of the MIP images confirms the above findings. CT Brain Perfusion Findings: CBF (<30%) Volume: 0mL Perfusion (Tmax>6.0s) volume: 0mL Mismatch Volume: 0 mL Infarction Location:none IMPRESSION: 1. No acute ischemia by CT perfusion criteria. 2. No emergent large vessel occlusion or high-grade stenosis. 3. Moderate atherosclerotic calcification of the internal carotid and vertebral arteries at the skull base. Electronically Signed   By: Deatra Robinson M.D.   On: 03/19/2018 23:08   Mr Brain Wo Contrast  Result Date: 03/19/2018 CLINICAL DATA:  Headache and left-sided weakness EXAM: MRI HEAD WITHOUT CONTRAST TECHNIQUE: Multiplanar, multiecho pulse sequences of the brain and surrounding structures were obtained without intravenous contrast. COMPARISON:  Head CT from earlier today FINDINGS: Brain: Acute infarct in the right pons, respecting midline. Although signal is weak, there is definite restricted diffusion. Remote lateral lenticulostriate infarcts bilaterally, affecting putamen and deep white matter tracts. Mild chronic small vessel ischemic gliosis in the cerebral white matter. Small remote left cerebellar infarct. No acute  hemorrhage, hydrocephalus, or collection. Vascular: Major flow voids are preserved Skull and upper cervical spine: No evidence of marrow lesion Sinuses/Orbits: Negative IMPRESSION: 1. Acute infarct in the right pons. 2. Remote basal ganglia and left cerebellar infarcts. Electronically Signed   By: Marnee Spring M.D.   On: 03/19/2018 21:41   Ct Cerebral Perfusion W Contrast  Result Date: 03/19/2018 CLINICAL DATA:  Weakness, slurred speech and confusion. EXAM: CT ANGIOGRAPHY HEAD AND NECK CT PERFUSION BRAIN TECHNIQUE: Multidetector CT imaging of the head and neck was performed using the standard protocol during bolus administration of intravenous  contrast. Multiplanar CT image reconstructions and MIPs were obtained to evaluate the vascular anatomy. Carotid stenosis measurements (when applicable) are obtained utilizing NASCET criteria, using the distal internal carotid diameter as the denominator. Multiphase CT imaging of the brain was performed following IV bolus contrast injection. Subsequent parametric perfusion maps were calculated using RAPID software. CONTRAST:  ISOVUE-370 IOPAMIDOL (ISOVUE-370) INJECTION 76% COMPARISON:  None. FINDINGS: CTA NECK FINDINGS AORTIC ARCH: There is mild calcific atherosclerosis of the aortic arch. There is no aneurysm, dissection or hemodynamically significant stenosis of the visualized ascending aorta and aortic arch. Conventional 3 vessel aortic branching pattern. The visualized proximal subclavian arteries are widely patent. RIGHT CAROTID SYSTEM: --Common carotid artery: Widely patent origin without common carotid artery dissection or aneurysm. --Internal carotid artery: No dissection, occlusion or aneurysm. Mild atherosclerotic calcification at the carotid bifurcation without hemodynamically significant stenosis. --External carotid artery: No acute abnormality. LEFT CAROTID SYSTEM: --Common carotid artery: Widely patent origin without common carotid artery dissection or aneurysm. --Internal carotid artery:No dissection, occlusion or aneurysm. Mild atherosclerotic calcification at the carotid bifurcation without hemodynamically significant stenosis. --External carotid artery: No acute abnormality. VERTEBRAL ARTERIES: Left dominant configuration. Both origins are normal. There is mild-to-moderate narrowing of both V4 segments. SKELETON: There is no bony spinal canal stenosis. No lytic or blastic lesion. OTHER NECK: Normal pharynx, larynx and major salivary glands. No cervical lymphadenopathy. Unremarkable thyroid gland. UPPER CHEST: No pneumothorax or pleural effusion. No nodules or masses. CTA HEAD FINDINGS ANTERIOR  CIRCULATION: --Intracranial internal carotid arteries: Moderate atherosclerotic calcification of both internal carotid arteries at the skull base. --Anterior cerebral arteries: Normal. Both A1 segments are present. Patent anterior communicating artery. --Middle cerebral arteries: Normal. --Posterior communicating arteries: Absent bilaterally. POSTERIOR CIRCULATION: --Basilar artery: Normal. --Posterior cerebral arteries: Normal. --Superior cerebellar arteries: Normal. --Inferior cerebellar arteries: Normal anterior and posterior inferior cerebellar arteries. VENOUS SINUSES: As permitted by contrast timing, patent. ANATOMIC VARIANTS: None DELAYED PHASE: No parenchymal contrast enhancement. Review of the MIP images confirms the above findings. CT Brain Perfusion Findings: CBF (<30%) Volume: 0mL Perfusion (Tmax>6.0s) volume: 0mL Mismatch Volume: 0 mL Infarction Location:none IMPRESSION: 1. No acute ischemia by CT perfusion criteria. 2. No emergent large vessel occlusion or high-grade stenosis. 3. Moderate atherosclerotic calcification of the internal carotid and vertebral arteries at the skull base. Electronically Signed   By: Deatra Robinson M.D.   On: 03/19/2018 23:08   Ct Head Code Stroke Wo Contrast  Result Date: 03/19/2018 CLINICAL DATA:  Code stroke. Sudden onset of weakness and slurred speech beginning 1 hour ago EXAM: CT HEAD WITHOUT CONTRAST TECHNIQUE: Contiguous axial images were obtained from the base of the skull through the vertex without intravenous contrast. COMPARISON:  None. FINDINGS: Brain: No evidence of acute infarction, hemorrhage, hydrocephalus, extra-axial collection or mass lesion/mass effect. Remote appearing bilateral basal ganglia infarcts. Small remote left cerebellar infarct. Vascular: Atherosclerotic calcification.  No hyperdense vessel. Skull: Negative Sinuses/Orbits: Negative Other:  These results were called by telephone at the time of interpretation on 03/19/2018 at 7:23 pm to Dr.  Darnelle Catalan, who verbally acknowledged these results. ASPECTS [facility] Stroke Program Early CT Score) -when accounting for chronic infarct. - Ganglionic level infarction (caudate, lentiform nuclei, internal capsule, insula, M1-M3 cortex): 7 - Supraganglionic infarction (M4-M6 cortex): 3 Total score (0-10 with 10 being normal): 10 IMPRESSION: 1. No acute finding. 2. Remote bilateral basal ganglia and left cerebellar infarcts. Electronically Signed   By: Marnee Spring M.D.   On: 03/19/2018 19:25    Assessment and Plan:   Sinus Pause while sleeping in the setting of second degree heart block type I and rare second degree HB type II while asleep  in the setting of CVA - Asymptomatic - No current chest pain.  - As in HPI, patient saw cardiologist in past for h/o chest pain but reports workup negative. - Telemetry: Bradycardic rates. Rhythm: Wenkebach and rare second degree HB type II while asleep. Sinus pause 3.48 seconds at 6:59AM while asleep and s/p acute stroke. Continue to monitor on telemetry   - Continue ASA 81mg  daily - Continue lipitor / statin. AST and ALT within normal range. Unable to calculate LDL d/t hypertriglyceridemia per 03/20/18 labs. HDL 33. Total cholesterol 156. - Recommend ACEi / lisinopril as below. - Recommend sleep study given the sinus pause and second degree HB type II occur when asleep. Recommend continued telemetry. - As above no current CP but consider outpatient cardiac workup as c/o intermittent chest pain over the last several years despite previous negative workup. Patient at risk for cardiac disease given family history of sudden cardiac death in brother at age 50 as well as patient's cocaine/nicotine/alcohol abuse, HTN, DM, hypertriglyceridemia. Recommend LDL update once triglycerides allow calculation. - Further recommendations pending echocardiogram results.   CVA with PMH of stroke - Evidence of 3 prior strokes on imaging - CT with moderate atherosclerosis of  carotid and vertebral arteries - Continue ASA, statin. Recommend start antihypertensive as elevated BP and DM2 and agree with recommendation for ACEi (Lisinopril) given BP with h/o DM2. Will plan for start pending further neurology workup / echo.   - Per Neurology, IM  HTN - Agree with recommendation for antihypertensive of Lisinopril, given h/o DM2 and elevated BP.  - Caution with bradycardia- would not recommend BB at this time. - Recommend start of ACEi pending further neurology workup for CVA and results of echo. Consider further antihypertensive tx if needed. - Continue to monitor BP, HR  AKI vs acute on chronic kidney disease - Cr 1.11 - Continue to monitor with daily BMET - Recommend caution with contrast procedures. Avoid nephrotoxic medications. Consider renal dosage for medications if needed. - Continue to monitor electrolytes: K 3.8. Consider checking Mg  Hypokalemia - K 3.8 - Replete with goal 4.0 - Check Mg  Hypertriglyceridemia  - Triglycerides 511 - At risk for pancreatitis. H/o alcoholism. - Started on fenofibrate  - Further workup and management per IM  DM2 - Per daughter, patient on home DM2 medication - Consider SSI - ACE recommended as above - Diet recommendations - heart healthy, carb modified - Per IM  Nicotine, cocaine, alcohol abuse - Cessation counseled  - + for cocaine at presentation per tox screen - On nicotine patch - Per IM   For questions or updates, please contact [facility] Please consult [email or web address] for contact info under     Signed, Lennon Alstrom, PA-C  03/20/2018 10:46 AM

## 2018-03-20 NOTE — Evaluation (Signed)
Occupational Therapy Evaluation Patient Details Name: Clinton Knight MRN: [id number] DOB: [birth year] Today's Date: 03/20/2018    History of Present Illness Pt 57yo male presents to ED with discomfort behind R eye and slurring speech, soon after had sx of L sided weakness and sensory deficit, admitted for CVA on 10/82019. 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 Pt's PMH includes CVA. Per pt not hx of falls.   Clinical Impression   Pt seen for OT evaluation this date. Prior to hospital admission, pt was independent in all aspects of ADL/IADL and mobility. Pt recently in between homes but has family he can stay with. Currently pt presents with increased fatigue, impaired activity tolerance, L side weakness, impaired sensation/coordination of LUE, impaired balance, and safety impairing his ability to safely perform ADL and mobility tasks at previous independence. Pt requires MOD assist +2 for transfers and ambulating to bed with RW and cues. Pt performed seated grooming tasks with set up and CGA for sitting balance with no physical assist required for pt to grasp toothpaste with L hand and using R hand to twist the cap off. While seated EOB, pt able to bear weight through LUE to support sitting balance with cues. Pt expressed frustration with having had the stroke and stated he felt "helpless." With repetition, pt able to improve LUE performance during coordination task. Pt will benefit from skilled OT services to address noted impairments and functional deficits in order to maximize safety and return to PLOF/independence. Recommend acute, inpatient rehab to maximize return to PLOF.     Follow Up Recommendations  CIR    Equipment Recommendations  Other (comment)(TBD)    Recommendations for Other Services Rehab consult     Precautions /  Restrictions Precautions Precautions: Fall Restrictions Weight Bearing Restrictions: No      Mobility Bed Mobility Overal bed mobility: Needs Assistance Bed Mobility: Sit to Supine       Sit to supine: Min assist   General bed mobility comments: min assist for LLE mgt   Transfers Overall transfer level: Needs assistance Equipment used: Rolling walker (2 wheeled) Transfers: Sit to/from Stand Sit to Stand: Mod assist;+2 physical assistance         General transfer comment: heavy VC for sequencing and RW safety    Balance Overall balance assessment: Needs assistance Sitting-balance support: Single extremity supported;Feet supported(RUE) Sitting balance-Leahy Scale: Fair Sitting balance - Comments: mild swaying, pt very sleepy initially Postural control: Left lateral lean Standing balance support: Bilateral upper extremity supported Standing balance-Leahy Scale: Poor                             ADL either performed or assessed with clinical judgement   ADL Overall ADL's : Needs assistance/impaired Eating/Feeding: Sitting;Set up;Minimal assistance Eating/Feeding Details (indicate cue type and reason): PRN min assist for bilat tasks Grooming: Sitting;Supervision/safety;Oral care;Set up Grooming Details (indicate cue type and reason): seated EOB, pt able to manipulate toothpaste and toothbrush with additional time/effort and occasional cues, fatigued relatively quickly  Upper Body Bathing: Sitting;Minimal assistance   Lower Body Bathing: Sit to/from stand;Minimal assistance;Moderate assistance   Upper Body Dressing : Sitting;Minimal assistance   Lower Body Dressing: Sit to/from stand;Moderate assistance;Minimal assistance   Toilet Transfer: BSC;Ambulation;RW;Moderate assistance;+2 for physical assistance Toilet Transfer Details (indicate cue type and reason): cues for  RW placement                 Vision Patient Visual Report: No change from  baseline       Perception     Praxis      Pertinent Vitals/Pain Pain Assessment: No/denies pain Pain Intervention(s): Monitored during session     Hand Dominance Right   Extremity/Trunk Assessment Upper Extremity Assessment Upper Extremity Assessment: LUE deficits/detail(RUE WFL) LUE Deficits / Details: 3/5 t/o, impaired sensation and coordination with testing LUE Sensation: decreased light touch LUE Coordination: decreased fine motor   Lower Extremity Assessment Lower Extremity Assessment: Defer to PT evaluation;LLE deficits/detail(RLE WFL) LLE Deficits / Details: 3/5 LLE Sensation: WNL LLE Coordination: decreased gross motor   Cervical / Trunk Assessment Cervical / Trunk Assessment: Normal   Communication Communication Communication: Other (comment)(dysarthric)   Cognition Arousal/Alertness: Awake/alert Behavior During Therapy: WFL for tasks assessed/performed Overall Cognitive Status: Within Functional Limits for tasks assessed                                 General Comments: initially very sleepy, requiring cues to improve alertness. Oriented x4, follows commands well.    General Comments  Swelling noted near pt's L ear. No redness, not warm to touch, not painful per pt. RN notified and assessed at end of session.     Exercises Other Exercises Other Exercises: Pt instructed in LUE [facility] exercises    Shoulder Instructions      Home Living Family/patient expects to be discharged to:: Unsure                                 Additional Comments: At this date pt states they are between places and do not know where they will go at dc.      Prior Functioning/Environment Level of Independence: Independent        Comments: Per pt he was independent with all mobility, ADL, and IADL. Eager and actively looking for work but not currently employed.         OT Problem List: Decreased strength;Decreased knowledge of use of DME or  AE;Decreased coordination;Decreased range of motion;Impaired UE functional use;Decreased activity tolerance;Impaired balance (sitting and/or standing);Decreased safety awareness;Impaired sensation      OT Treatment/Interventions: Self-care/ADL training;Balance training;Therapeutic exercise;Therapeutic activities;Neuromuscular education;DME and/or AE instruction;Patient/family education    OT Goals(Current goals can be found in the care plan section) Acute Rehab OT Goals Patient Stated Goal: To be independent again. OT Goal Formulation: With patient/family Time For Goal Achievement: 04/03/18 Potential to Achieve Goals: Good ADL Goals Pt Will Perform Grooming: standing;with min guard assist Pt Will Perform Upper Body Dressing: sitting;with min guard assist Pt Will Perform Lower Body Dressing: with min assist;sit to/from stand Pt Will Transfer to Toilet: ambulating;with min guard assist(LRAD for amb, elevated toilet) Additional ADL Goal #1: Pt will perform table top ADL/IADL activities incorporating LUE into task with CGA for LUE.  OT Frequency: Min 3X/week   Barriers to D/C:            Co-evaluation              AM-PAC PT "6 Clicks" Daily Activity     Outcome Measure Help from another person eating meals?: A Little Help from another person taking care of personal grooming?: A Little Help from another person toileting, which includes using toliet, bedpan,  or urinal?: A Lot Help from another person bathing (including washing, rinsing, drying)?: A Lot Help from another person to put on and taking off regular upper body clothing?: A Lot Help from another person to put on and taking off regular lower body clothing?: A Lot 6 Click Score: 14   End of Session Equipment Utilized During Treatment: Gait belt;Rolling walker Nurse Communication: Other (comment)(swelling on L side of cheek near ear)  Activity Tolerance: Patient tolerated treatment well Patient left: in bed;with call  bell/phone within reach;with bed alarm set;with family/visitor present  OT Visit Diagnosis: Other abnormalities of gait and mobility (R26.89);Hemiplegia and hemiparesis Hemiplegia - Right/Left: Left Hemiplegia - dominant/non-dominant: Non-Dominant Hemiplegia - caused by: Cerebral infarction                Time: 2130-8657 OT Time Calculation (min): 28 min Charges:  OT General Charges $OT Visit: 1 Visit OT Evaluation $OT Eval Moderate Complexity: 1 Mod OT Treatments $Self Care/Home Management : 8-22 mins  Richrd Prime, MPH, MS, OTR/L ascom [phone] 03/20/18, 3:32 PM

## 2018-03-20 NOTE — Consult Note (Signed)
Referring Physician: Enedina Finner    Chief Complaint: Left-sided weakness, left facial droop, slurred speech and headache  HPI: Clinton Knight is an 57 y.o. male pertinent history of diabetes mellitus currently not on medication, strokes x3 and smoking presenting to the ED on 03/19/2018 with complaints of left-sided weakness and numbness, left facial droop, slurred speech and confusion.  Reports that he was not feeling well the whole day yesterday, he was at his sister's place when he suddenly developed the symptoms at around 5 pm.  His daughter who is currently at the bedside, patient complained of headache on his right forehead and then stopped eating. They noticed that his speech was very slurred with difficulty getting his words out.  Patient states that he tried to ambulate to his car but his left side gave out and he fell to the ground without hitting his head.  Initial CT head did not show acute finding, however remote bilateral basal ganglia and left cerebellar infarcts was noted.  Follow-up MRI showed acute infarct in the right pons.  Patient was evaluated by tele-neurologist who deemed that patient was not a candidate for TPA due to mild and not disabling deficit.  Initial NIH stroke scale 7.  He was admitted for further stroke work-up.  Date last known well: Date: 03/19/2018 Time last known well: Time: 18:00 tPA Given: No: due to mild and nondisabling symptoms  Past Medical History:  Diagnosis Date  . Stroke [facility])     Past Surgical History:  Procedure Laterality Date  . NO PAST SURGERIES      Family History  Problem Relation Age of Onset  . Diabetes Mother   . Stroke Mother   . Hypertension Mother    Social History:  reports that he has been smoking cigarettes. He has been smoking about 1.00 pack per day. He has never used smokeless tobacco. He reports that he drinks alcohol. He reports that he does not use drugs.  Allergies: No Known Allergies  Medications:  I have reviewed the  patient's current medications. Prior to Admission:  No medications prior to admission.   Scheduled: . aspirin  81 mg Oral Daily  . atorvastatin  40 mg Oral q1800  . enoxaparin (LOVENOX) injection  40 mg Subcutaneous Q24H  . fenofibrate  160 mg Oral Daily  . mouth rinse  15 mL Mouth Rinse BID  . nicotine  21 mg Transdermal Daily    ROS: History obtained from the patient   General ROS: negative for - chills, fatigue, fever, night sweats, weight gain or weight loss Psychological ROS: negative for - behavioral disorder, hallucinations, memory difficulties, mood swings or suicidal ideation Ophthalmic ROS: negative for - blurry vision, double vision, eye pain or loss of vision ENT ROS: negative for - epistaxis, nasal discharge, oral lesions, sore throat, tinnitus or vertigo Allergy and Immunology ROS: negative for - hives or itchy/watery eyes Hematological and Lymphatic ROS: negative for - bleeding problems, bruising or swollen lymph nodes Endocrine ROS: negative for - galactorrhea, hair pattern changes, polydipsia/polyuria or temperature intolerance Respiratory ROS: negative for - cough, hemoptysis, shortness of breath or wheezing Cardiovascular ROS: negative for - chest pain, dyspnea on exertion, edema or irregular heartbeat Gastrointestinal ROS: negative for - abdominal pain, diarrhea, hematemesis, nausea/vomiting or stool incontinence Genito-Urinary ROS: negative for - dysuria, hematuria, incontinence or urinary frequency/urgency Musculoskeletal ROS: negative for - joint swelling or muscular weakness Neurological ROS: as noted in HPI Dermatological ROS: negative for rash and skin lesion changes  Physical Examination: Blood pressure 133/77, pulse (!) 51, temperature 98.2 F (36.8 C), temperature source Oral, resp. rate 16, height 5\' 6"  (1.676 m), weight 90.6 kg, SpO2 99 %.   HEENT-  Normocephalic, no lesions, without obvious abnormality.  Normal external eye and conjunctiva.  Normal  TM's bilaterally.  Normal auditory canals and external ears. Normal external nose, mucus membranes and septum.  Normal pharynx. Cardiovascular- S1, S2 normal, pulses palpable throughout   Lungs- chest clear, no wheezing, rales, normal symmetric air entry Abdomen- soft, non-tender; bowel sounds normal; no masses,  no organomegaly Extremities- no edema Lymph-no adenopathy palpable Musculoskeletal-no joint tenderness, deformity or swelling Skin-warm and dry, no hyperpigmentation, vitiligo, or suspicious lesions  Neurological Exam   Mental Status: Alert, oriented, thought content appropriate. Moderate dysarthria.  Able to follow 3 step commands without difficulty. Attention span and concentration seemed appropriate  Cranial Nerves: II: Discs flat bilaterally; Visual fields grossly normal, pupils equal, round, reactive to light and accommodation III,IV, VI: ptosis not present, extra-ocular motions intact bilaterally V,VII: Left facial droop, facial light touch sensation  decreased on the left VIII: hearing normal bilaterally IX,X: gag reflex deferred XI: bilateral shoulder shrug XII: midline tongue extension Motor: Right :  Upper extremity   5/5 Without pronator drift      Left: Upper extremity   3+/5 without pronator drift Right:   Lower extremity   5/5                                          Left: Lower extremity   3+/5 Tone and bulk:normal tone throughout; no atrophy noted Sensory: Pinprick and light touch  decreased on the left Deep Tendon Reflexes: 2+ and symmetric throughout Plantars: Right:  Downgoing                             Left: Upgoing Cerebellar: Finger-to-nose testing intact bilaterally. Heel to shin testing normal bilaterally Gait: not tested due to safety concerns  Data Reviewed  Laboratory Studies:  Basic Metabolic Panel: Recent Labs  Lab 03/19/18 1921  NA 142  K 3.8  CL 104  CO2 27  GLUCOSE 104*  BUN 13  CREATININE 1.11  CALCIUM 9.4    Liver Function  Tests: Recent Labs  Lab 03/19/18 1921  AST 31  ALT 30  ALKPHOS 99  BILITOT 0.5  PROT 7.6  ALBUMIN 3.9   No results for input(s): LIPASE, AMYLASE in the last 168 hours. No results for input(s): AMMONIA in the last 168 hours.  CBC: Recent Labs  Lab 03/19/18 1921  WBC 10.3  NEUTROABS 4.8  HGB 15.0  HCT 46.8  MCV 88.1  PLT 229    Cardiac Enzymes: Recent Labs  Lab 03/19/18 1921  TROPONINI <0.03    BNP: Invalid input(s): POCBNP  CBG: Recent Labs  Lab 03/19/18 1917  GLUCAP 98    Microbiology: No results found for this or any previous visit.  Coagulation Studies: Recent Labs    03/19/18 1921  LABPROT 11.7  INR 0.87    Urinalysis:  Recent Labs  Lab 03/20/18 0015  COLORURINE YELLOW*  LABSPEC 1.041*  PHURINE 8.0  GLUCOSEU NEGATIVE  HGBUR NEGATIVE  BILIRUBINUR NEGATIVE  KETONESUR NEGATIVE  PROTEINUR NEGATIVE  NITRITE NEGATIVE  LEUKOCYTESUR NEGATIVE    Lipid Panel:    Component Value Date/Time   CHOL 156  03/20/2018 0517   TRIG 511 (H) 03/20/2018 0517   HDL 33 (L) 03/20/2018 0517   CHOLHDL 4.7 03/20/2018 0517   VLDL UNABLE TO CALCULATE IF TRIGLYCERIDE OVER 400 mg/dL 40/98/1191 4782   LDLCALC UNABLE TO CALCULATE IF TRIGLYCERIDE OVER 400 mg/dL 95/62/1308 6578    IONG2X:  Lab Results  Component Value Date   HGBA1C 6.4 (H) 03/20/2018    Urine Drug Screen:      Component Value Date/Time   LABOPIA NONE DETECTED 03/20/2018 0015   COCAINSCRNUR POSITIVE (A) 03/20/2018 0015   LABBENZ NONE DETECTED 03/20/2018 0015   AMPHETMU NONE DETECTED 03/20/2018 0015   THCU NONE DETECTED 03/20/2018 0015   LABBARB NONE DETECTED 03/20/2018 0015    Alcohol Level: No results for input(s): ETH in the last 168 hours.  Other results: EKG: normal EKG, normal sinus rhythm, unchanged from previous tracings. Vent. rate 53 BPM PR interval * ms QRS duration 106 ms QT/QTc 443/416 ms P-R-T axes 62 82 57  Imaging: Ct Angio Head W Or Wo Contrast  Result Date:  03/19/2018 CLINICAL DATA:  Weakness, slurred speech and confusion. EXAM: CT ANGIOGRAPHY HEAD AND NECK CT PERFUSION BRAIN TECHNIQUE: Multidetector CT imaging of the head and neck was performed using the standard protocol during bolus administration of intravenous contrast. Multiplanar CT image reconstructions and MIPs were obtained to evaluate the vascular anatomy. Carotid stenosis measurements (when applicable) are obtained utilizing NASCET criteria, using the distal internal carotid diameter as the denominator. Multiphase CT imaging of the brain was performed following IV bolus contrast injection. Subsequent parametric perfusion maps were calculated using RAPID software. CONTRAST:  ISOVUE-370 IOPAMIDOL (ISOVUE-370) INJECTION 76% COMPARISON:  None. FINDINGS: CTA NECK FINDINGS AORTIC ARCH: There is mild calcific atherosclerosis of the aortic arch. There is no aneurysm, dissection or hemodynamically significant stenosis of the visualized ascending aorta and aortic arch. Conventional 3 vessel aortic branching pattern. The visualized proximal subclavian arteries are widely patent. RIGHT CAROTID SYSTEM: --Common carotid artery: Widely patent origin without common carotid artery dissection or aneurysm. --Internal carotid artery: No dissection, occlusion or aneurysm. Mild atherosclerotic calcification at the carotid bifurcation without hemodynamically significant stenosis. --External carotid artery: No acute abnormality. LEFT CAROTID SYSTEM: --Common carotid artery: Widely patent origin without common carotid artery dissection or aneurysm. --Internal carotid artery:No dissection, occlusion or aneurysm. Mild atherosclerotic calcification at the carotid bifurcation without hemodynamically significant stenosis. --External carotid artery: No acute abnormality. VERTEBRAL ARTERIES: Left dominant configuration. Both origins are normal. There is mild-to-moderate narrowing of both V4 segments. SKELETON: There is no bony  spinal canal stenosis. No lytic or blastic lesion. OTHER NECK: Normal pharynx, larynx and major salivary glands. No cervical lymphadenopathy. Unremarkable thyroid gland. UPPER CHEST: No pneumothorax or pleural effusion. No nodules or masses. CTA HEAD FINDINGS ANTERIOR CIRCULATION: --Intracranial internal carotid arteries: Moderate atherosclerotic calcification of both internal carotid arteries at the skull base. --Anterior cerebral arteries: Normal. Both A1 segments are present. Patent anterior communicating artery. --Middle cerebral arteries: Normal. --Posterior communicating arteries: Absent bilaterally. POSTERIOR CIRCULATION: --Basilar artery: Normal. --Posterior cerebral arteries: Normal. --Superior cerebellar arteries: Normal. --Inferior cerebellar arteries: Normal anterior and posterior inferior cerebellar arteries. VENOUS SINUSES: As permitted by contrast timing, patent. ANATOMIC VARIANTS: None DELAYED PHASE: No parenchymal contrast enhancement. Review of the MIP images confirms the above findings. CT Brain Perfusion Findings: CBF (<30%) Volume: 0mL Perfusion (Tmax>6.0s) volume: 0mL Mismatch Volume: 0 mL Infarction Location:none IMPRESSION: 1. No acute ischemia by CT perfusion criteria. 2. No emergent large vessel occlusion or high-grade stenosis. 3. Moderate  atherosclerotic calcification of the internal carotid and vertebral arteries at the skull base. Electronically Signed   By: Deatra Robinson M.D.   On: 03/19/2018 23:08   Ct Angio Neck W And/or Wo Contrast  Result Date: 03/19/2018 CLINICAL DATA:  Weakness, slurred speech and confusion. EXAM: CT ANGIOGRAPHY HEAD AND NECK CT PERFUSION BRAIN TECHNIQUE: Multidetector CT imaging of the head and neck was performed using the standard protocol during bolus administration of intravenous contrast. Multiplanar CT image reconstructions and MIPs were obtained to evaluate the vascular anatomy. Carotid stenosis measurements (when applicable) are obtained utilizing  NASCET criteria, using the distal internal carotid diameter as the denominator. Multiphase CT imaging of the brain was performed following IV bolus contrast injection. Subsequent parametric perfusion maps were calculated using RAPID software. CONTRAST:  ISOVUE-370 IOPAMIDOL (ISOVUE-370) INJECTION 76% COMPARISON:  None. FINDINGS: CTA NECK FINDINGS AORTIC ARCH: There is mild calcific atherosclerosis of the aortic arch. There is no aneurysm, dissection or hemodynamically significant stenosis of the visualized ascending aorta and aortic arch. Conventional 3 vessel aortic branching pattern. The visualized proximal subclavian arteries are widely patent. RIGHT CAROTID SYSTEM: --Common carotid artery: Widely patent origin without common carotid artery dissection or aneurysm. --Internal carotid artery: No dissection, occlusion or aneurysm. Mild atherosclerotic calcification at the carotid bifurcation without hemodynamically significant stenosis. --External carotid artery: No acute abnormality. LEFT CAROTID SYSTEM: --Common carotid artery: Widely patent origin without common carotid artery dissection or aneurysm. --Internal carotid artery:No dissection, occlusion or aneurysm. Mild atherosclerotic calcification at the carotid bifurcation without hemodynamically significant stenosis. --External carotid artery: No acute abnormality. VERTEBRAL ARTERIES: Left dominant configuration. Both origins are normal. There is mild-to-moderate narrowing of both V4 segments. SKELETON: There is no bony spinal canal stenosis. No lytic or blastic lesion. OTHER NECK: Normal pharynx, larynx and major salivary glands. No cervical lymphadenopathy. Unremarkable thyroid gland. UPPER CHEST: No pneumothorax or pleural effusion. No nodules or masses. CTA HEAD FINDINGS ANTERIOR CIRCULATION: --Intracranial internal carotid arteries: Moderate atherosclerotic calcification of both internal carotid arteries at the skull base. --Anterior cerebral  arteries: Normal. Both A1 segments are present. Patent anterior communicating artery. --Middle cerebral arteries: Normal. --Posterior communicating arteries: Absent bilaterally. POSTERIOR CIRCULATION: --Basilar artery: Normal. --Posterior cerebral arteries: Normal. --Superior cerebellar arteries: Normal. --Inferior cerebellar arteries: Normal anterior and posterior inferior cerebellar arteries. VENOUS SINUSES: As permitted by contrast timing, patent. ANATOMIC VARIANTS: None DELAYED PHASE: No parenchymal contrast enhancement. Review of the MIP images confirms the above findings. CT Brain Perfusion Findings: CBF (<30%) Volume: 0mL Perfusion (Tmax>6.0s) volume: 0mL Mismatch Volume: 0 mL Infarction Location:none IMPRESSION: 1. No acute ischemia by CT perfusion criteria. 2. No emergent large vessel occlusion or high-grade stenosis. 3. Moderate atherosclerotic calcification of the internal carotid and vertebral arteries at the skull base. Electronically Signed   By: Deatra Robinson M.D.   On: 03/19/2018 23:08   Mr Brain Wo Contrast  Result Date: 03/19/2018 CLINICAL DATA:  Headache and left-sided weakness EXAM: MRI HEAD WITHOUT CONTRAST TECHNIQUE: Multiplanar, multiecho pulse sequences of the brain and surrounding structures were obtained without intravenous contrast. COMPARISON:  Head CT from earlier today FINDINGS: Brain: Acute infarct in the right pons, respecting midline. Although signal is weak, there is definite restricted diffusion. Remote lateral lenticulostriate infarcts bilaterally, affecting putamen and deep white matter tracts. Mild chronic small vessel ischemic gliosis in the cerebral white matter. Small remote left cerebellar infarct. No acute hemorrhage, hydrocephalus, or collection. Vascular: Major flow voids are preserved Skull and upper cervical spine: No evidence of marrow lesion Sinuses/Orbits:  Negative IMPRESSION: 1. Acute infarct in the right pons. 2. Remote basal ganglia and left cerebellar  infarcts. Electronically Signed   By: Marnee Spring M.D.   On: 03/19/2018 21:41   Ct Cerebral Perfusion W Contrast  Result Date: 03/19/2018 CLINICAL DATA:  Weakness, slurred speech and confusion. EXAM: CT ANGIOGRAPHY HEAD AND NECK CT PERFUSION BRAIN TECHNIQUE: Multidetector CT imaging of the head and neck was performed using the standard protocol during bolus administration of intravenous contrast. Multiplanar CT image reconstructions and MIPs were obtained to evaluate the vascular anatomy. Carotid stenosis measurements (when applicable) are obtained utilizing NASCET criteria, using the distal internal carotid diameter as the denominator. Multiphase CT imaging of the brain was performed following IV bolus contrast injection. Subsequent parametric perfusion maps were calculated using RAPID software. CONTRAST:  ISOVUE-370 IOPAMIDOL (ISOVUE-370) INJECTION 76% COMPARISON:  None. FINDINGS: CTA NECK FINDINGS AORTIC ARCH: There is mild calcific atherosclerosis of the aortic arch. There is no aneurysm, dissection or hemodynamically significant stenosis of the visualized ascending aorta and aortic arch. Conventional 3 vessel aortic branching pattern. The visualized proximal subclavian arteries are widely patent. RIGHT CAROTID SYSTEM: --Common carotid artery: Widely patent origin without common carotid artery dissection or aneurysm. --Internal carotid artery: No dissection, occlusion or aneurysm. Mild atherosclerotic calcification at the carotid bifurcation without hemodynamically significant stenosis. --External carotid artery: No acute abnormality. LEFT CAROTID SYSTEM: --Common carotid artery: Widely patent origin without common carotid artery dissection or aneurysm. --Internal carotid artery:No dissection, occlusion or aneurysm. Mild atherosclerotic calcification at the carotid bifurcation without hemodynamically significant stenosis. --External carotid artery: No acute abnormality. VERTEBRAL ARTERIES: Left  dominant configuration. Both origins are normal. There is mild-to-moderate narrowing of both V4 segments. SKELETON: There is no bony spinal canal stenosis. No lytic or blastic lesion. OTHER NECK: Normal pharynx, larynx and major salivary glands. No cervical lymphadenopathy. Unremarkable thyroid gland. UPPER CHEST: No pneumothorax or pleural effusion. No nodules or masses. CTA HEAD FINDINGS ANTERIOR CIRCULATION: --Intracranial internal carotid arteries: Moderate atherosclerotic calcification of both internal carotid arteries at the skull base. --Anterior cerebral arteries: Normal. Both A1 segments are present. Patent anterior communicating artery. --Middle cerebral arteries: Normal. --Posterior communicating arteries: Absent bilaterally. POSTERIOR CIRCULATION: --Basilar artery: Normal. --Posterior cerebral arteries: Normal. --Superior cerebellar arteries: Normal. --Inferior cerebellar arteries: Normal anterior and posterior inferior cerebellar arteries. VENOUS SINUSES: As permitted by contrast timing, patent. ANATOMIC VARIANTS: None DELAYED PHASE: No parenchymal contrast enhancement. Review of the MIP images confirms the above findings. CT Brain Perfusion Findings: CBF (<30%) Volume: 0mL Perfusion (Tmax>6.0s) volume: 0mL Mismatch Volume: 0 mL Infarction Location:none IMPRESSION: 1. No acute ischemia by CT perfusion criteria. 2. No emergent large vessel occlusion or high-grade stenosis. 3. Moderate atherosclerotic calcification of the internal carotid and vertebral arteries at the skull base. Electronically Signed   By: Deatra Robinson M.D.   On: 03/19/2018 23:08   Ct Head Code Stroke Wo Contrast  Result Date: 03/19/2018 CLINICAL DATA:  Code stroke. Sudden onset of weakness and slurred speech beginning 1 hour ago EXAM: CT HEAD WITHOUT CONTRAST TECHNIQUE: Contiguous axial images were obtained from the base of the skull through the vertex without intravenous contrast. COMPARISON:  None. FINDINGS: Brain: No evidence of  acute infarction, hemorrhage, hydrocephalus, extra-axial collection or mass lesion/mass effect. Remote appearing bilateral basal ganglia infarcts. Small remote left cerebellar infarct. Vascular: Atherosclerotic calcification.  No hyperdense vessel. Skull: Negative Sinuses/Orbits: Negative Other: These results were called by telephone at the time of interpretation on 03/19/2018 at 7:23 pm to Dr. Darnelle Catalan, who  verbally acknowledged these results. ASPECTS [facility] Stroke Program Early CT Score) -when accounting for chronic infarct. - Ganglionic level infarction (caudate, lentiform nuclei, internal capsule, insula, M1-M3 cortex): 7 - Supraganglionic infarction (M4-M6 cortex): 3 Total score (0-10 with 10 being normal): 10 IMPRESSION: 1. No acute finding. 2. Remote bilateral basal ganglia and left cerebellar infarcts. Electronically Signed   By: Marnee Spring M.D.   On: 03/19/2018 19:25    Patient seen and examined.  Clinical course and management discussed.  Necessary edits performed.  I agree with the above.  Assessment and plan of care developed and discussed below.     Assessment: 57 y.o. male with pertinent history of diabetes mellitus, prior strokes, smoking and alcohol use presenting with left sided numbness and weakness, left facial droop, headache, and slurred speech.  MRI brain reviewed and shows an acute right pontine infarct with remote basal ganglia and left cerebellar infarcts likely secondary to small vessel disease.  CTA head and neck showing moderate atherosclerosis of the carotids and vertebral arteries with no large vessel occlusion.  Hemoglobin A1c 6.4, LDL not calculated due to elevated triglycerides. Patient was not on anticoagulation or antiplatelet therapy prior to this event.  Stroke Risk Factors - diabetes mellitus, family history, hypertension and smoking  Plan: 1. Prophylactic therapy- Antiplatelet Aspirin 81 mg/day and Plavix 75 mg /day 2. Statin to keep LDL less than 70 mg/dL 3.  PT consult, OT consult, Speech consult 4. Echocardiogram pending 5. NPO until RN stroke swallow screen 6. Telemetry monitoring 7. Frequent neuro checks 8. Smoking cessation counseling  This patient was staffed with Dr. Verlon Au, Thad Ranger who personally evaluated patient, reviewed documentation and agreed with assessment and plan of care as above.  [name], DNP, FNP-BC Board certified Nurse Practitioner Neurology Department  03/20/2018, 1:22 PM  Thana Farr, MD Neurology [phone]  03/20/2018  2:16 PM

## 2018-03-21 DIAGNOSIS — I455 Other specified heart block: Secondary | ICD-10-CM

## 2018-03-21 LAB — ECHOCARDIOGRAM COMPLETE
HEIGHTINCHES: 66 in
Weight: 3195.7882 oz

## 2018-03-21 LAB — BASIC METABOLIC PANEL
Anion gap: 6 (ref 5–15)
BUN: 21 mg/dL — ABNORMAL HIGH (ref 6–20)
CALCIUM: 8.9 mg/dL (ref 8.9–10.3)
CO2: 27 mmol/L (ref 22–32)
CREATININE: 1.2 mg/dL (ref 0.61–1.24)
Chloride: 108 mmol/L (ref 98–111)
GFR calc Af Amer: >60 mL/min (ref >60)
GFR calc non Af Amer: >60 mL/min (ref >60)
Glucose, Bld: 104 mg/dL — ABNORMAL HIGH (ref 70–99)
Potassium: 4.7 mmol/L (ref 3.5–5.1)
SODIUM: 141 mmol/L (ref 135–145)

## 2018-03-21 LAB — MAGNESIUM: Magnesium: 2.1 mg/dL (ref 1.7–2.4)

## 2018-03-21 LAB — HIV ANTIBODY (ROUTINE TESTING W REFLEX): HIV Screen 4th Generation wRfx: NONREACTIVE

## 2018-03-21 LAB — TSH: TSH: 4.131 u[IU]/mL (ref 0.350–4.500)

## 2018-03-21 MED ORDER — LISINOPRIL 5 MG PO TABS
5.0000 mg | ORAL_TABLET | Freq: Every day | ORAL | Status: DC
  Administered 2018-03-21 – 2018-03-22 (×2): 5 mg via ORAL
  Filled 2018-03-21 (×2): qty 1

## 2018-03-21 MED ORDER — CLOPIDOGREL BISULFATE 75 MG PO TABS
75.0000 mg | ORAL_TABLET | Freq: Every day | ORAL | Status: DC
  Administered 2018-03-21 – 2018-03-22 (×2): 75 mg via ORAL
  Filled 2018-03-21 (×2): qty 1

## 2018-03-21 NOTE — Progress Notes (Signed)
Physical Therapy Treatment Patient Details Name: Clinton Knight MRN: [id number] DOB: 10-25-1960 Today's Date: 03/21/2018    History of Present Illness Pt 56yo male presents to ED with discomfort behind R eye and slurring speech, soon after had sx of L sided weakness and sensory deficit, admitted for CVA on 10/82019. 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 Pt's PMH includes CVA. Per pt not hx of falls.    PT Comments    Pt is progressing toward goals, demonstrates greatly improved activity tolerance. He maintains quiet sitting for >10 minutes without fatiguing and tolerates greater weight shifts. Pt progressed mobility, performing 5 sit-to stands. Sit-to-stand transfers with Mod A, progressed to supervision and CGA to lower down, eccentric control difficulty this date. Pt also demonstrates ability to maintain standing for increased duration with RW before fatiguing, can stand >2 minutes. He ambulates with Mod A, 2+ physical assist and chair follow for safety, increased distance uses RW appropriately without VC, foot clearance on L foot slightly higher than yesterday. PT continues to recommend CIR for d/c. Will continue to progress pt as able.    Follow Up Recommendations  CIR     Equipment Recommendations  Rolling walker with 5" wheels    Recommendations for Other Services       Precautions / Restrictions Precautions Precautions: Fall Restrictions Weight Bearing Restrictions: No    Mobility  Bed Mobility               General bed mobility comments: Not assessed, pt sitting at EOB eating breakfast on arrival.  Transfers Overall transfer level: Needs assistance Equipment used: Rolling walker (2 wheeled) Transfers: Sit to/from Stand Sit to Stand: Mod assist;+2 physical assistance         General transfer comment: Pt  has improved anterior translation, and uses RW for UE support. VC used for initial sit-to-stand sequencing, but not required after as pt demonstrated appropriate sequencing and safety. Intermittent VC used to "stand up tall" for improved posturing. Noted buckling of L knee in stand, tactile cues given, could straighten LE.   Ambulation/Gait Ambulation/Gait assistance: Mod assist;+2 physical assistance Gait Distance (Feet): 10 Feet Assistive device: Rolling walker (2 wheeled) Gait Pattern/deviations: Step-to pattern Gait velocity: decreased   General Gait Details: Pt amb with B dec foot clearance and step length, dec L heel contact. Pt has mild L trunk lean   Stairs             Wheelchair Mobility    Modified Rankin (Stroke Patients Only)       Balance Overall balance assessment: Needs assistance Sitting-balance support: No upper extremity supported;Feet supported Sitting balance-Leahy Scale: Good Sitting balance - Comments: Pt tolerates moderate weight shift in all directions without LOB. In quiet sitting pt has L lateral lean present, but with VC can correct. Can maintain sitting at EOB >10 minutes with single UE support.   Standing balance support: Bilateral upper extremity supported Standing balance-Leahy Scale: Poor Standing balance comment: Pt has increased endurance standing. VC to use posterior chain musculature for posturing. WBing R > L                             Cognition Arousal/Alertness: Awake/alert Behavior During Therapy: WFL for tasks assessed/performed Overall Cognitive Status: Within Functional Limits for tasks assessed  General Comments: A&Ox4      Exercises Other Exercises Other Exercises: Seated AROM ankle DF/PF, marching and LAQs. Other Exercises: Standing marhcing in place 10 reps each side. Noted buckling when in stance on LLE. Tactile cues given, with Min A pt corrects. Other  Exercises: Pt performed reaching outside BOS with LUE multiple directions.    General Comments        Pertinent Vitals/Pain Pain Assessment: No/denies pain Pain Score: 0-No pain    Home Living                      Prior Function            PT Goals (current goals can now be found in the care plan section) Acute Rehab PT Goals Patient Stated Goal: To be independent again. PT Goal Formulation: With patient Time For Goal Achievement: 04/03/18 Potential to Achieve Goals: Fair Progress towards PT goals: Progressing toward goals    Frequency    7X/week      PT Plan Current plan remains appropriate    Co-evaluation              AM-PAC PT "6 Clicks" Daily Activity  Outcome Measure  Difficulty turning over in bed (including adjusting bedclothes, sheets and blankets)?: Unable Difficulty moving from lying on back to sitting on the side of the bed? : Unable Difficulty sitting down on and standing up from a chair with arms (e.g., wheelchair, bedside commode, etc,.)?: Unable Help needed moving to and from a bed to chair (including a wheelchair)?: A Lot Help needed walking in hospital room?: A Lot Help needed climbing 3-5 steps with a railing? : A Lot 6 Click Score: 9    End of Session Equipment Utilized During Treatment: Gait belt Activity Tolerance: Patient tolerated treatment well Patient left: in chair;with call bell/phone within reach;with chair alarm set Nurse Communication: Mobility status PT Visit Diagnosis: Unsteadiness on feet (R26.81);Muscle weakness (generalized) (M62.81);Hemiplegia and hemiparesis;Pain Hemiplegia - Right/Left: Left Hemiplegia - dominant/non-dominant: Non-dominant Hemiplegia - caused by: Cerebral infarction     Time: 2952-8413 PT Time Calculation (min) (ACUTE ONLY): 46 min  Charges:                        Arvilla Meres, SPT 03/21/2018, 1:11 PM

## 2018-03-21 NOTE — Progress Notes (Signed)
Subjective: Patient states that he is feeling much better today with improvement in speech and left-sided weakness.  Patient states that he can now lift his left and curl his fingers. He reports that he still have some difficulty with his gait but he is currently working with physical therapy.  Denies new symptoms.  Objective: Current vital signs: BP (!) 126/113 (BP Location: Right Arm)   Pulse 65   Temp 97.6 F (36.4 C) (Oral)   Resp 18   Ht 5\' 6"  (1.676 m)   Wt 90.6 kg   SpO2 99%   BMI 32.24 kg/m  Vital signs in last 24 hours: Temp:  [97.6 F (36.4 C)-98.6 F (37 C)] 97.6 F (36.4 C) (10/10 0921) Pulse Rate:  [46-65] 65 (10/10 0921) Resp:  [16-18] 18 (10/10 0438) BP: (126-152)/(77-113) 126/113 (10/10 0921) SpO2:  [99 %-100 %] 99 % (10/10 0438)  Intake/Output from previous day: 10/09 0701 - 10/10 0700 In: -  Out: 1400 [Urine:1400] Intake/Output this shift: No intake/output data recorded. Nutritional status:  Diet Order            Diet heart healthy/carb modified Room service appropriate? Yes; Fluid consistency: Thin  Diet effective now              Neurologic Exam:  Mental Status: Alert, oriented, thought content appropriate.   Mild to moderate dysarthria. Able to follow 3 step commands without difficulty. Attention span and concentration seemed appropriate  Cranial Nerves: II: Discs flat bilaterally; Visual fields grossly normal, pupils equal, round, reactive to light and accommodation III,IV, VI: ptosis not present, extra-ocular motions intact bilaterally V,VII:  Mild left facial droop, facial light touch sensation decreased on the left VIII: hearing normal bilaterally IX,X: gag reflex deferred XI: bilateral shoulder shrug XII: midline tongue extension Motor: Right :Upper extremity 5/5Without pronator driftLeft: Upper extremity 4/5 Right:Lower extremity 5/5Left: Lower extremity 4/5 Tone and  bulk:normal tone throughout; no atrophy noted Sensory: Pinprick and light touch decreased on the left Deep Tendon Reflexes: 2+ and symmetric throughout Plantars: Right: DowngoingLeft: Upgoing Cerebellar: Finger-to-nosetesting intact bilaterally.Heel to shin testing normal bilaterally Gait: not tested due to safety concerns  Data Reviewed  Lab Results: Basic Metabolic Panel: Recent Labs  Lab 03/19/18 1921 03/21/18 0425  NA 142 141  K 3.8 4.7  CL 104 108  CO2 27 27  GLUCOSE 104* 104*  BUN 13 21*  CREATININE 1.11 1.20  CALCIUM 9.4 8.9  MG  --  2.1    Liver Function Tests: Recent Labs  Lab 03/19/18 1921  AST 31  ALT 30  ALKPHOS 99  BILITOT 0.5  PROT 7.6  ALBUMIN 3.9   No results for input(s): LIPASE, AMYLASE in the last 168 hours. No results for input(s): AMMONIA in the last 168 hours.  CBC: Recent Labs  Lab 03/19/18 1921  WBC 10.3  NEUTROABS 4.8  HGB 15.0  HCT 46.8  MCV 88.1  PLT 229    Cardiac Enzymes: Recent Labs  Lab 03/19/18 1921  TROPONINI <0.03    Lipid Panel: Recent Labs  Lab 03/20/18 0517  CHOL 156  TRIG 511*  HDL 33*  CHOLHDL 4.7  VLDL UNABLE TO CALCULATE IF TRIGLYCERIDE OVER 400 mg/dL  LDLCALC UNABLE TO CALCULATE IF TRIGLYCERIDE OVER 400 mg/dL    CBG: Recent Labs  Lab 03/19/18 1917  GLUCAP 98    Microbiology: No results found for this or any previous visit.  Coagulation Studies: Recent Labs    03/19/18 1921  LABPROT 11.7  INR  0.87    Imaging: Ct Angio Head W Or Wo Contrast  Result Date: 03/19/2018 CLINICAL DATA:  Weakness, slurred speech and confusion. EXAM: CT ANGIOGRAPHY HEAD AND NECK CT PERFUSION BRAIN TECHNIQUE: Multidetector CT imaging of the head and neck was performed using the standard protocol during bolus administration of intravenous contrast. Multiplanar CT image reconstructions and MIPs were obtained to evaluate the vascular anatomy. Carotid stenosis measurements (when  applicable) are obtained utilizing NASCET criteria, using the distal internal carotid diameter as the denominator. Multiphase CT imaging of the brain was performed following IV bolus contrast injection. Subsequent parametric perfusion maps were calculated using RAPID software. CONTRAST:  ISOVUE-370 IOPAMIDOL (ISOVUE-370) INJECTION 76% COMPARISON:  None. FINDINGS: CTA NECK FINDINGS AORTIC ARCH: There is mild calcific atherosclerosis of the aortic arch. There is no aneurysm, dissection or hemodynamically significant stenosis of the visualized ascending aorta and aortic arch. Conventional 3 vessel aortic branching pattern. The visualized proximal subclavian arteries are widely patent. RIGHT CAROTID SYSTEM: --Common carotid artery: Widely patent origin without common carotid artery dissection or aneurysm. --Internal carotid artery: No dissection, occlusion or aneurysm. Mild atherosclerotic calcification at the carotid bifurcation without hemodynamically significant stenosis. --External carotid artery: No acute abnormality. LEFT CAROTID SYSTEM: --Common carotid artery: Widely patent origin without common carotid artery dissection or aneurysm. --Internal carotid artery:No dissection, occlusion or aneurysm. Mild atherosclerotic calcification at the carotid bifurcation without hemodynamically significant stenosis. --External carotid artery: No acute abnormality. VERTEBRAL ARTERIES: Left dominant configuration. Both origins are normal. There is mild-to-moderate narrowing of both V4 segments. SKELETON: There is no bony spinal canal stenosis. No lytic or blastic lesion. OTHER NECK: Normal pharynx, larynx and major salivary glands. No cervical lymphadenopathy. Unremarkable thyroid gland. UPPER CHEST: No pneumothorax or pleural effusion. No nodules or masses. CTA HEAD FINDINGS ANTERIOR CIRCULATION: --Intracranial internal carotid arteries: Moderate atherosclerotic calcification of both internal carotid arteries at the  skull base. --Anterior cerebral arteries: Normal. Both A1 segments are present. Patent anterior communicating artery. --Middle cerebral arteries: Normal. --Posterior communicating arteries: Absent bilaterally. POSTERIOR CIRCULATION: --Basilar artery: Normal. --Posterior cerebral arteries: Normal. --Superior cerebellar arteries: Normal. --Inferior cerebellar arteries: Normal anterior and posterior inferior cerebellar arteries. VENOUS SINUSES: As permitted by contrast timing, patent. ANATOMIC VARIANTS: None DELAYED PHASE: No parenchymal contrast enhancement. Review of the MIP images confirms the above findings. CT Brain Perfusion Findings: CBF (<30%) Volume: 0mL Perfusion (Tmax>6.0s) volume: 0mL Mismatch Volume: 0 mL Infarction Location:none IMPRESSION: 1. No acute ischemia by CT perfusion criteria. 2. No emergent large vessel occlusion or high-grade stenosis. 3. Moderate atherosclerotic calcification of the internal carotid and vertebral arteries at the skull base. Electronically Signed   By: Deatra Robinson M.D.   On: 03/19/2018 23:08   Ct Angio Neck W And/or Wo Contrast  Result Date: 03/19/2018 CLINICAL DATA:  Weakness, slurred speech and confusion. EXAM: CT ANGIOGRAPHY HEAD AND NECK CT PERFUSION BRAIN TECHNIQUE: Multidetector CT imaging of the head and neck was performed using the standard protocol during bolus administration of intravenous contrast. Multiplanar CT image reconstructions and MIPs were obtained to evaluate the vascular anatomy. Carotid stenosis measurements (when applicable) are obtained utilizing NASCET criteria, using the distal internal carotid diameter as the denominator. Multiphase CT imaging of the brain was performed following IV bolus contrast injection. Subsequent parametric perfusion maps were calculated using RAPID software. CONTRAST:  ISOVUE-370 IOPAMIDOL (ISOVUE-370) INJECTION 76% COMPARISON:  None. FINDINGS: CTA NECK FINDINGS AORTIC ARCH: There is mild calcific atherosclerosis  of the aortic arch. There is no aneurysm, dissection or hemodynamically  significant stenosis of the visualized ascending aorta and aortic arch. Conventional 3 vessel aortic branching pattern. The visualized proximal subclavian arteries are widely patent. RIGHT CAROTID SYSTEM: --Common carotid artery: Widely patent origin without common carotid artery dissection or aneurysm. --Internal carotid artery: No dissection, occlusion or aneurysm. Mild atherosclerotic calcification at the carotid bifurcation without hemodynamically significant stenosis. --External carotid artery: No acute abnormality. LEFT CAROTID SYSTEM: --Common carotid artery: Widely patent origin without common carotid artery dissection or aneurysm. --Internal carotid artery:No dissection, occlusion or aneurysm. Mild atherosclerotic calcification at the carotid bifurcation without hemodynamically significant stenosis. --External carotid artery: No acute abnormality. VERTEBRAL ARTERIES: Left dominant configuration. Both origins are normal. There is mild-to-moderate narrowing of both V4 segments. SKELETON: There is no bony spinal canal stenosis. No lytic or blastic lesion. OTHER NECK: Normal pharynx, larynx and major salivary glands. No cervical lymphadenopathy. Unremarkable thyroid gland. UPPER CHEST: No pneumothorax or pleural effusion. No nodules or masses. CTA HEAD FINDINGS ANTERIOR CIRCULATION: --Intracranial internal carotid arteries: Moderate atherosclerotic calcification of both internal carotid arteries at the skull base. --Anterior cerebral arteries: Normal. Both A1 segments are present. Patent anterior communicating artery. --Middle cerebral arteries: Normal. --Posterior communicating arteries: Absent bilaterally. POSTERIOR CIRCULATION: --Basilar artery: Normal. --Posterior cerebral arteries: Normal. --Superior cerebellar arteries: Normal. --Inferior cerebellar arteries: Normal anterior and posterior inferior cerebellar arteries. VENOUS SINUSES:  As permitted by contrast timing, patent. ANATOMIC VARIANTS: None DELAYED PHASE: No parenchymal contrast enhancement. Review of the MIP images confirms the above findings. CT Brain Perfusion Findings: CBF (<30%) Volume: 0mL Perfusion (Tmax>6.0s) volume: 0mL Mismatch Volume: 0 mL Infarction Location:none IMPRESSION: 1. No acute ischemia by CT perfusion criteria. 2. No emergent large vessel occlusion or high-grade stenosis. 3. Moderate atherosclerotic calcification of the internal carotid and vertebral arteries at the skull base. Electronically Signed   By: Deatra Robinson M.D.   On: 03/19/2018 23:08   Mr Brain Wo Contrast  Result Date: 03/19/2018 CLINICAL DATA:  Headache and left-sided weakness EXAM: MRI HEAD WITHOUT CONTRAST TECHNIQUE: Multiplanar, multiecho pulse sequences of the brain and surrounding structures were obtained without intravenous contrast. COMPARISON:  Head CT from earlier today FINDINGS: Brain: Acute infarct in the right pons, respecting midline. Although signal is weak, there is definite restricted diffusion. Remote lateral lenticulostriate infarcts bilaterally, affecting putamen and deep white matter tracts. Mild chronic small vessel ischemic gliosis in the cerebral white matter. Small remote left cerebellar infarct. No acute hemorrhage, hydrocephalus, or collection. Vascular: Major flow voids are preserved Skull and upper cervical spine: No evidence of marrow lesion Sinuses/Orbits: Negative IMPRESSION: 1. Acute infarct in the right pons. 2. Remote basal ganglia and left cerebellar infarcts. Electronically Signed   By: Marnee Spring M.D.   On: 03/19/2018 21:41   Ct Cerebral Perfusion W Contrast  Result Date: 03/19/2018 CLINICAL DATA:  Weakness, slurred speech and confusion. EXAM: CT ANGIOGRAPHY HEAD AND NECK CT PERFUSION BRAIN TECHNIQUE: Multidetector CT imaging of the head and neck was performed using the standard protocol during bolus administration of intravenous contrast. Multiplanar  CT image reconstructions and MIPs were obtained to evaluate the vascular anatomy. Carotid stenosis measurements (when applicable) are obtained utilizing NASCET criteria, using the distal internal carotid diameter as the denominator. Multiphase CT imaging of the brain was performed following IV bolus contrast injection. Subsequent parametric perfusion maps were calculated using RAPID software. CONTRAST:  ISOVUE-370 IOPAMIDOL (ISOVUE-370) INJECTION 76% COMPARISON:  None. FINDINGS: CTA NECK FINDINGS AORTIC ARCH: There is mild calcific atherosclerosis of the aortic arch. There is no aneurysm, dissection  or hemodynamically significant stenosis of the visualized ascending aorta and aortic arch. Conventional 3 vessel aortic branching pattern. The visualized proximal subclavian arteries are widely patent. RIGHT CAROTID SYSTEM: --Common carotid artery: Widely patent origin without common carotid artery dissection or aneurysm. --Internal carotid artery: No dissection, occlusion or aneurysm. Mild atherosclerotic calcification at the carotid bifurcation without hemodynamically significant stenosis. --External carotid artery: No acute abnormality. LEFT CAROTID SYSTEM: --Common carotid artery: Widely patent origin without common carotid artery dissection or aneurysm. --Internal carotid artery:No dissection, occlusion or aneurysm. Mild atherosclerotic calcification at the carotid bifurcation without hemodynamically significant stenosis. --External carotid artery: No acute abnormality. VERTEBRAL ARTERIES: Left dominant configuration. Both origins are normal. There is mild-to-moderate narrowing of both V4 segments. SKELETON: There is no bony spinal canal stenosis. No lytic or blastic lesion. OTHER NECK: Normal pharynx, larynx and major salivary glands. No cervical lymphadenopathy. Unremarkable thyroid gland. UPPER CHEST: No pneumothorax or pleural effusion. No nodules or masses. CTA HEAD FINDINGS ANTERIOR CIRCULATION:  --Intracranial internal carotid arteries: Moderate atherosclerotic calcification of both internal carotid arteries at the skull base. --Anterior cerebral arteries: Normal. Both A1 segments are present. Patent anterior communicating artery. --Middle cerebral arteries: Normal. --Posterior communicating arteries: Absent bilaterally. POSTERIOR CIRCULATION: --Basilar artery: Normal. --Posterior cerebral arteries: Normal. --Superior cerebellar arteries: Normal. --Inferior cerebellar arteries: Normal anterior and posterior inferior cerebellar arteries. VENOUS SINUSES: As permitted by contrast timing, patent. ANATOMIC VARIANTS: None DELAYED PHASE: No parenchymal contrast enhancement. Review of the MIP images confirms the above findings. CT Brain Perfusion Findings: CBF (<30%) Volume: 0mL Perfusion (Tmax>6.0s) volume: 0mL Mismatch Volume: 0 mL Infarction Location:none IMPRESSION: 1. No acute ischemia by CT perfusion criteria. 2. No emergent large vessel occlusion or high-grade stenosis. 3. Moderate atherosclerotic calcification of the internal carotid and vertebral arteries at the skull base. Electronically Signed   By: Deatra Robinson M.D.   On: 03/19/2018 23:08   Ct Head Code Stroke Wo Contrast  Result Date: 03/19/2018 CLINICAL DATA:  Code stroke. Sudden onset of weakness and slurred speech beginning 1 hour ago EXAM: CT HEAD WITHOUT CONTRAST TECHNIQUE: Contiguous axial images were obtained from the base of the skull through the vertex without intravenous contrast. COMPARISON:  None. FINDINGS: Brain: No evidence of acute infarction, hemorrhage, hydrocephalus, extra-axial collection or mass lesion/mass effect. Remote appearing bilateral basal ganglia infarcts. Small remote left cerebellar infarct. Vascular: Atherosclerotic calcification.  No hyperdense vessel. Skull: Negative Sinuses/Orbits: Negative Other: These results were called by telephone at the time of interpretation on 03/19/2018 at 7:23 pm to Dr. Darnelle Catalan, who  verbally acknowledged these results. ASPECTS [facility] Stroke Program Early CT Score) -when accounting for chronic infarct. - Ganglionic level infarction (caudate, lentiform nuclei, internal capsule, insula, M1-M3 cortex): 7 - Supraganglionic infarction (M4-M6 cortex): 3 Total score (0-10 with 10 being normal): 10 IMPRESSION: 1. No acute finding. 2. Remote bilateral basal ganglia and left cerebellar infarcts. Electronically Signed   By: Marnee Spring M.D.   On: 03/19/2018 19:25    Medications:  I have reviewed the patient's current medications. Prior to Admission:  No medications prior to admission.   Scheduled: . aspirin  81 mg Oral Daily  . atorvastatin  40 mg Oral q1800  . enoxaparin (LOVENOX) injection  40 mg Subcutaneous Q24H  . fenofibrate  160 mg Oral Daily  . magic mouthwash  10 mL Oral TID  . mouth rinse  15 mL Mouth Rinse BID  . nicotine  21 mg Transdermal Daily    Patient seen and examined.  Clinical course and  management discussed.  Necessary edits performed.  I agree with the above.  Assessment and plan of care developed and discussed below.   Assessment: 57 y.o. male with pertinent history of diabetes mellitus, prior strokes, smoking and alcohol use presenting with left sided numbness and weakness, left facial droop, headache, and slurred speech found to have acute right pontine infarct.   Remains stable neurologically with notable improvement in speech and left-sided weakness.  Urine drug screen positive for cocaine likely contributing factor.  Echocardiogram did not show source of emboli.  Plan: 1. Prophylactic therapy- Antiplatelet Aspirin 81 mg/day and Plavix 75 mg /day 2. Statin to keep LDL less than 70 mg/dL 3. PT consult, OT consult, Speech consult 4. Telemetry monitoring 5. Frequent neuro checks 6. Illicit drug abuse counseling  This patient was staffed with Dr. Verlon Au, Thad Ranger who personally evaluated patient, reviewed documentation and agreed with assessment  and plan of care as above.  [name], DNP, FNP-BC Board certified Nurse Practitioner Neurology Department   LOS: 2 days   03/21/2018  10:53 AM      Thana Farr, MD Neurology [phone]  03/21/2018  1:51 PM

## 2018-03-21 NOTE — Progress Notes (Addendum)
Inpatient Rehabilitation-Admissions Coordinator   Spoke with pt and his sister Fernand Parkins over the phone regarding CIR program. Rodney Booze reported the pt would have a discharge plan at her house, with sister Philbert Riser to assist during the day. However, pt stated he has no way to pay the estimated daily cost of care. Per hospital billing notes, Medicaid Referral would not be sent as pt would not qualify for Medicaid programs. AC has communicated with CM, Terrilee Croak, that pt cannot agree to CIR due to potential financial burden.   Please call if questions.   AC will sign off at this time.   Nanine Means, OTR/L  Rehab Admissions Coordinator  [phone] 03/21/2018 12:32 PM

## 2018-03-21 NOTE — Clinical Social Work Note (Signed)
CSW notified by RN CM that patient will not be going to CIR. CSW contacted patient's sister Martha Soltys [phone] regarding disposition. Sister states that patient will be going to CIR and could not go home alone. Sister states that she plans to help him after discharge from CIR. CSW spoke with Tresa Endo from [facility] regarding above. Tresa Endo states that patient refused to pay out of pocket for CIR since he does not have insurance. Tresa Endo states that if patient agrees to pay or set up a payment plan then they can accept him. CSW spoke with patient and explained the above to him. Patient states that he is going to inpatient rehab. CSW explained that he told Tresa Endo that he would not pay for it. Patient states "I cant afford it really but I guess I have to" CSW explained that he does not have to go to CIR if he does not want to go. Patient states that he wants to go to CIR and will be willing to set up a payment plan. CSW contacted Tresa Endo with CIR and notified her that patient is agreeable to CIR and payment plan. CSW notified RNCM of above. CSW signing off. Please re consult if further needs arise.   Ruthe Mannan MSW, [address] [phone]

## 2018-03-21 NOTE — Progress Notes (Signed)
Progress Note  Patient Name: Clinton Knight Date of Encounter: 03/21/2018  Primary Cardiologist: New to [facility] - consult by Gollan  Subjective   No acute overnight events. No chest pain, SOB, palpitations, dizziness, presyncope, or syncope. NSR on telemetry with brief episode of Mobitz type I at 3:31 AM this morning with the longest R-R interval being 3.4 seconds.   Inpatient Medications    Scheduled Meds: . aspirin  81 mg Oral Daily  . atorvastatin  40 mg Oral q1800  . enoxaparin (LOVENOX) injection  40 mg Subcutaneous Q24H  . fenofibrate  160 mg Oral Daily  . magic mouthwash  10 mL Oral TID  . mouth rinse  15 mL Mouth Rinse BID  . nicotine  21 mg Transdermal Daily   Continuous Infusions:  PRN Meds: acetaminophen **OR** acetaminophen (TYLENOL) oral liquid 160 mg/5 mL **OR** acetaminophen, oxyCODONE   Vital Signs    Vitals:   03/20/18 2058 03/21/18 0029 03/21/18 0438 03/21/18 0921  BP: (!) 152/92 (!) 151/88 (!) 142/90 (!) 126/113  Pulse: (!) 55 (!) 50 (!) 46 65  Resp: 17 18 18    Temp: 98 F (36.7 C) 98 F (36.7 C) 97.8 F (36.6 C) 97.6 F (36.4 C)  TempSrc: Oral Oral Oral Oral  SpO2: 100% 99% 99%   Weight:      Height:        Intake/Output Summary (Last 24 hours) at 03/21/2018 1038 Last data filed at 03/21/2018 0031 Gross per 24 hour  Intake -  Output 1050 ml  Net -1050 ml   Filed Weights   03/19/18 1917  Weight: 90.6 kg    Telemetry    NSR with brief episode of Mobitz type I at 3:31 AM with the longest R-R interval being 3.4 seconds - Personally Reviewed  ECG    n/a - Personally Reviewed  Physical Exam   GEN: No acute distress.   Neck: No JVD. Cardiac: RRR, no murmurs, rubs, or gallops.  Respiratory: Clear to auscultation bilaterally.  GI: Soft, nontender, non-distended.   MS: No edema; No deformity. Neuro:  Alert and oriented x 3; Nonfocal.  Psych: Normal affect.  Labs    Chemistry Recent Labs  Lab 03/19/18 1921 03/21/18 0425  NA  142 141  K 3.8 4.7  CL 104 108  CO2 27 27  GLUCOSE 104* 104*  BUN 13 21*  CREATININE 1.11 1.20  CALCIUM 9.4 8.9  PROT 7.6  --   ALBUMIN 3.9  --   AST 31  --   ALT 30  --   ALKPHOS 99  --   BILITOT 0.5  --   GFRNONAA >60 >60  GFRAA >60 >60  ANIONGAP 11 6     Hematology Recent Labs  Lab 03/19/18 1921  WBC 10.3  RBC 5.31  HGB 15.0  HCT 46.8  MCV 88.1  MCH 28.2  MCHC 32.1  RDW 14.1  PLT 229    Cardiac Enzymes Recent Labs  Lab 03/19/18 1921  TROPONINI <0.03   No results for input(s): TROPIPOC in the last 168 hours.   BNPNo results for input(s): BNP, PROBNP in the last 168 hours.   DDimer No results for input(s): DDIMER in the last 168 hours.   Radiology    Ct Angio Head W Or Wo Contrast  Result Date: 03/19/2018 IMPRESSION: 1. No acute ischemia by CT perfusion criteria. 2. No emergent large vessel occlusion or high-grade stenosis. 3. Moderate atherosclerotic calcification of the internal carotid and vertebral  arteries at the skull base. Electronically Signed   By: Deatra Robinson M.D.   On: 03/19/2018 23:08   Ct Angio Neck W And/or Wo Contrast  Result Date: 03/19/2018 IMPRESSION: 1. No acute ischemia by CT perfusion criteria. 2. No emergent large vessel occlusion or high-grade stenosis. 3. Moderate atherosclerotic calcification of the internal carotid and vertebral arteries at the skull base. Electronically Signed   By: Deatra Robinson M.D.   On: 03/19/2018 23:08   Mr Brain Wo Contrast  Result Date: 03/19/2018 IMPRESSION: 1. Acute infarct in the right pons. 2. Remote basal ganglia and left cerebellar infarcts. Electronically Signed   By: Marnee Spring M.D.   On: 03/19/2018 21:41   Ct Cerebral Perfusion W Contrast  Result Date: 03/19/2018 IMPRESSION: 1. No acute ischemia by CT perfusion criteria. 2. No emergent large vessel occlusion or high-grade stenosis. 3. Moderate atherosclerotic calcification of the internal carotid and vertebral arteries at the skull base.  Electronically Signed   By: Deatra Robinson M.D.   On: 03/19/2018 23:08   Ct Head Code Stroke Wo Contrast  Result Date: 03/19/2018 IMPRESSION: 1. No acute finding. 2. Remote bilateral basal ganglia and left cerebellar infarcts. Electronically Signed   By: Marnee Spring M.D.   On: 03/19/2018 19:25    Cardiac Studies   2-D Echo 03/20/2018: Study Conclusions  - Left ventricle: The cavity size was normal. There was moderate   concentric hypertrophy. Systolic function was normal. The   estimated ejection fraction was in the range of 60% to 65%. Wall   motion was normal; there were no regional wall motion   abnormalities. Left ventricular diastolic function parameters   were normal.  Impressions:  - No cardiac source of emboli was indentified.  Patient Profile     57 y.o. male with history of HTN, DM2, alcohol abuse, h/o cocaine use with most recent use 10/6, current tobacco abuse, and CVA who is being seen today for the evaluation of sinus pause in setting of code stroke.  Assessment & Plan    1. Mobitz type I: -Asymptomatic, likely sleeping -TSH normal -Magnesium and potassium at goal -Recommend outpatient sleep study -Avoid rate limiting medications  -Monitor   -No indication for PPM at this time  2. Stroke: -Neurology on board -CT showing moderate atherosclerosis of the carotid and vertebral arteries  -Antiplatelet therapy and statin per neurology  -Echo as above  3. Polysubstance abuse: -Complete cessation is advised  4. Hyperglycemia: -A1c 6.4 -Needs outpatient follow up  5. Hypertriglyceridemia: -Check direct LDL -Fenofibrate  -Statin  6. HTN: -BP well controlled   For questions or updates, please contact [facility] Please consult [email or web address] for contact info under Cardiology/STEMI.    Signed, Eula Listen, PA-C [facility] Pager: [phone] 03/21/2018, 10:38 AM

## 2018-03-21 NOTE — Progress Notes (Signed)
[employer] - [place] at [facility]                                                                                                                                                                                  Patient Demographics   Clinton Knight, is a 57 y.o. male, DOB - 10-Aug-1960, ZOX:[id number]  Admit date - 03/19/2018   Admitting Physician Arnaldo Natal, MD  Outpatient Primary MD for the patient is Patient, No Pcp Per   LOS - 2  Subjective: Pt states his left upper ext and left lower weakness improved  Review of Systems:   CONSTITUTIONAL: No documented fever. No fatigue, weakness. No weight gain, no weight loss.  EYES: No blurry or double vision.  ENT: No tinnitus. No postnasal drip. No redness of the oropharynx.  RESPIRATORY: No cough, no wheeze, no hemoptysis. No dyspnea.  CARDIOVASCULAR: No chest pain. No orthopnea. No palpitations. No syncope.  GASTROINTESTINAL: No nausea, no vomiting or diarrhea. No abdominal pain. No melena or hematochezia.  GENITOURINARY: No dysuria or hematuria.  ENDOCRINE: No polyuria or nocturia. No heat or cold intolerance.  HEMATOLOGY: No anemia. No bruising. No bleeding.  INTEGUMENTARY: No rashes. No lesions.  MUSCULOSKELETAL: No arthritis. No swelling. No gout.  NEUROLOGIC: Left upper extremity left lower extremity weakness PSYCHIATRIC: No anxiety. No insomnia. No ADD.    Vitals:   Vitals:   03/20/18 2058 03/21/18 0029 03/21/18 0438 03/21/18 0921  BP: (!) 152/92 (!) 151/88 (!) 142/90 (!) 126/113  Pulse: (!) 55 (!) 50 (!) 46 65  Resp: 17 18 18    Temp: 98 F (36.7 C) 98 F (36.7 C) 97.8 F (36.6 C) 97.6 F (36.4 C)  TempSrc: Oral Oral Oral Oral  SpO2: 100% 99% 99%   Weight:      Height:        Wt Readings from Last 3 Encounters:  03/19/18 90.6 kg  11/03/17 81.6 kg  04/22/17 83.9 kg     Intake/Output Summary (Last 24 hours) at 03/21/2018 1229 Last data filed at 03/21/2018 0031 Gross per 24 hour  Intake -   Output 1050 ml  Net -1050 ml    Physical Exam:   GENERAL: Pleasant-appearing in no apparent distress.  HEAD, EYES, EARS, NOSE AND THROAT: Atraumatic, normocephalic. Extraocular muscles are intact. Pupils equal and reactive to light. Sclerae anicteric. No conjunctival injection. No oro-pharyngeal erythema.  NECK: Supple. There is no jugular venous distention. No bruits, no lymphadenopathy, no thyromegaly.  HEART: Regular rate and rhythm,. No murmurs, no rubs, no clicks.  LUNGS: Clear to auscultation bilaterally. No rales or rhonchi. No wheezes.  ABDOMEN: Soft, flat, nontender, nondistended. Has good bowel sounds.  No hepatosplenomegaly appreciated.  EXTREMITIES: No evidence of any cyanosis, clubbing, or peripheral edema.  +2 pedal and radial pulses bilaterally.  NEUROLOGIC: The patient is alert, awake, and oriented x3 with left upper extremity and left lower extremity strength is 3 out of 5 SKIN: Moist and warm with no rashes appreciated.  Psych: Not anxious, depressed LN: No inguinal LN enlargement    Antibiotics   Anti-infectives (From admission, onward)   None      Medications   Scheduled Meds: . aspirin  81 mg Oral Daily  . atorvastatin  40 mg Oral q1800  . enoxaparin (LOVENOX) injection  40 mg Subcutaneous Q24H  . fenofibrate  160 mg Oral Daily  . magic mouthwash  10 mL Oral TID  . mouth rinse  15 mL Mouth Rinse BID  . nicotine  21 mg Transdermal Daily   Continuous Infusions: PRN Meds:.acetaminophen **OR** acetaminophen (TYLENOL) oral liquid 160 mg/5 mL **OR** acetaminophen, oxyCODONE   Data Review:   Micro Results No results found for this or any previous visit (from the past 240 hour(s)).  Radiology Reports Ct Angio Head W Or Wo Contrast  Result Date: 03/19/2018 CLINICAL DATA:  Weakness, slurred speech and confusion. EXAM: CT ANGIOGRAPHY HEAD AND NECK CT PERFUSION BRAIN TECHNIQUE: Multidetector CT imaging of the head and neck was performed using the standard  protocol during bolus administration of intravenous contrast. Multiplanar CT image reconstructions and MIPs were obtained to evaluate the vascular anatomy. Carotid stenosis measurements (when applicable) are obtained utilizing NASCET criteria, using the distal internal carotid diameter as the denominator. Multiphase CT imaging of the brain was performed following IV bolus contrast injection. Subsequent parametric perfusion maps were calculated using RAPID software. CONTRAST:  ISOVUE-370 IOPAMIDOL (ISOVUE-370) INJECTION 76% COMPARISON:  None. FINDINGS: CTA NECK FINDINGS AORTIC ARCH: There is mild calcific atherosclerosis of the aortic arch. There is no aneurysm, dissection or hemodynamically significant stenosis of the visualized ascending aorta and aortic arch. Conventional 3 vessel aortic branching pattern. The visualized proximal subclavian arteries are widely patent. RIGHT CAROTID SYSTEM: --Common carotid artery: Widely patent origin without common carotid artery dissection or aneurysm. --Internal carotid artery: No dissection, occlusion or aneurysm. Mild atherosclerotic calcification at the carotid bifurcation without hemodynamically significant stenosis. --External carotid artery: No acute abnormality. LEFT CAROTID SYSTEM: --Common carotid artery: Widely patent origin without common carotid artery dissection or aneurysm. --Internal carotid artery:No dissection, occlusion or aneurysm. Mild atherosclerotic calcification at the carotid bifurcation without hemodynamically significant stenosis. --External carotid artery: No acute abnormality. VERTEBRAL ARTERIES: Left dominant configuration. Both origins are normal. There is mild-to-moderate narrowing of both V4 segments. SKELETON: There is no bony spinal canal stenosis. No lytic or blastic lesion. OTHER NECK: Normal pharynx, larynx and major salivary glands. No cervical lymphadenopathy. Unremarkable thyroid gland. UPPER CHEST: No pneumothorax or pleural  effusion. No nodules or masses. CTA HEAD FINDINGS ANTERIOR CIRCULATION: --Intracranial internal carotid arteries: Moderate atherosclerotic calcification of both internal carotid arteries at the skull base. --Anterior cerebral arteries: Normal. Both A1 segments are present. Patent anterior communicating artery. --Middle cerebral arteries: Normal. --Posterior communicating arteries: Absent bilaterally. POSTERIOR CIRCULATION: --Basilar artery: Normal. --Posterior cerebral arteries: Normal. --Superior cerebellar arteries: Normal. --Inferior cerebellar arteries: Normal anterior and posterior inferior cerebellar arteries. VENOUS SINUSES: As permitted by contrast timing, patent. ANATOMIC VARIANTS: None DELAYED PHASE: No parenchymal contrast enhancement. Review of the MIP images confirms the above findings. CT Brain Perfusion Findings: CBF (<30%) Volume: 0mL Perfusion (Tmax>6.0s) volume: 0mL Mismatch Volume: 0 mL Infarction Location:none IMPRESSION: 1. No  acute ischemia by CT perfusion criteria. 2. No emergent large vessel occlusion or high-grade stenosis. 3. Moderate atherosclerotic calcification of the internal carotid and vertebral arteries at the skull base. Electronically Signed   By: Deatra Robinson M.D.   On: 03/19/2018 23:08   Ct Angio Neck W And/or Wo Contrast  Result Date: 03/19/2018 CLINICAL DATA:  Weakness, slurred speech and confusion. EXAM: CT ANGIOGRAPHY HEAD AND NECK CT PERFUSION BRAIN TECHNIQUE: Multidetector CT imaging of the head and neck was performed using the standard protocol during bolus administration of intravenous contrast. Multiplanar CT image reconstructions and MIPs were obtained to evaluate the vascular anatomy. Carotid stenosis measurements (when applicable) are obtained utilizing NASCET criteria, using the distal internal carotid diameter as the denominator. Multiphase CT imaging of the brain was performed following IV bolus contrast injection. Subsequent parametric perfusion maps were  calculated using RAPID software. CONTRAST:  ISOVUE-370 IOPAMIDOL (ISOVUE-370) INJECTION 76% COMPARISON:  None. FINDINGS: CTA NECK FINDINGS AORTIC ARCH: There is mild calcific atherosclerosis of the aortic arch. There is no aneurysm, dissection or hemodynamically significant stenosis of the visualized ascending aorta and aortic arch. Conventional 3 vessel aortic branching pattern. The visualized proximal subclavian arteries are widely patent. RIGHT CAROTID SYSTEM: --Common carotid artery: Widely patent origin without common carotid artery dissection or aneurysm. --Internal carotid artery: No dissection, occlusion or aneurysm. Mild atherosclerotic calcification at the carotid bifurcation without hemodynamically significant stenosis. --External carotid artery: No acute abnormality. LEFT CAROTID SYSTEM: --Common carotid artery: Widely patent origin without common carotid artery dissection or aneurysm. --Internal carotid artery:No dissection, occlusion or aneurysm. Mild atherosclerotic calcification at the carotid bifurcation without hemodynamically significant stenosis. --External carotid artery: No acute abnormality. VERTEBRAL ARTERIES: Left dominant configuration. Both origins are normal. There is mild-to-moderate narrowing of both V4 segments. SKELETON: There is no bony spinal canal stenosis. No lytic or blastic lesion. OTHER NECK: Normal pharynx, larynx and major salivary glands. No cervical lymphadenopathy. Unremarkable thyroid gland. UPPER CHEST: No pneumothorax or pleural effusion. No nodules or masses. CTA HEAD FINDINGS ANTERIOR CIRCULATION: --Intracranial internal carotid arteries: Moderate atherosclerotic calcification of both internal carotid arteries at the skull base. --Anterior cerebral arteries: Normal. Both A1 segments are present. Patent anterior communicating artery. --Middle cerebral arteries: Normal. --Posterior communicating arteries: Absent bilaterally. POSTERIOR CIRCULATION: --Basilar  artery: Normal. --Posterior cerebral arteries: Normal. --Superior cerebellar arteries: Normal. --Inferior cerebellar arteries: Normal anterior and posterior inferior cerebellar arteries. VENOUS SINUSES: As permitted by contrast timing, patent. ANATOMIC VARIANTS: None DELAYED PHASE: No parenchymal contrast enhancement. Review of the MIP images confirms the above findings. CT Brain Perfusion Findings: CBF (<30%) Volume: 0mL Perfusion (Tmax>6.0s) volume: 0mL Mismatch Volume: 0 mL Infarction Location:none IMPRESSION: 1. No acute ischemia by CT perfusion criteria. 2. No emergent large vessel occlusion or high-grade stenosis. 3. Moderate atherosclerotic calcification of the internal carotid and vertebral arteries at the skull base. Electronically Signed   By: Deatra Robinson M.D.   On: 03/19/2018 23:08   Mr Brain Wo Contrast  Result Date: 03/19/2018 CLINICAL DATA:  Headache and left-sided weakness EXAM: MRI HEAD WITHOUT CONTRAST TECHNIQUE: Multiplanar, multiecho pulse sequences of the brain and surrounding structures were obtained without intravenous contrast. COMPARISON:  Head CT from earlier today FINDINGS: Brain: Acute infarct in the right pons, respecting midline. Although signal is weak, there is definite restricted diffusion. Remote lateral lenticulostriate infarcts bilaterally, affecting putamen and deep white matter tracts. Mild chronic small vessel ischemic gliosis in the cerebral white matter. Small remote left cerebellar infarct. No acute hemorrhage, hydrocephalus, or collection.  Vascular: Major flow voids are preserved Skull and upper cervical spine: No evidence of marrow lesion Sinuses/Orbits: Negative IMPRESSION: 1. Acute infarct in the right pons. 2. Remote basal ganglia and left cerebellar infarcts. Electronically Signed   By: Marnee Spring M.D.   On: 03/19/2018 21:41   Ct Cerebral Perfusion W Contrast  Result Date: 03/19/2018 CLINICAL DATA:  Weakness, slurred speech and confusion. EXAM: CT  ANGIOGRAPHY HEAD AND NECK CT PERFUSION BRAIN TECHNIQUE: Multidetector CT imaging of the head and neck was performed using the standard protocol during bolus administration of intravenous contrast. Multiplanar CT image reconstructions and MIPs were obtained to evaluate the vascular anatomy. Carotid stenosis measurements (when applicable) are obtained utilizing NASCET criteria, using the distal internal carotid diameter as the denominator. Multiphase CT imaging of the brain was performed following IV bolus contrast injection. Subsequent parametric perfusion maps were calculated using RAPID software. CONTRAST:  ISOVUE-370 IOPAMIDOL (ISOVUE-370) INJECTION 76% COMPARISON:  None. FINDINGS: CTA NECK FINDINGS AORTIC ARCH: There is mild calcific atherosclerosis of the aortic arch. There is no aneurysm, dissection or hemodynamically significant stenosis of the visualized ascending aorta and aortic arch. Conventional 3 vessel aortic branching pattern. The visualized proximal subclavian arteries are widely patent. RIGHT CAROTID SYSTEM: --Common carotid artery: Widely patent origin without common carotid artery dissection or aneurysm. --Internal carotid artery: No dissection, occlusion or aneurysm. Mild atherosclerotic calcification at the carotid bifurcation without hemodynamically significant stenosis. --External carotid artery: No acute abnormality. LEFT CAROTID SYSTEM: --Common carotid artery: Widely patent origin without common carotid artery dissection or aneurysm. --Internal carotid artery:No dissection, occlusion or aneurysm. Mild atherosclerotic calcification at the carotid bifurcation without hemodynamically significant stenosis. --External carotid artery: No acute abnormality. VERTEBRAL ARTERIES: Left dominant configuration. Both origins are normal. There is mild-to-moderate narrowing of both V4 segments. SKELETON: There is no bony spinal canal stenosis. No lytic or blastic lesion. OTHER NECK: Normal pharynx,  larynx and major salivary glands. No cervical lymphadenopathy. Unremarkable thyroid gland. UPPER CHEST: No pneumothorax or pleural effusion. No nodules or masses. CTA HEAD FINDINGS ANTERIOR CIRCULATION: --Intracranial internal carotid arteries: Moderate atherosclerotic calcification of both internal carotid arteries at the skull base. --Anterior cerebral arteries: Normal. Both A1 segments are present. Patent anterior communicating artery. --Middle cerebral arteries: Normal. --Posterior communicating arteries: Absent bilaterally. POSTERIOR CIRCULATION: --Basilar artery: Normal. --Posterior cerebral arteries: Normal. --Superior cerebellar arteries: Normal. --Inferior cerebellar arteries: Normal anterior and posterior inferior cerebellar arteries. VENOUS SINUSES: As permitted by contrast timing, patent. ANATOMIC VARIANTS: None DELAYED PHASE: No parenchymal contrast enhancement. Review of the MIP images confirms the above findings. CT Brain Perfusion Findings: CBF (<30%) Volume: 0mL Perfusion (Tmax>6.0s) volume: 0mL Mismatch Volume: 0 mL Infarction Location:none IMPRESSION: 1. No acute ischemia by CT perfusion criteria. 2. No emergent large vessel occlusion or high-grade stenosis. 3. Moderate atherosclerotic calcification of the internal carotid and vertebral arteries at the skull base. Electronically Signed   By: Deatra Robinson M.D.   On: 03/19/2018 23:08   Ct Head Code Stroke Wo Contrast  Result Date: 03/19/2018 CLINICAL DATA:  Code stroke. Sudden onset of weakness and slurred speech beginning 1 hour ago EXAM: CT HEAD WITHOUT CONTRAST TECHNIQUE: Contiguous axial images were obtained from the base of the skull through the vertex without intravenous contrast. COMPARISON:  None. FINDINGS: Brain: No evidence of acute infarction, hemorrhage, hydrocephalus, extra-axial collection or mass lesion/mass effect. Remote appearing bilateral basal ganglia infarcts. Small remote left cerebellar infarct. Vascular: Atherosclerotic  calcification.  No hyperdense vessel. Skull: Negative Sinuses/Orbits: Negative Other: These results were  called by telephone at the time of interpretation on 03/19/2018 at 7:23 pm to Dr. Darnelle Catalan, who verbally acknowledged these results. ASPECTS [facility] Stroke Program Early CT Score) -when accounting for chronic infarct. - Ganglionic level infarction (caudate, lentiform nuclei, internal capsule, insula, M1-M3 cortex): 7 - Supraganglionic infarction (M4-M6 cortex): 3 Total score (0-10 with 10 being normal): 10 IMPRESSION: 1. No acute finding. 2. Remote bilateral basal ganglia and left cerebellar infarcts. Electronically Signed   By: Marnee Spring M.D.   On: 03/19/2018 19:25     CBC Recent Labs  Lab 03/19/18 1921  WBC 10.3  HGB 15.0  HCT 46.8  PLT 229  MCV 88.1  MCH 28.2  MCHC 32.1  RDW 14.1  LYMPHSABS 4.4*  MONOABS 0.8  EOSABS 0.3  BASOSABS 0.1    Chemistries  Recent Labs  Lab 03/19/18 1921 03/21/18 0425  NA 142 141  K 3.8 4.7  CL 104 108  CO2 27 27  GLUCOSE 104* 104*  BUN 13 21*  CREATININE 1.11 1.20  CALCIUM 9.4 8.9  MG  --  2.1  AST 31  --   ALT 30  --   ALKPHOS 99  --   BILITOT 0.5  --    ------------------------------------------------------------------------------------------------------------------ estimated creatinine clearance is 72.4 mL/min (by C-G formula based on SCr of 1.2 mg/dL). ------------------------------------------------------------------------------------------------------------------ Recent Labs    03/20/18 0517  HGBA1C 6.4*   ------------------------------------------------------------------------------------------------------------------ Recent Labs    03/20/18 0517  CHOL 156  HDL 33*  LDLCALC UNABLE TO CALCULATE IF TRIGLYCERIDE OVER 400 mg/dL  TRIG 914*  CHOLHDL 4.7   ------------------------------------------------------------------------------------------------------------------ Recent Labs    03/21/18 0425  TSH 4.131    ------------------------------------------------------------------------------------------------------------------ No results for input(s): VITAMINB12, FOLATE, FERRITIN, TIBC, IRON, RETICCTPCT in the last 72 hours.  Coagulation profile Recent Labs  Lab 03/19/18 1921  INR 0.87    No results for input(s): DDIMER in the last 72 hours.  Cardiac Enzymes Recent Labs  Lab 03/19/18 1921  TROPONINI <0.03   ------------------------------------------------------------------------------------------------------------------ Invalid input(s): POCBNP    Assessment & Plan   Patient is a 57 year old with an acute CVA  1. Acute cva  continue aspirin PT recommends CIAR Is passed a swallow eval Echo showes no clot Continue lipitor  2.  Hypertriglyceridemia continue fenofibrate and Lipitor   3. Nicotine abuse smoking cessation provided nicotine patch  4. htn-we will need blood pressure lowering medication likely lisinopril  5.  Sinus pauses with bradycardia appreciate cardiolgy input     Code Status Orders  (From admission, onward)         Start     Ordered   03/20/18 0005  Full code  Continuous     03/20/18 0004        Code Status History    This patient has a current code status but no historical code status.           Consults  Cards, and neurology   DVT Prophylaxis  Lovenox  Lab Results  Component Value Date   PLT 229 03/19/2018     Time Spent in minutes   35-minute greater than 50% of time spent in care coordination and counseling patient regarding the condition and plan of care.   Auburn Bilberry M.D on 03/21/2018 at 12:29 PM  Between 7am to 6pm - Pager - [phone]  After 6pm go to [email or web address] - Social research officer, government  [employer]   Office  [phone]

## 2018-03-21 NOTE — PMR Pre-admission (Signed)
[facility] PMR Admission Coordinator Pre-Admission Assessment  Patient: Clinton Knight is an 57 y.o., male MRN: [id number] DOB: [birth year] Height: '5\' 6"'  (167.6 cm) Weight: 90.6 kg  Insurance Information HMO:     PPO:      PCP:      IPA:      80/20:      OTHER:  PRIMARY: Pt is uninsured (Self Pay)      Policy#:       Subscriber:  CM Name:       Phone#:      Fax#:  Pre-Cert#:       Employer:  Benefits:  Phone #:      Name:  Eff. Date:      Deduct:       Out of Pocket Max:       Life Max:  CIR:  Pt and family understand the estimated daily cost of care and has been provided with financial counselor information.     SNF:  Outpatient:      Co-Pay:  Home Health:       Co-Pay:  DME:      Co-Pay:  Providers:  SECONDARY:       Policy#:       Subscriber:  CM Name:       Phone#:      Fax#:  Pre-Cert#:       Employer:  Benefits:  Phone #:      Name:  Eff. 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  History of Present Illness: Pt is a 56 yo M with history of HTN, DM2, alcohol abuse, history of cocaine use with most recent use 10/6, current tobacco abuse. He presented to [facility] with sudden onset of discomfort behind right eye and slurred speech as well as left sided weakness and sensory deficit.  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 Pt was seen by cardiology for evaluation of sinus pause in setting of code stroke. There was no evidence of high-grade AV block on baseline EKG with recommendations for sleep apnea screening. Pt continues to have left sided weakness and gait disturbance. PT and OT recommendations are for CIR. Pt is to be admitted to CIR on 03/22/18.   Patient's medical record from [facility] has been reviewed by the rehabilitation admission coordinator and physician.     Past Medical History  Past Medical History:  Diagnosis Date  . Acute kidney insufficiency    03/2018: Cr 1.1 in setting of stroke (baseline ~0.9)  . Alcohol abuse   . Cocaine abuse ([place])    03/2018: Postitive cocaine  UA tox screen  . Diabetes ([place])    03/20/18: A1C 6.4  . Hypertension   . Hypertriglyceridemia    03/20/18 TG 511  . Nicotine abuse   . Stroke [facility])     Family History   family history includes Diabetes in his mother; Heart attack (age of onset: 73) in his brother; Hypertension in his mother; Stroke in his mother; Thyroid disease in his daughter.  Prior Rehab/Hospitalizations Has the patient had major surgery during 100 days prior to admission? No    Current Medications  Current Facility-Administered Medications:  .  acetaminophen (TYLENOL) tablet 650 mg, 650 mg, Oral, Q4H PRN, 650 mg at 03/20/18 1002 **OR** acetaminophen (TYLENOL) solution 650 mg, 650 mg, Per Tube, Q4H PRN **OR** acetaminophen (TYLENOL) suppository 650 mg, 650 mg, Rectal, Q4H PRN, Lance Coon, MD .  aspirin chewable tablet 81 mg, 81 mg, Oral, Daily, Dustin Flock, MD, 81 mg at 03/21/18 0923 .  atorvastatin (LIPITOR) tablet 40 mg, 40 mg, Oral, q1800, Dustin Flock, MD, 40 mg at 03/21/18 1718 .  clopidogrel (PLAVIX) tablet 75 mg, 75 mg, Oral, Daily, Dustin Flock, MD, 75 mg at 03/21/18 1718 .  enoxaparin (LOVENOX) injection 40 mg, 40 mg, Subcutaneous, Q24H, Lance Coon, MD, 40 mg at 03/21/18 2031 .  fenofibrate tablet  160 mg, 160 mg, Oral, Daily, Dustin Flock, MD, 160 mg at 03/21/18 0923 .  lisinopril (PRINIVIL,ZESTRIL) tablet 5 mg, 5 mg, Oral, Daily, Dustin Flock, MD, 5 mg at 03/21/18 2031 .  magic mouthwash, 10 mL, Oral, TID, Dustin Flock, MD, 10 mL at 03/21/18 2031 .  MEDLINE mouth rinse, 15 mL, Mouth Rinse, BID, Lance Coon, MD, 15 mL at 03/21/18 0923 .  nicotine (NICODERM CQ - dosed in mg/24 hours) patch 21 mg, 21 mg, Transdermal, Daily, Dustin Flock, MD, 21 mg at 03/21/18 0923 .  oxyCODONE (Oxy IR/ROXICODONE) immediate release tablet 5 mg, 5 mg, Oral, Q6H PRN, Lance Coon, MD  Patients Current Diet:   Diet Order            Diet heart healthy/carb modified Room service appropriate? Yes; Fluid consistency: Thin  Diet effective now              Precautions / Restrictions Precautions Precautions: Fall Restrictions Weight Bearing Restrictions: No   Has the patient had 2 or more falls or a fall with injury in the past year?Yes  Prior Activity Level    Prior Functional Level Do you want Prior Function Level of Independence: Independent Comments: Per pt he was independent with all mobility, ADL, and IADL. Eager and actively looking for work but not currently employed.  from other? Self Care: Did the patient need help bathing, dressing, using the toilet or eating?  Independent  Indoor Mobility: Did the patient need assistance with walking from room to room (with or without device)? Independent  Stairs: Did the patient need assistance with internal or external stairs (with or without device)? Independent  Functional Cognition: Did the patient need help planning regular tasks such as shopping or remembering to take medications? Independent  Home Assistive Devices / Equipment Home Assistive Devices/Equipment: None  Prior Device Use: Indicate devices/aids used by the patient prior to current illness, exacerbation or injury? None of the above  Prior Functional Level Comments:  Per pt he was independent with all mobility, ADL, and IADL. Eager and actively looking for work but not currently employed.    Prior Functional Level Current Functional Level  Bed Mobility Independent Min A  Transfers  Independent  Mod A x2  Mobility - Walk/Wheelchair Independent Mod A x2  Mobility - Ambulation/Gait Independent Mod assist, +2 physical assistance  Upper Body Dressing Independent Sitting, Minimal assistance  Lower Body Dressing Independent Sit to/from stand, Moderate assistance, Minimal assistance  Grooming Independent Sitting, Supervision/safety, Oral care, Set up  Eating/Drinking Independent Sitting, Set up, Minimal assistance  Toilet Transfer Independent BSC, Ambulation, RW, Moderate assistance, +2 for physical assistance  Bladder Continence continent  continent  Bowel Management  continent  continent  Stair Climbing independent  unable  Communication WNL Slightly slurred  Memory WNL  appears intact  Cooking/Meal Prep  Independent      Housework  Independent   Money Management  Independent   Driving        Special needs/care consideration BiPAP/CPAP:No CPM: no Continuous Drip IV: no Dialysis: no        Days: no Life Vest:No Oxygen: no Special Bed: no Trach Size: no Wound Vac (area): no      Location: no Skin: abrasion to right upper back                          Bowel mgmt:last BM: 03/20/18 Bladder mgmt:continent; use of urinal.  Diabetic mgmt: glucose being monitored in acute  Previous Home Environment Home Care Services: No Additional Comments: At this date pt states they are between places and do not know where they will go at dc.  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 Discharge Plan Discussed with Primary Caregiver: Yes(with Rocco Serene and Juluis Rainier ) Is Caregiver In Agreement with Plan?: Yes Does Caregiver/Family have Issues with Lodging/Transportation while Pt is in Rehab?: No  Goals/Additional Needs Patient/Family Goal for Rehab: PT/OT: Min A; SLP: Mod I Expected length of stay: 13-16 days Cultural Considerations: [place] Dietary Needs: heart healthy, thin liquids Equipment Needs: TBD Pt/Family Agrees to Admission and willing to participate: Yes Program Orientation Provided & Reviewed with Pt/Caregiver Including Roles  & Responsibilities: Yes(pt, and two sisters)  Barriers to Discharge: Lack of/limited family support, Other (comments)(pt reports intermittant homelessness; sister agreed to house)  Patient Condition: I have reviewed medical records from [place], spoken with Ross and CM Hassan Rowan, and patient and his two sisters. I met with patient at the bedside as well as discussed via phone to gather all the information required for inpatient rehabilitation assessment.  Patient will benefit from ongoing PT, OT, and SLP, can actively participate in 3 hours of therapy a day 5 days of the week, and can make measurable gains during the admission.  Patient will also  benefit from the coordinated team approach during an Inpatient Acute Rehabilitation admission.  The patient will receive intensive therapy as  well as Rehabilitation physician, nursing, social worker, and care management interventions.  Due to safety, skin/wound care, disease management, medical administration, pain management, patient education the patient requires 24 hour a day rehabilitation nursing.  The patient is currently Mod A x2 for mobility and Min/Mod A for basic ADLs.  Discharge setting and therapy post discharge at home with home health and some assistance from family is anticipated.  Patient has agreed to participate in the Acute Inpatient Rehabilitation Program and will admit today 03/22/18.  Preadmission Screen Completed By:  Jhonnie Garner, 03/22/2018 8:16 AM ______________________________________________________________________   Discussed status with Dr. Naaman Plummer on 03/21/18 at 4:00PM and received telephone approval for admission today, 03/22/18.   Admission Coordinator:  Jhonnie Garner, time 8:15AM/Date 03/22/18   Assessment/Plan: Diagnosis: right pontine infarct 1. Does the need for close, 24 hr/day  Medical supervision in concert with the patient's rehab needs make it unreasonable for this patient to be served in a less intensive setting? Yes 2. Co-Morbidities requiring supervision/potential complications: AKI, DM, drug abuse hx, post-stroke sequelae 3. Due to bladder management, bowel management, safety, skin/wound care, disease management, medication administration, pain management and patient education, does the patient require 24 hr/day rehab nursing? Yes 4. Does the patient require coordinated care of a physician, rehab nurse, PT (1-2 hrs/day, 5 days/week), OT (1-2 hrs/day, 5 days/week) and SLP (1-2 hrs/day, 5 days/week) to address physical and functional deficits in the context of the above medical diagnosis(es)? Yes Addressing deficits in the following areas: balance, endurance,  locomotion, strength, transferring, bowel/bladder control, bathing, dressing, feeding, grooming, toileting, cognition, speech, swallowing and psychosocial support 5. Can the patient actively participate in an intensive therapy program of at least 3 hrs of therapy 5 days a week? Yes 6. The potential for patient to make measurable gains while on inpatient rehab is excellent 7. Anticipated functional outcomes upon discharge from inpatients are: min assist PT, min assist OT, modified independent SLP 8. Estimated rehab length of stay to reach the above functional goals is: 13-16 days 9. Anticipated D/C setting: Home 10. Anticipated post D/C treatments: [place] therapy 11. Overall Rehab/Functional Prognosis: excellent    RECOMMENDATIONS: This patient's condition is appropriate for continued rehabilitative care in the following setting: CIR Patient has agreed to participate in recommended program. Yes Note that insurance prior authorization may be required for reimbursement for recommended care.  Comment:Admit to inpatient rehab today  Meredith Staggers, MD, [facility] 03/22/2018   Jhonnie Garner 03/22/2018

## 2018-03-21 NOTE — Progress Notes (Addendum)
Rehab Admissions Coordinator Note:  Per PT and OT recommendation,the patient was screened by Nanine Means for appropriateness for an Inpatient Acute Rehab Consult.  At this time, it appears pt is appropriate for CIR, pending family support. AC has placed IP Rehab Consult Order and will follow along to determine further candidacy and clarify discharge plans. AC will contact pt to see if he is interested in the program. Please call if questions.  Nanine Means 03/21/2018, 8:31 AM  I can be reached at [phone].

## 2018-03-21 NOTE — Progress Notes (Signed)
Inpatient Rehabilitation-Admissions Coordinator   [facility] was contacted by CSW that pt and family agree to [facility] and will set up payment plans. AC called pt to confirm his intention for CIR and get confirmation that he understands the estimated cost; pt able to verbalize back the daily cost and understands his estimated length of stay. AC discussed that pt will most likely need 24/7 A at DC from CIR and both pt and his sister will need to agree to support. Per Fernand Parkins, pt will return home with her after short term rehab and between her and her 6 siblings, he will have 24/7 Assist at home. AC spoke with 1 sister, Philbert Riser to confirm her assistance as well.     AC plans to meet with pt and his family tomorrow at 7AM to firm up DC plans and review brochures and paperwork for CIR for possible admit tomorrow, 03/22/18.   AC will follow up once family meeting complete.   Nanine Means, OTR/L  Rehab Admissions Coordinator  [phone] 03/21/2018 5:06 PM

## 2018-03-21 NOTE — Care Management (Addendum)
  Left message with Fae Pippin at [facility] [phone]) to discuss admitted this patient to their services. Received telephone call back from [facility].  States that Nanine Means will be reviewing Mr Geesey for admission into their facility. Dr. Auburn Bilberry states that Mr. Heilman is ready for discharge. Gwenette Greet RN MSN CCM Care Management [phone]

## 2018-03-22 ENCOUNTER — Encounter (HOSPITAL_COMMUNITY): Payer: Self-pay | Admitting: Nurse Practitioner

## 2018-03-22 ENCOUNTER — Inpatient Hospital Stay (HOSPITAL_COMMUNITY)
Admission: RE | Admit: 2018-03-22 | Discharge: 2018-03-29 | DRG: 057 | Disposition: A | Discharge status: Home / Self Care | Payer: Self-pay | Source: Intra-hospital | Attending: Physical Medicine & Rehabilitation | Admitting: Physical Medicine & Rehabilitation

## 2018-03-22 ENCOUNTER — Other Ambulatory Visit: Payer: Self-pay

## 2018-03-22 DIAGNOSIS — E785 Hyperlipidemia, unspecified: Secondary | ICD-10-CM | POA: Diagnosis present

## 2018-03-22 DIAGNOSIS — Z79899 Other long term (current) drug therapy: Secondary | ICD-10-CM

## 2018-03-22 DIAGNOSIS — I10 Essential (primary) hypertension: Secondary | ICD-10-CM

## 2018-03-22 DIAGNOSIS — Z6831 Body mass index (BMI) 31.0-31.9, adult: Secondary | ICD-10-CM

## 2018-03-22 DIAGNOSIS — E781 Pure hyperglyceridemia: Secondary | ICD-10-CM | POA: Diagnosis present

## 2018-03-22 DIAGNOSIS — F17210 Nicotine dependence, cigarettes, uncomplicated: Secondary | ICD-10-CM | POA: Diagnosis present

## 2018-03-22 DIAGNOSIS — Z8249 Family history of ischemic heart disease and other diseases of the circulatory system: Secondary | ICD-10-CM

## 2018-03-22 DIAGNOSIS — K5901 Slow transit constipation: Secondary | ICD-10-CM

## 2018-03-22 DIAGNOSIS — D72829 Elevated white blood cell count, unspecified: Secondary | ICD-10-CM

## 2018-03-22 DIAGNOSIS — K5900 Constipation, unspecified: Secondary | ICD-10-CM | POA: Diagnosis present

## 2018-03-22 DIAGNOSIS — Z833 Family history of diabetes mellitus: Secondary | ICD-10-CM

## 2018-03-22 DIAGNOSIS — G479 Sleep disorder, unspecified: Secondary | ICD-10-CM

## 2018-03-22 DIAGNOSIS — Z23 Encounter for immunization: Secondary | ICD-10-CM

## 2018-03-22 DIAGNOSIS — I441 Atrioventricular block, second degree: Secondary | ICD-10-CM | POA: Diagnosis present

## 2018-03-22 DIAGNOSIS — I6350 Cerebral infarction due to unspecified occlusion or stenosis of unspecified cerebral artery: Secondary | ICD-10-CM | POA: Diagnosis present

## 2018-03-22 DIAGNOSIS — I69322 Dysarthria following cerebral infarction: Secondary | ICD-10-CM

## 2018-03-22 DIAGNOSIS — I129 Hypertensive chronic kidney disease with stage 1 through stage 4 chronic kidney disease, or unspecified chronic kidney disease: Secondary | ICD-10-CM | POA: Diagnosis present

## 2018-03-22 DIAGNOSIS — I69354 Hemiplegia and hemiparesis following cerebral infarction affecting left non-dominant side: Principal | ICD-10-CM

## 2018-03-22 DIAGNOSIS — E1122 Type 2 diabetes mellitus with diabetic chronic kidney disease: Secondary | ICD-10-CM | POA: Diagnosis present

## 2018-03-22 DIAGNOSIS — Z823 Family history of stroke: Secondary | ICD-10-CM

## 2018-03-22 DIAGNOSIS — N182 Chronic kidney disease, stage 2 (mild): Secondary | ICD-10-CM

## 2018-03-22 DIAGNOSIS — E1169 Type 2 diabetes mellitus with other specified complication: Secondary | ICD-10-CM

## 2018-03-22 DIAGNOSIS — I69392 Facial weakness following cerebral infarction: Secondary | ICD-10-CM

## 2018-03-22 DIAGNOSIS — E669 Obesity, unspecified: Secondary | ICD-10-CM | POA: Diagnosis present

## 2018-03-22 LAB — CBC
HCT: 51.8 % (ref 39.0–52.0)
HEMOGLOBIN: 15.6 g/dL (ref 13.0–17.0)
MCH: 27.4 pg (ref 26.0–34.0)
MCHC: 30.1 g/dL (ref 30.0–36.0)
MCV: 90.9 fL (ref 80.0–100.0)
NRBC: 0 % (ref 0.0–0.2)
PLATELETS: 265 10*3/uL (ref 150–400)
RBC: 5.7 MIL/uL (ref 4.22–5.81)
RDW: 13.8 % (ref 11.5–15.5)
WBC: 11 10*3/uL — AB (ref 4.0–10.5)

## 2018-03-22 LAB — CREATININE, SERUM: CREATININE: 1.28 mg/dL — AB (ref 0.61–1.24)

## 2018-03-22 LAB — LDL CHOLESTEROL, DIRECT: Direct LDL: 113 mg/dL — ABNORMAL HIGH (ref 0–99)

## 2018-03-22 MED ORDER — ACETAMINOPHEN 325 MG PO TABS
650.0000 mg | ORAL_TABLET | ORAL | Status: DC | PRN
  Administered 2018-03-24: 650 mg via ORAL
  Filled 2018-03-22: qty 2

## 2018-03-22 MED ORDER — CLOPIDOGREL BISULFATE 75 MG PO TABS: 75.0000 mg | ORAL_TABLET | Freq: Every day | ORAL | Status: DC

## 2018-03-22 MED ORDER — ASPIRIN 81 MG PO CHEW
81.0000 mg | CHEWABLE_TABLET | Freq: Every day | ORAL | Status: DC
  Administered 2018-03-23 – 2018-03-29 (×7): 81 mg via ORAL
  Filled 2018-03-22 (×8): qty 1

## 2018-03-22 MED ORDER — ATORVASTATIN CALCIUM 40 MG PO TABS: 40.0000 mg | ORAL_TABLET | Freq: Every day | ORAL | Status: DC

## 2018-03-22 MED ORDER — INFLUENZA VAC SPLIT QUAD 0.5 ML IM SUSY
0.5000 mL | PREFILLED_SYRINGE | INTRAMUSCULAR | Status: AC
  Administered 2018-03-23: 0.5 mL via INTRAMUSCULAR
  Filled 2018-03-22: qty 0.5

## 2018-03-22 MED ORDER — ACETAMINOPHEN 650 MG RE SUPP: 650.0000 mg | RECTAL | Status: DC | PRN

## 2018-03-22 MED ORDER — CLOPIDOGREL BISULFATE 75 MG PO TABS
75.0000 mg | ORAL_TABLET | Freq: Every day | ORAL | Status: DC
  Administered 2018-03-23 – 2018-03-29 (×7): 75 mg via ORAL
  Filled 2018-03-22 (×8): qty 1

## 2018-03-22 MED ORDER — ONDANSETRON HCL 4 MG/2ML IJ SOLN: 4.0000 mg | Freq: Four times a day (QID) | INTRAMUSCULAR | Status: DC | PRN

## 2018-03-22 MED ORDER — ENOXAPARIN SODIUM 40 MG/0.4ML ~~LOC~~ SOLN
40.0000 mg | SUBCUTANEOUS | Status: DC
  Administered 2018-03-23 – 2018-03-28 (×6): 40 mg via SUBCUTANEOUS
  Filled 2018-03-22 (×6): qty 0.4

## 2018-03-22 MED ORDER — LISINOPRIL 5 MG PO TABS
5.0000 mg | ORAL_TABLET | Freq: Every day | ORAL | Status: DC
  Administered 2018-03-23 – 2018-03-29 (×7): 5 mg via ORAL
  Filled 2018-03-22 (×8): qty 1

## 2018-03-22 MED ORDER — ENOXAPARIN SODIUM 40 MG/0.4ML ~~LOC~~ SOLN
40.0000 mg | SUBCUTANEOUS | Status: DC
  Filled 2018-03-22: qty 0.4

## 2018-03-22 MED ORDER — ASPIRIN 81 MG PO CHEW: 81.0000 mg | CHEWABLE_TABLET | Freq: Every day | ORAL | Status: DC

## 2018-03-22 MED ORDER — FENOFIBRATE 160 MG PO TABS: 160.0000 mg | ORAL_TABLET | Freq: Every day | ORAL | Status: DC

## 2018-03-22 MED ORDER — FENOFIBRATE 160 MG PO TABS
160.0000 mg | ORAL_TABLET | Freq: Every day | ORAL | Status: DC
  Administered 2018-03-23 – 2018-03-29 (×7): 160 mg via ORAL
  Filled 2018-03-22 (×9): qty 1

## 2018-03-22 MED ORDER — PNEUMOCOCCAL VAC POLYVALENT 25 MCG/0.5ML IJ INJ
0.5000 mL | INJECTION | INTRAMUSCULAR | Status: AC
  Administered 2018-03-23: 0.5 mL via INTRAMUSCULAR
  Filled 2018-03-22: qty 0.5

## 2018-03-22 MED ORDER — ONDANSETRON HCL 4 MG PO TABS: 4.0000 mg | ORAL_TABLET | Freq: Four times a day (QID) | ORAL | Status: DC | PRN

## 2018-03-22 MED ORDER — ACETAMINOPHEN 160 MG/5ML PO SOLN: 650.0000 mg | ORAL | Status: DC | PRN

## 2018-03-22 MED ORDER — SORBITOL 70 % SOLN
30.0000 mL | Freq: Every day | Status: DC | PRN
  Administered 2018-03-22: 30 mL via ORAL
  Filled 2018-03-22: qty 30

## 2018-03-22 MED ORDER — NICOTINE 21 MG/24HR TD PT24: 21.0000 mg | MEDICATED_PATCH | Freq: Every day | TRANSDERMAL | 0 refills | Status: DC

## 2018-03-22 MED ORDER — NICOTINE 21 MG/24HR TD PT24
21.0000 mg | MEDICATED_PATCH | Freq: Every day | TRANSDERMAL | Status: DC
  Administered 2018-03-23 – 2018-03-27 (×5): 21 mg via TRANSDERMAL
  Filled 2018-03-22 (×6): qty 1

## 2018-03-22 MED ORDER — ATORVASTATIN CALCIUM 40 MG PO TABS
40.0000 mg | ORAL_TABLET | Freq: Every day | ORAL | Status: DC
  Administered 2018-03-22 – 2018-03-28 (×7): 40 mg via ORAL
  Filled 2018-03-22 (×7): qty 1

## 2018-03-22 MED ORDER — ENOXAPARIN SODIUM 40 MG/0.4ML ~~LOC~~ SOLN: 40.0000 mg | SUBCUTANEOUS | Status: DC

## 2018-03-22 MED ORDER — LISINOPRIL 5 MG PO TABS: 5.0000 mg | ORAL_TABLET | Freq: Every day | ORAL | Status: DC

## 2018-03-22 NOTE — Progress Notes (Signed)
Patient ID: Clinton Knight, male   DOB: [birth year], 57 y.o.   MRN: [id number] Patient admitted to [phone] via stretcher, escorted by [facility] staff.  Patient oriented to unit, including fall prevention safety plan, personal belongings policy, and visitation policy.  Patient appears to be in no immediate distress at this time.  Removed IV per Deatra Ina, PA request as he no longer needs it.  Demonstrated proper use of call bell, placed within reach.  Dani Gobble, RN

## 2018-03-22 NOTE — H&P (Signed)
Physical Medicine and Rehabilitation Admission H&P    Chief Complaint  Patient presents with  . Code Stroke  : HPI: Clinton Knight is a 57 year old right-handed male with history of chronic renal insufficiency with creatinine 1.1, alcohol/tobacco and cocaine abuse, diabetes mellitus, hypertension.  Per chart review patient independent prior to admission.  Plan is to discharge home with sister and multiple family members that can provide assistance as needed.  Patient on no prescription medications at time of admission.  Presented 03/19/2018 to [facility] with left side weakness, numbness and slurred speech.  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  Urine drug screen positive cocaine.  Troponin negative.  MRI showed acute infarct in the right pons.  T angiogram of head and neck with no acute ischemia, no emergent large vessel occlusion.  Echocardiogram with ejection fraction of 65% no wall motion abnormalities.  Cardiology consulted for evaluation of sinus pauses noted on telemetry.  No evidence of high-grade AV block noted.  Advised to avoid any medications that could cause bradycardia no further work-up was indicated.  Maintained on aspirin and Plavix for CVA prophylaxis.  Subcutaneous Lovenox for DVT prophylaxis.  Tolerating a regular diet.  Therapy evaluations completed with recommendations of physical medicine rehab consult.  Patient was admitted for a comprehensive rehab program.  Review of Systems  Constitutional: Negative for chills and fever.  HENT: Negative for hearing loss.   Eyes: Negative for blurred vision and double vision.  Respiratory: Positive for cough. Negative for shortness of breath.   Cardiovascular: Negative for chest pain, palpitations and leg swelling.  Gastrointestinal: Positive for constipation. Negative for nausea and vomiting.  Genitourinary: Negative for dysuria, flank  pain and hematuria.  Skin: Negative for rash.  Neurological: Positive for speech change and focal weakness.  All other systems reviewed and are negative.  Past Medical History:  Diagnosis Date  . Acute kidney insufficiency    03/2018: Cr 1.1 in setting of stroke (baseline ~0.9)  . Alcohol abuse   . Cocaine abuse ([place])    03/2018: Postitive cocaine UA tox screen  . Diabetes ([place])    03/20/18: A1C 6.4  . Hypertension   . Hypertriglyceridemia    03/20/18 TG 511  . Nicotine abuse   . Stroke [facility])    Past Surgical History:  Procedure Laterality Date  . NO PAST SURGERIES     Family History  Problem Relation Age of Onset  . Diabetes Mother   . Stroke Mother   . Hypertension Mother   . Heart attack Brother 58       Brother, MI, 59 yo  . Thyroid disease Daughter        Removed her entire thyroid   Social History:  reports that he has been smoking cigarettes. He has been smoking about 1.00 pack per day. He has never used smokeless tobacco. He reports that he drinks alcohol. He reports that he has current or past drug history. Drug: Cocaine. Allergies: No Known Allergies No medications prior to admission.    Drug Regimen Review Drug regimen was reviewed and remains appropriate with no significant issues identified  Home: Home Living Family/patient expects to be discharged to:: Unsure Additional Comments: At this date pt states they are between places and do not know where they will go at dc.   Functional History: Prior Function Level of Independence: Independent Comments: Per pt he was independent with all mobility,  ADL, and IADL. Eager and actively looking for work but not currently employed.   Functional Status:  Mobility: Bed Mobility Overal bed mobility: Needs Assistance Bed Mobility: Sit to Supine Supine to sit: Min assist Sit to supine: Min assist General bed mobility comments: Not assessed, pt sitting at EOB eating breakfast on arrival. Transfers Overall  transfer level: Needs assistance Equipment used: Rolling walker (2 wheeled) Transfers: Sit to/from Stand Sit to Stand: Mod assist, +2 physical assistance General transfer comment: Pt has improved anterior translation, and uses RW for UE support. VC used for initial sit-to-stand sequencing, but not required after as pt demonstrated appropriate sequencing and safety. Intermittent VC used to "stand up tall" for improved posturing. Noted buckling of L knee in stand, tactile cues given, could straighten LE.  Ambulation/Gait Ambulation/Gait assistance: Mod assist, +2 physical assistance Gait Distance (Feet): 10 Feet Assistive device: Rolling walker (2 wheeled) Gait Pattern/deviations: Step-to pattern General Gait Details: Pt amb with B dec foot clearance and step length, dec L heel contact. Pt has mild L trunk lean Gait velocity: decreased    ADL: ADL Overall ADL's : Needs assistance/impaired Eating/Feeding: Sitting, Set up, Minimal assistance Eating/Feeding Details (indicate cue type and reason): PRN min assist for bilat tasks Grooming: Sitting, Supervision/safety, Oral care, Set up Grooming Details (indicate cue type and reason): seated EOB, pt able to manipulate toothpaste and toothbrush with additional time/effort and occasional cues, fatigued relatively quickly  Upper Body Bathing: Sitting, Minimal assistance Lower Body Bathing: Sit to/from stand, Minimal assistance, Moderate assistance Upper Body Dressing : Sitting, Minimal assistance Lower Body Dressing: Sit to/from stand, Moderate assistance, Minimal assistance Toilet Transfer: BSC, Ambulation, RW, Moderate assistance, +2 for physical assistance Toilet Transfer Details (indicate cue type and reason): cues for RW placement  Cognition: Cognition Overall Cognitive Status: Within Functional Limits for tasks assessed Orientation Level: Oriented X4 Cognition Arousal/Alertness: Awake/alert Behavior During Therapy: WFL for tasks  assessed/performed Overall Cognitive Status: Within Functional Limits for tasks assessed General Comments: A&Ox4  Physical Exam: Blood pressure 133/73, pulse (!) 55, temperature 97.7 F (36.5 C), temperature source Oral, resp. rate 15, height '5\' 6"'  (1.676 m), weight 90.6 kg, SpO2 100 %. Physical Exam  Vitals reviewed. Constitutional: He appears well-developed.  HENT:  Head: Normocephalic.  Mild left facial weakness  Eyes: Pupils are equal, round, and reactive to light.  Neck: Normal range of motion. Neck supple.  Cardiovascular: Normal rate and regular rhythm.  Respiratory: Effort normal and breath sounds normal.  Neurological:  Patient is alert.  Speech mildly dysarthric but intelligible. Left central 7 with tongue deviation.  Follows commands.  Oriented to person place and time. LUE 3-4/5 prox to distal. LLE 3/5. RUE and RLE 5/5.  Skin: Skin is warm and dry.  Psychiatric: He has a normal mood and affect.    Results for orders placed or performed during the hospital encounter of 03/19/18 (from the past 48 hour(s))  Magnesium     Status: None   Collection Time: 03/21/18  4:25 AM  Result Value Ref Range   Magnesium 2.1 1.7 - 2.4 mg/dL    Comment: Performed at [facility], [address] [place], [place] [address]  Basic metabolic panel     Status: Abnormal   Collection Time: 03/21/18  4:25 AM  Result Value Ref Range   Sodium 141 135 - 145 mmol/L   Potassium 4.7 3.5 - 5.1 mmol/L   Chloride 108 98 - 111 mmol/L   CO2 27 22 - 32 mmol/L  Glucose, Bld 104 (H) 70 - 99 mg/dL   BUN 21 (H) 6 - 20 mg/dL   Creatinine, Ser 1.20 0.61 - 1.24 mg/dL   Calcium 8.9 8.9 - 10.3 mg/dL   GFR calc non Af Amer >60 >60 mL/min   GFR calc Af Amer >60 >60 mL/min    Comment: (NOTE) The eGFR has been calculated using the CKD EPI equation. This calculation has not been validated in all clinical situations. eGFR's persistently <60 mL/min signify possible Chronic Kidney Disease.    Anion  gap 6 5 - 15    Comment: Performed at [facility], [address] [place], [place] [address]  TSH     Status: None   Collection Time: 03/21/18  4:25 AM  Result Value Ref Range   TSH 4.131 0.350 - 4.500 uIU/mL    Comment: Performed by a 3rd Generation assay with a functional sensitivity of <=0.01 uIU/mL. Performed at [facility], [address] [place], [place] [address]   LDL cholesterol, direct     Status: Abnormal   Collection Time: 03/21/18  4:25 AM  Result Value Ref Range   Direct LDL 113 (H) 0 - 99 mg/dL    Comment: (NOTE) Performed At: [facility] [place], [place] [id number] Rush Farmer MD RJ:[id number]    No results found.     Medical Problem List and Plan: 1.  Left side weakness with dysarthria secondary to acute infarct right pons  -admit to inpatient rehab 2.  DVT Prophylaxis/Anticoagulation: Subcutaneous Lovenox.  Monitor for any bleeding episodes 3. Pain Management: Tylenol as needed 4. Mood: Provide emotional support 5. Neuropsych: This patient is capable of making decisions on his own behalf. 6. Skin/Wound Care: Routine skin checks 7. Fluids/Electrolytes/Nutrition: Routine in and outs with follow-up chemistries  -encourage PO 8.  Hypertension.  Lisinopril 5 mg daily.  Monitor with increased mobility 9.  Mobitz type I.  Asymptomatic.  Follow-up outpatient cardiology 10.  History of alcohol/tobacco and polysubstance abuse.  Continue NicoDerm patch.  Provide counseling 11.  Hyperlipidemia.  Fenofibrate/Lipitor  Post Admission Physician Evaluation: 1. Functional deficits secondary  to right pontine infarct . 2. Patient is admitted to receive collaborative, interdisciplinary care between the physiatrist, rehab nursing staff, and therapy team. 3. Patient's level of medical complexity and substantial therapy needs in context of that medical necessity cannot be provided at a lesser intensity of care such as a  SNF. 4. Patient has experienced substantial functional loss from his/her baseline which was documented above under the "Functional History" and "Functional Status" headings.  Judging by the patient's diagnosis, physical exam, and functional history, the patient has potential for functional progress which will result in measurable gains while on inpatient rehab.  These gains will be of substantial and practical use upon discharge  in facilitating mobility and self-care at the household level. 5. Physiatrist will provide 24 hour management of medical needs as well as oversight of the therapy plan/treatment and provide guidance as appropriate regarding the interaction of the two. 6. The Preadmission Screening has been reviewed and patient status is unchanged unless otherwise stated above. 7. 24 hour rehab nursing will assist with bladder management, bowel management, safety, skin/wound care, disease management, medication administration and patient education  and help integrate therapy concepts, techniques,education, etc. 8. PT will assess and treat for/with: Lower extremity strength, range of motion, stamina, balance, functional mobility, safety, adaptive techniques and equipment, NMR, family education, community reentry.   Goals are: min assist. 9.  OT will assess and treat for/with: ADL's, functional mobility, safety, upper extremity strength, adaptive techniques and equipment, NMR, family education, ego support.   Goals are: min assist. Therapy may proceed with showering this patient. 10. SLP will assess and treat for/with: cognition, communication .  Goals are: mod I. 11. Case Management and Social Worker will assess and treat for psychological issues and discharge planning. 12. Team conference will be held weekly to assess progress toward goals and to determine barriers to discharge. 13. Patient will receive at least 3 hours of therapy per day at least 5 days per week. 14. ELOS: 13-16 days        15. Prognosis:  excellent   I have personally performed a face to face diagnostic evaluation of this patient and formulated the key components of the plan.  Additionally, I have personally reviewed laboratory data, imaging studies, as well as relevant notes and concur with the physician assistant's documentation above.  Meredith Staggers, MD, FAAPMR    Lavon Paganini Sidney, PA-C 03/22/2018

## 2018-03-22 NOTE — Progress Notes (Signed)
Subjective: Patient's symptoms continue to improve, he reported that his speech is getting better still has some difficulty with getting some words out.  He is able to move his left side but still requires some effort.  Denies no new strokelike symptoms.  He anticipates discharge today to [facility] rehabilitation.  Objective: Current vital signs: BP 133/73 (BP Location: Left Arm)   Pulse (!) 55   Temp 97.7 F (36.5 C) (Oral)   Resp 15   Ht 5\' 6"  (1.676 m)   Wt 90.6 kg   SpO2 100%   BMI 32.24 kg/m  Vital signs in last 24 hours: Temp:  [97.7 F (36.5 C)-98.6 F (37 C)] 97.7 F (36.5 C) (10/11 0358) Pulse Rate:  [55-60] 55 (10/11 0358) Resp:  [14-15] 15 (10/11 0358) BP: (133-135)/(68-73) 133/73 (10/11 0358) SpO2:  [99 %-100 %] 100 % (10/11 0358)  Intake/Output from previous day: 10/10 0701 - 10/11 0700 In: 600 [P.O.:600] Out: 1550 [Urine:1550] Intake/Output this shift: Total I/O In: -  Out: 650 [Urine:650] Nutritional status:  Diet Order            Diet - low sodium heart healthy        Diet heart healthy/carb modified Room service appropriate? Yes; Fluid consistency: Thin  Diet effective now              Neurologic Exam:  Mental Status: Alert, oriented, thought content appropriate.  Mild to moderate dysarthria. Able to follow 3 step commands without difficulty. Attention span and concentration seemed appropriate  Cranial Nerves: II: Discs flat bilaterally; Visual fields grossly normal, pupils equal, round, reactive to light and accommodation III,IV, VI: ptosis not present, extra-ocular motions intact bilaterally V,VII: Mild left facial droop, facial light touch sensationdecreased on the left VIII: hearing normal bilaterally IX,X: gag reflexdeferred XI: bilateral shoulder shrug XII: midline tongue extension Motor: Right :Upper extremity 5/5Without pronator driftLeft: Upper extremity4/5 Right:Lower extremity  5/5Left: Lower extremity4/5 Tone and bulk:normal tone throughout; no atrophy noted Sensory: Pinprick and light touchdecreased on the left Deep Tendon Reflexes: 2+ and symmetric throughout Plantars: Right:DowngoingLeft:Upgoing Cerebellar: Finger-to-nosetesting intact bilaterally.Heel to shin testing normal bilaterally Gait: not tested due to safety concerns  Lab Results: Basic Metabolic Panel: Recent Labs  Lab 03/19/18 1921 03/21/18 0425  NA 142 141  K 3.8 4.7  CL 104 108  CO2 27 27  GLUCOSE 104* 104*  BUN 13 21*  CREATININE 1.11 1.20  CALCIUM 9.4 8.9  MG  --  2.1    Liver Function Tests: Recent Labs  Lab 03/19/18 1921  AST 31  ALT 30  ALKPHOS 99  BILITOT 0.5  PROT 7.6  ALBUMIN 3.9   No results for input(s): LIPASE, AMYLASE in the last 168 hours. No results for input(s): AMMONIA in the last 168 hours.  CBC: Recent Labs  Lab 03/19/18 1921  WBC 10.3  NEUTROABS 4.8  HGB 15.0  HCT 46.8  MCV 88.1  PLT 229    Cardiac Enzymes: Recent Labs  Lab 03/19/18 1921  TROPONINI <0.03    Lipid Panel: Recent Labs  Lab 03/20/18 0517  CHOL 156  TRIG 511*  HDL 33*  CHOLHDL 4.7  VLDL UNABLE TO CALCULATE IF TRIGLYCERIDE OVER 400 mg/dL  LDLCALC UNABLE TO CALCULATE IF TRIGLYCERIDE OVER 400 mg/dL    CBG: Recent Labs  Lab 03/19/18 1917  GLUCAP 98    Microbiology: No results found for this or any previous visit.  Coagulation Studies: Recent Labs    03/19/18 1921  LABPROT  11.7  INR 0.87    Imaging: No results found.  Medications:  I have reviewed the patient's current medications. Prior to Admission:  No medications prior to admission.   Scheduled: . aspirin  81 mg Oral Daily  . atorvastatin  40 mg Oral q1800  . clopidogrel  75 mg Oral Daily  . enoxaparin (LOVENOX) injection  40 mg Subcutaneous Q24H  . fenofibrate  160 mg Oral Daily  . lisinopril  5 mg Oral Daily   . magic mouthwash  10 mL Oral TID  . mouth rinse  15 mL Mouth Rinse BID  . nicotine  21 mg Transdermal Daily    Patient seen and examined.  Clinical course and management discussed.  Necessary edits performed.  I agree with the above.  Assessment and plan of care developed and discussed below.   Assessment:57 y.o.malewith pertinent history of diabetes mellitus, prior strokes, smoking and alcohol use presenting with left sided numbness and weakness, left facial droop, headache, and slurred speech found to have acute right pontine infarct.  Remains stable neurologically with notable improvement in speech and left-sided weakness.  Pending DC to [facility] rehab  Recommendations: 1. Continue medical management with dual therapy Aspirin 81 mg/day and Plavix 75 mg /day with intensive management of vascular risk factor to keep systolic BP (SBP) <140 mm Hg (161 mm Hg if diabetic) and low density lipoprotein (LDL) <70 mg/dl, and lifestyle modification.  2. Statin to keep LDL less than 70 mg/dL 3. ETOH use, advised to drink no more than 2 drink(s) a day 4. Morbid Obesity, Body mass index is 32.24 kg/m., recommend weight loss, diet and exercise as appropriate  5. UDS positive for cocaine use, counseling 6. Patient to follow up with neurology on an outpatient basis for further care and management of prophylactic therapy.    This patient was staffed with Dr. Verlon Au, Thad Ranger who personally evaluated patient, reviewed documentation and agreed with assessment and plan of care as above.  [name], DNP, FNP-BC Board certified Nurse Practitioner Neurology Department   LOS: 3 days   03/22/2018  9:42 AM  Thana Farr, MD Neurology [phone]  03/22/2018  5:46 PM

## 2018-03-22 NOTE — Discharge Summary (Signed)
[facility] - [place] at [facility], 57 y.o., DOB [birth year], MRN [id number]. Admission date: 03/19/2018 Discharge Date 03/22/2018 Primary MD Patient, No Pcp Per Admitting Physician Arnaldo Natal, MD  Admission Diagnosis  Cerebral infarction, unspecified mechanism [facility]) [I63.9]  Discharge Diagnosis   Principal Problem: Acute right pons CVA Hypertension Hyperlipidemia Hypertriglyceridemia Nicotine abuse Sinus pause related to his CVA   Hospital Course  Clinton Knight  is a 57 y.o. male who presents with chief complaint as above.  She had sudden onset discomfort behind his right eye and slurring of speech, followed promptly by left-sided weakness and sensory deficit.  Patient was seen in the ER and had a CT scan which was negative.  Had a MRI of the brain which showed acute stroke.  Patient was admitted for further evaluation and therapy.  Patient underwent evaluation for carotid artery disease which only showed some minor plaque.  No significant stenosis was noted.  Echo showed no evidence of embolism.  Patient was seen by PT and recommended inpatient rehab.             Consults  cardiology,neurology  Significant Tests:  See full reports for all details    Ct Angio Head W Or Wo Contrast  Result Date: 03/19/2018 CLINICAL DATA:  Weakness, slurred speech and confusion. EXAM: CT ANGIOGRAPHY HEAD AND NECK CT PERFUSION BRAIN TECHNIQUE: Multidetector CT imaging of the head and neck was performed using the standard protocol during bolus administration of intravenous contrast. Multiplanar CT image reconstructions and MIPs were obtained to evaluate the vascular anatomy. Carotid stenosis measurements (when applicable) are obtained utilizing NASCET criteria, using the distal internal carotid diameter as the denominator. Multiphase CT imaging of the brain was performed following IV bolus contrast injection. Subsequent parametric perfusion maps were calculated  using RAPID software. CONTRAST:  ISOVUE-370 IOPAMIDOL (ISOVUE-370) INJECTION 76% COMPARISON:  None. FINDINGS: CTA NECK FINDINGS AORTIC ARCH: There is mild calcific atherosclerosis of the aortic arch. There is no aneurysm, dissection or hemodynamically significant stenosis of the visualized ascending aorta and aortic arch. Conventional 3 vessel aortic branching pattern. The visualized proximal subclavian arteries are widely patent. RIGHT CAROTID SYSTEM: --Common carotid artery: Widely patent origin without common carotid artery dissection or aneurysm. --Internal carotid artery: No dissection, occlusion or aneurysm. Mild atherosclerotic calcification at the carotid bifurcation without hemodynamically significant stenosis. --External carotid artery: No acute abnormality. LEFT CAROTID SYSTEM: --Common carotid artery: Widely patent origin without common carotid artery dissection or aneurysm. --Internal carotid artery:No dissection, occlusion or aneurysm. Mild atherosclerotic calcification at the carotid bifurcation without hemodynamically significant stenosis. --External carotid artery: No acute abnormality. VERTEBRAL ARTERIES: Left dominant configuration. Both origins are normal. There is mild-to-moderate narrowing of both V4 segments. SKELETON: There is no bony spinal canal stenosis. No lytic or blastic lesion. OTHER NECK: Normal pharynx, larynx and major salivary glands. No cervical lymphadenopathy. Unremarkable thyroid gland. UPPER CHEST: No pneumothorax or pleural effusion. No nodules or masses. CTA HEAD FINDINGS ANTERIOR CIRCULATION: --Intracranial internal carotid arteries: Moderate atherosclerotic calcification of both internal carotid arteries at the skull base. --Anterior cerebral arteries: Normal. Both A1 segments are present. Patent anterior communicating artery. --Middle cerebral arteries: Normal. --Posterior communicating arteries: Absent bilaterally. POSTERIOR CIRCULATION: --Basilar artery: Normal.  --Posterior cerebral arteries: Normal. --Superior cerebellar arteries: Normal. --Inferior cerebellar arteries: Normal anterior and posterior inferior cerebellar arteries. VENOUS SINUSES: As permitted by contrast timing, patent. ANATOMIC VARIANTS: None DELAYED PHASE: No parenchymal contrast enhancement. Review of the MIP images confirms the above  findings. CT Brain Perfusion Findings: CBF (<30%) Volume: 0mL Perfusion (Tmax>6.0s) volume: 0mL Mismatch Volume: 0 mL Infarction Location:none IMPRESSION: 1. No acute ischemia by CT perfusion criteria. 2. No emergent large vessel occlusion or high-grade stenosis. 3. Moderate atherosclerotic calcification of the internal carotid and vertebral arteries at the skull base. Electronically Signed   By: Deatra Robinson M.D.   On: 03/19/2018 23:08   Ct Angio Neck W And/or Wo Contrast  Result Date: 03/19/2018 CLINICAL DATA:  Weakness, slurred speech and confusion. EXAM: CT ANGIOGRAPHY HEAD AND NECK CT PERFUSION BRAIN TECHNIQUE: Multidetector CT imaging of the head and neck was performed using the standard protocol during bolus administration of intravenous contrast. Multiplanar CT image reconstructions and MIPs were obtained to evaluate the vascular anatomy. Carotid stenosis measurements (when applicable) are obtained utilizing NASCET criteria, using the distal internal carotid diameter as the denominator. Multiphase CT imaging of the brain was performed following IV bolus contrast injection. Subsequent parametric perfusion maps were calculated using RAPID software. CONTRAST:  ISOVUE-370 IOPAMIDOL (ISOVUE-370) INJECTION 76% COMPARISON:  None. FINDINGS: CTA NECK FINDINGS AORTIC ARCH: There is mild calcific atherosclerosis of the aortic arch. There is no aneurysm, dissection or hemodynamically significant stenosis of the visualized ascending aorta and aortic arch. Conventional 3 vessel aortic branching pattern. The visualized proximal subclavian arteries are widely patent.  RIGHT CAROTID SYSTEM: --Common carotid artery: Widely patent origin without common carotid artery dissection or aneurysm. --Internal carotid artery: No dissection, occlusion or aneurysm. Mild atherosclerotic calcification at the carotid bifurcation without hemodynamically significant stenosis. --External carotid artery: No acute abnormality. LEFT CAROTID SYSTEM: --Common carotid artery: Widely patent origin without common carotid artery dissection or aneurysm. --Internal carotid artery:No dissection, occlusion or aneurysm. Mild atherosclerotic calcification at the carotid bifurcation without hemodynamically significant stenosis. --External carotid artery: No acute abnormality. VERTEBRAL ARTERIES: Left dominant configuration. Both origins are normal. There is mild-to-moderate narrowing of both V4 segments. SKELETON: There is no bony spinal canal stenosis. No lytic or blastic lesion. OTHER NECK: Normal pharynx, larynx and major salivary glands. No cervical lymphadenopathy. Unremarkable thyroid gland. UPPER CHEST: No pneumothorax or pleural effusion. No nodules or masses. CTA HEAD FINDINGS ANTERIOR CIRCULATION: --Intracranial internal carotid arteries: Moderate atherosclerotic calcification of both internal carotid arteries at the skull base. --Anterior cerebral arteries: Normal. Both A1 segments are present. Patent anterior communicating artery. --Middle cerebral arteries: Normal. --Posterior communicating arteries: Absent bilaterally. POSTERIOR CIRCULATION: --Basilar artery: Normal. --Posterior cerebral arteries: Normal. --Superior cerebellar arteries: Normal. --Inferior cerebellar arteries: Normal anterior and posterior inferior cerebellar arteries. VENOUS SINUSES: As permitted by contrast timing, patent. ANATOMIC VARIANTS: None DELAYED PHASE: No parenchymal contrast enhancement. Review of the MIP images confirms the above findings. CT Brain Perfusion Findings: CBF (<30%) Volume: 0mL Perfusion (Tmax>6.0s) volume:  0mL Mismatch Volume: 0 mL Infarction Location:none IMPRESSION: 1. No acute ischemia by CT perfusion criteria. 2. No emergent large vessel occlusion or high-grade stenosis. 3. Moderate atherosclerotic calcification of the internal carotid and vertebral arteries at the skull base. Electronically Signed   By: Deatra Robinson M.D.   On: 03/19/2018 23:08   Mr Brain Wo Contrast  Result Date: 03/19/2018 CLINICAL DATA:  Headache and left-sided weakness EXAM: MRI HEAD WITHOUT CONTRAST TECHNIQUE: Multiplanar, multiecho pulse sequences of the brain and surrounding structures were obtained without intravenous contrast. COMPARISON:  Head CT from earlier today FINDINGS: Brain: Acute infarct in the right pons, respecting midline. Although signal is weak, there is definite restricted diffusion. Remote lateral lenticulostriate infarcts bilaterally, affecting putamen and deep white matter tracts.  Mild chronic small vessel ischemic gliosis in the cerebral white matter. Small remote left cerebellar infarct. No acute hemorrhage, hydrocephalus, or collection. Vascular: Major flow voids are preserved Skull and upper cervical spine: No evidence of marrow lesion Sinuses/Orbits: Negative IMPRESSION: 1. Acute infarct in the right pons. 2. Remote basal ganglia and left cerebellar infarcts. Electronically Signed   By: Marnee Spring M.D.   On: 03/19/2018 21:41   Ct Cerebral Perfusion W Contrast  Result Date: 03/19/2018 CLINICAL DATA:  Weakness, slurred speech and confusion. EXAM: CT ANGIOGRAPHY HEAD AND NECK CT PERFUSION BRAIN TECHNIQUE: Multidetector CT imaging of the head and neck was performed using the standard protocol during bolus administration of intravenous contrast. Multiplanar CT image reconstructions and MIPs were obtained to evaluate the vascular anatomy. Carotid stenosis measurements (when applicable) are obtained utilizing NASCET criteria, using the distal internal carotid diameter as the denominator. Multiphase CT imaging  of the brain was performed following IV bolus contrast injection. Subsequent parametric perfusion maps were calculated using RAPID software. CONTRAST:  ISOVUE-370 IOPAMIDOL (ISOVUE-370) INJECTION 76% COMPARISON:  None. FINDINGS: CTA NECK FINDINGS AORTIC ARCH: There is mild calcific atherosclerosis of the aortic arch. There is no aneurysm, dissection or hemodynamically significant stenosis of the visualized ascending aorta and aortic arch. Conventional 3 vessel aortic branching pattern. The visualized proximal subclavian arteries are widely patent. RIGHT CAROTID SYSTEM: --Common carotid artery: Widely patent origin without common carotid artery dissection or aneurysm. --Internal carotid artery: No dissection, occlusion or aneurysm. Mild atherosclerotic calcification at the carotid bifurcation without hemodynamically significant stenosis. --External carotid artery: No acute abnormality. LEFT CAROTID SYSTEM: --Common carotid artery: Widely patent origin without common carotid artery dissection or aneurysm. --Internal carotid artery:No dissection, occlusion or aneurysm. Mild atherosclerotic calcification at the carotid bifurcation without hemodynamically significant stenosis. --External carotid artery: No acute abnormality. VERTEBRAL ARTERIES: Left dominant configuration. Both origins are normal. There is mild-to-moderate narrowing of both V4 segments. SKELETON: There is no bony spinal canal stenosis. No lytic or blastic lesion. OTHER NECK: Normal pharynx, larynx and major salivary glands. No cervical lymphadenopathy. Unremarkable thyroid gland. UPPER CHEST: No pneumothorax or pleural effusion. No nodules or masses. CTA HEAD FINDINGS ANTERIOR CIRCULATION: --Intracranial internal carotid arteries: Moderate atherosclerotic calcification of both internal carotid arteries at the skull base. --Anterior cerebral arteries: Normal. Both A1 segments are present. Patent anterior communicating artery. --Middle cerebral  arteries: Normal. --Posterior communicating arteries: Absent bilaterally. POSTERIOR CIRCULATION: --Basilar artery: Normal. --Posterior cerebral arteries: Normal. --Superior cerebellar arteries: Normal. --Inferior cerebellar arteries: Normal anterior and posterior inferior cerebellar arteries. VENOUS SINUSES: As permitted by contrast timing, patent. ANATOMIC VARIANTS: None DELAYED PHASE: No parenchymal contrast enhancement. Review of the MIP images confirms the above findings. CT Brain Perfusion Findings: CBF (<30%) Volume: 0mL Perfusion (Tmax>6.0s) volume: 0mL Mismatch Volume: 0 mL Infarction Location:none IMPRESSION: 1. No acute ischemia by CT perfusion criteria. 2. No emergent large vessel occlusion or high-grade stenosis. 3. Moderate atherosclerotic calcification of the internal carotid and vertebral arteries at the skull base. Electronically Signed   By: Deatra Robinson M.D.   On: 03/19/2018 23:08   Ct Head Code Stroke Wo Contrast  Result Date: 03/19/2018 CLINICAL DATA:  Code stroke. Sudden onset of weakness and slurred speech beginning 1 hour ago EXAM: CT HEAD WITHOUT CONTRAST TECHNIQUE: Contiguous axial images were obtained from the base of the skull through the vertex without intravenous contrast. COMPARISON:  None. FINDINGS: Brain: No evidence of acute infarction, hemorrhage, hydrocephalus, extra-axial collection or mass lesion/mass effect. Remote appearing bilateral basal ganglia  infarcts. Small remote left cerebellar infarct. Vascular: Atherosclerotic calcification.  No hyperdense vessel. Skull: Negative Sinuses/Orbits: Negative Other: These results were called by telephone at the time of interpretation on 03/19/2018 at 7:23 pm to Dr. Darnelle Catalan, who verbally acknowledged these results. ASPECTS [facility] Stroke Program Early CT Score) -when accounting for chronic infarct. - Ganglionic level infarction (caudate, lentiform nuclei, internal capsule, insula, M1-M3 cortex): 7 - Supraganglionic infarction (M4-M6  cortex): 3 Total score (0-10 with 10 being normal): 10 IMPRESSION: 1. No acute finding. 2. Remote bilateral basal ganglia and left cerebellar infarcts. Electronically Signed   By: Marnee Spring M.D.   On: 03/19/2018 19:25       Today   Subjective:   Elana Alm patient's left-sided weakness is improved Objective:   Blood pressure 133/73, pulse (!) 55, temperature 97.7 F (36.5 C), temperature source Oral, resp. rate 15, height 5\' 6"  (1.676 m), weight 90.6 kg, SpO2 100 %.  .  Intake/Output Summary (Last 24 hours) at 03/22/2018 0845 Last data filed at 03/22/2018 0420 Gross per 24 hour  Intake 600 ml  Output 1550 ml  Net -950 ml    Exam VITAL SIGNS: Blood pressure 133/73, pulse (!) 55, temperature 97.7 F (36.5 C), temperature source Oral, resp. rate 15, height 5\' 6"  (1.676 m), weight 90.6 kg, SpO2 100 %.  GENERAL:  57 y.o.-year-old patient lying in the bed with no acute distress.  EYES: Pupils equal, round, reactive to light and accommodation. No scleral icterus. Extraocular muscles intact.  HEENT: Head atraumatic, normocephalic. Oropharynx and nasopharynx clear.  NECK:  Supple, no jugular venous distention. No thyroid enlargement, no tenderness.  LUNGS: Normal breath sounds bilaterally, no wheezing, rales,rhonchi or crepitation. No use of accessory muscles of respiration.  CARDIOVASCULAR: S1, S2 normal. No murmurs, rubs, or gallops.  ABDOMEN: Soft, nontender, nondistended. Bowel sounds present. No organomegaly or mass.  EXTREMITIES: No pedal edema, cyanosis, or clubbing.  NEUROLOGIC: Cranial nerves II through XII are intact.  Muscle strength 4 out of 5 in upper and lower extremity PSYCHIATRIC: The patient is alert and oriented x 3.  SKIN: No obvious rash, lesion, or ulcer.   Data Review     CBC w Diff:  Lab Results  Component Value Date   WBC 10.3 03/19/2018   HGB 15.0 03/19/2018   HGB 14.4 01/29/2014   HCT 46.8 03/19/2018   HCT 45.2 01/29/2014   PLT 229 03/19/2018    PLT 227 01/29/2014   LYMPHOPCT 43 03/19/2018   LYMPHOPCT 38.3 01/29/2014   MONOPCT 8 03/19/2018   MONOPCT 7.5 01/29/2014   EOSPCT 3 03/19/2018   EOSPCT 2.1 01/29/2014   BASOPCT 1 03/19/2018   BASOPCT 0.7 01/29/2014   CMP:  Lab Results  Component Value Date   NA 141 03/21/2018   NA 138 01/29/2014   K 4.7 03/21/2018   K 4.4 01/29/2014   CL 108 03/21/2018   CL 106 01/29/2014   CO2 27 03/21/2018   CO2 22 01/29/2014   BUN 21 (H) 03/21/2018   BUN 11 01/29/2014   CREATININE 1.20 03/21/2018   CREATININE 1.00 01/29/2014   PROT 7.6 03/19/2018   PROT 7.7 01/29/2014   ALBUMIN 3.9 03/19/2018   ALBUMIN 3.8 01/29/2014   BILITOT 0.5 03/19/2018   BILITOT 0.5 01/29/2014   ALKPHOS 99 03/19/2018   ALKPHOS 102 01/29/2014   AST 31 03/19/2018   AST 44 (H) 01/29/2014   ALT 30 03/19/2018   ALT 38 01/29/2014  .  Micro Results No results found for  this or any previous visit (from the past 240 hour(s)).      Code Status Orders  (From admission, onward)         Start     Ordered   03/20/18 0005  Full code  Continuous     03/20/18 0004        Code Status History    This patient has a current code status but no historical code status.            Discharge Medications   Allergies as of 03/22/2018   No Known Allergies     Medication List    TAKE these medications   aspirin 81 MG chewable tablet Chew 1 tablet (81 mg total) by mouth daily.   atorvastatin 40 MG tablet Commonly known as:  LIPITOR Take 1 tablet (40 mg total) by mouth daily at 6 PM.   clopidogrel 75 MG tablet Commonly known as:  PLAVIX Take 1 tablet (75 mg total) by mouth daily.   fenofibrate 160 MG tablet Take 1 tablet (160 mg total) by mouth daily.   lisinopril 5 MG tablet Commonly known as:  PRINIVIL,ZESTRIL Take 1 tablet (5 mg total) by mouth daily.   nicotine 21 mg/24hr patch Commonly known as:  NICODERM CQ - dosed in mg/24 hours Place 1 patch (21 mg total) onto the skin daily.           Total Time in preparing paper work, data evaluation and todays exam - 35 minutes  Auburn Bilberry M.D on 03/22/2018 at 8:45 AM [facility]   Office  [phone]

## 2018-03-22 NOTE — H&P (Signed)
Physical Medicine and Rehabilitation Admission H&P       Chief Complaint  Patient presents with  . Code Stroke  : HPI: Clinton Knight is a 57 year old right-handed male with history of chronic renal insufficiency with creatinine 1.1, alcohol/tobacco and cocaine abuse, diabetes mellitus, hypertension.  Per chart review patient independent prior to admission.  Plan is to discharge home with sister and multiple family members that can provide assistance as needed.  Patient on no prescription medications at time of admission.  Presented 03/19/2018 to [facility] with left side weakness, numbness and slurred speech.  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  Urine drug screen positive cocaine.  Troponin negative.  MRI showed acute infarct in the right pons.  T angiogram of head and neck with no acute ischemia, no emergent large vessel occlusion.  Echocardiogram with ejection fraction of 65% no wall motion abnormalities.  Cardiology consulted for evaluation of sinus pauses noted on telemetry.  No evidence of high-grade AV block noted.  Advised to avoid any medications that could cause bradycardia no further work-up was indicated.  Maintained on aspirin and Plavix for CVA prophylaxis.  Subcutaneous Lovenox for DVT prophylaxis.  Tolerating a regular diet.  Therapy evaluations completed with recommendations of physical medicine rehab consult.  Patient was admitted for a comprehensive rehab program.  Review of Systems  Constitutional: Negative for chills and fever.  HENT: Negative for hearing loss.   Eyes: Negative for blurred vision and double vision.  Respiratory: Positive for cough. Negative for shortness of breath.   Cardiovascular: Negative for chest pain, palpitations and leg swelling.  Gastrointestinal: Positive for constipation. Negative for nausea and vomiting.  Genitourinary: Negative for  dysuria, flank pain and hematuria.  Skin: Negative for rash.  Neurological: Positive for speech change and focal weakness.  All other systems reviewed and are negative.      Past Medical History:  Diagnosis Date  . Acute kidney insufficiency    03/2018: Cr 1.1 in setting of stroke (baseline ~0.9)  . Alcohol abuse   . Cocaine abuse ([place])    03/2018: Postitive cocaine UA tox screen  . Diabetes ([place])    03/20/18: A1C 6.4  . Hypertension   . Hypertriglyceridemia    03/20/18 TG 511  . Nicotine abuse   . Stroke [facility])         Past Surgical History:  Procedure Laterality Date  . NO PAST SURGERIES          Family History  Problem Relation Age of Onset  . Diabetes Mother   . Stroke Mother   . Hypertension Mother   . Heart attack Brother 16       Brother, MI, 53 yo  . Thyroid disease Daughter        Removed her entire thyroid   Social History:  reports that he has been smoking cigarettes. He has been smoking about 1.00 pack per day. He has never used smokeless tobacco. He reports that he drinks alcohol. He reports that he has current or past drug history. Drug: Cocaine. Allergies: No Known Allergies No medications prior to admission.    Drug Regimen Review Drug regimen was reviewed and remains appropriate with no significant issues identified  Home: Home Living Family/patient expects to be discharged to:: Unsure Additional Comments: At this date pt states they are between places and do not know where they will go at dc.  Functional History: Prior Function Level of Independence: Independent Comments: Per pt he was independent with all mobility, ADL, and IADL. Eager and actively looking for work but not currently employed.   Functional Status:  Mobility: Bed Mobility Overal bed mobility: Needs Assistance Bed Mobility: Sit to Supine Supine to sit: Min assist Sit to supine: Min assist General bed mobility comments: Not assessed, pt sitting at  EOB eating breakfast on arrival. Transfers Overall transfer level: Needs assistance Equipment used: Rolling walker (2 wheeled) Transfers: Sit to/from Stand Sit to Stand: Mod assist, +2 physical assistance General transfer comment: Pt has improved anterior translation, and uses RW for UE support. VC used for initial sit-to-stand sequencing, but not required after as pt demonstrated appropriate sequencing and safety. Intermittent VC used to "stand up tall" for improved posturing. Noted buckling of L knee in stand, tactile cues given, could straighten LE.  Ambulation/Gait Ambulation/Gait assistance: Mod assist, +2 physical assistance Gait Distance (Feet): 10 Feet Assistive device: Rolling walker (2 wheeled) Gait Pattern/deviations: Step-to pattern General Gait Details: Pt amb with B dec foot clearance and step length, dec L heel contact. Pt has mild L trunk lean Gait velocity: decreased  ADL: ADL Overall ADL's : Needs assistance/impaired Eating/Feeding: Sitting, Set up, Minimal assistance Eating/Feeding Details (indicate cue type and reason): PRN min assist for bilat tasks Grooming: Sitting, Supervision/safety, Oral care, Set up Grooming Details (indicate cue type and reason): seated EOB, pt able to manipulate toothpaste and toothbrush with additional time/effort and occasional cues, fatigued relatively quickly  Upper Body Bathing: Sitting, Minimal assistance Lower Body Bathing: Sit to/from stand, Minimal assistance, Moderate assistance Upper Body Dressing : Sitting, Minimal assistance Lower Body Dressing: Sit to/from stand, Moderate assistance, Minimal assistance Toilet Transfer: BSC, Ambulation, RW, Moderate assistance, +2 for physical assistance Toilet Transfer Details (indicate cue type and reason): cues for RW placement  Cognition: Cognition Overall Cognitive Status: Within Functional Limits for tasks assessed Orientation Level: Oriented X4 Cognition Arousal/Alertness:  Awake/alert Behavior During Therapy: WFL for tasks assessed/performed Overall Cognitive Status: Within Functional Limits for tasks assessed General Comments: A&Ox4  Physical Exam: Blood pressure 133/73, pulse (!) 55, temperature 97.7 F (36.5 C), temperature source Oral, resp. rate 15, height _0  (1.676 m), weight 90.6 kg, SpO2 100 %. Physical Exam  Vitals reviewed. Constitutional: He appears well-developed.  HENT:  Head: Normocephalic.  Mild left facial weakness  Eyes: Pupils are equal, round, and reactive to light.  Neck: Normal range of motion. Neck supple.  Cardiovascular: Normal rate and regular rhythm.  Respiratory: Effort normal and breath sounds normal.  Neurological:  Patient is alert.  Speech mildly dysarthric but intelligible. Left central 7 with tongue deviation.  Follows commands.  Oriented to person place and time. LUE 3-4/5 prox to distal. LLE 3/5. RUE and RLE 5/5.  Skin: Skin is warm and dry.  Psychiatric: He has a normal mood and affect.    LabResultsLast48Hours        Results for orders placed or performed during the hospital encounter of 03/19/18 (from the past 48 hour(s))  Magnesium     Status: None   Collection Time: 03/21/18  4:25 AM  Result Value Ref Range   Magnesium 2.1 1.7 - 2.4 mg/dL    Comment: Performed at [facility], [address] [place], [place] [address]  Basic metabolic panel     Status: Abnormal   Collection Time: 03/21/18  4:25 AM  Result Value Ref Range   Sodium 141 135 - 145 mmol/L  Potassium 4.7 3.5 - 5.1 mmol/L   Chloride 108 98 - 111 mmol/L   CO2 27 22 - 32 mmol/L   Glucose, Bld 104 (H) 70 - 99 mg/dL   BUN 21 (H) 6 - 20 mg/dL   Creatinine, Ser 1.20 0.61 - 1.24 mg/dL   Calcium 8.9 8.9 - 10.3 mg/dL   GFR calc non Af Amer >60 >60 mL/min   GFR calc Af Amer >60 >60 mL/min    Comment: (NOTE) The eGFR has been calculated using the CKD EPI equation. This calculation has not been validated in all  clinical situations. eGFR's persistently <60 mL/min signify possible Chronic Kidney Disease.    Anion gap 6 5 - 15    Comment: Performed at [facility], [address] [place], [place] [address]  TSH     Status: None   Collection Time: 03/21/18  4:25 AM  Result Value Ref Range   TSH 4.131 0.350 - 4.500 uIU/mL    Comment: Performed by a 3rd Generation assay with a functional sensitivity of <=0.01 uIU/mL. Performed at [facility], [address] [place], [place] [address]   LDL cholesterol, direct     Status: Abnormal   Collection Time: 03/21/18  4:25 AM  Result Value Ref Range   Direct LDL 113 (H) 0 - 99 mg/dL    Comment: (NOTE) Performed At: [facility] [place], [place] [id number] Rush Farmer MD ER:[id number]      ImagingResults(Last48hours)  No results found.       Medical Problem List and Plan: 1.  Left side weakness with dysarthria secondary to acute infarct right pons             -admit to inpatient rehab 2.  DVT Prophylaxis/Anticoagulation: Subcutaneous Lovenox.  Monitor for any bleeding episodes 3. Pain Management: Tylenol as needed 4. Mood: Provide emotional support 5. Neuropsych: This patient is capable of making decisions on his own behalf. 6. Skin/Wound Care: Routine skin checks 7. Fluids/Electrolytes/Nutrition: Routine in and outs with follow-up chemistries             -encourage PO 8.  Hypertension.  Lisinopril 5 mg daily.  Monitor with increased mobility 9.  Mobitz type I.  Asymptomatic.  Follow-up outpatient cardiology 10.  History of alcohol/tobacco and polysubstance abuse.  Continue NicoDerm patch.  Provide counseling 11.  Hyperlipidemia.  Fenofibrate/Lipitor  Post Admission Physician Evaluation: 1. Functional deficits secondary  to right pontine infarct . 2. Patient is admitted to receive collaborative, interdisciplinary care between the physiatrist, rehab nursing staff, and  therapy team. 3. Patient's level of medical complexity and substantial therapy needs in context of that medical necessity cannot be provided at a lesser intensity of care such as a SNF. 4. Patient has experienced substantial functional loss from his/her baseline which was documented above under the "Functional History" and "Functional Status" headings.  Judging by the patient's diagnosis, physical exam, and functional history, the patient has potential for functional progress which will result in measurable gains while on inpatient rehab.  These gains will be of substantial and practical use upon discharge  in facilitating mobility and self-care at the household level. 5. Physiatrist will provide 24 hour management of medical needs as well as oversight of the therapy plan/treatment and provide guidance as appropriate regarding the interaction of the two. 6. The Preadmission Screening has been reviewed and patient status is unchanged unless otherwise stated above. 7. 24 hour rehab nursing will assist with bladder management, bowel management,  safety, skin/wound care, disease management, medication administration and patient education  and help integrate therapy concepts, techniques,education, etc. 8. PT will assess and treat for/with: Lower extremity strength, range of motion, stamina, balance, functional mobility, safety, adaptive techniques and equipment, NMR, family education, community reentry.   Goals are: min assist. 9. OT will assess and treat for/with: ADL's, functional mobility, safety, upper extremity strength, adaptive techniques and equipment, NMR, family education, ego support.   Goals are: min assist. Therapy may proceed with showering this patient. 10. SLP will assess and treat for/with: cognition, communication .  Goals are: mod I. 11. Case Management and Social Worker will assess and treat for psychological issues and discharge planning. 12. Team conference will be held weekly to assess  progress toward goals and to determine barriers to discharge. 13. Patient will receive at least 3 hours of therapy per day at least 5 days per week. 14. ELOS: 13-16 days       15. Prognosis:  excellent   I have personally performed a face to face diagnostic evaluation of this patient and formulated the key components of the plan.  Additionally, I have personally reviewed laboratory data, imaging studies, as well as relevant notes and concur with the physician assistant's documentation above.  Meredith Staggers, MD, Mellody Drown    Lavon Paganini Barclay, PA-C 03/22/2018   The patient's status has not changed. The original post admission physician evaluation remains appropriate, and any changes from the pre-admission screening or documentation from the acute chart are noted above.  Meredith Staggers, MD 03/22/2018

## 2018-03-22 NOTE — Progress Notes (Signed)
Inpatient Rehabilitation-Admissions Coordinator   Met with patient at the bedside to ensure intent on participating in CIR. Pt wants to pursue rehab program and again, stated he understood daily cost of care at [facility], recalling amount discussed yesterday. Pt signed consent forms and has no further questions about the program. AC was supposed to meet with his sister Rocco Serene this morning at 7 to firm up DC plans, however, she did not show. Pt was able to get in contact with one sister, Glenard Haring, who showed and stated her support. Pending approval by Dr. Posey Pronto, plan to admit pt to CIR today.   AC has communicated plans with Hassan Rowan, CM.   Please call if questions.   Jhonnie Garner, OTR/L  Rehab Admissions Coordinator  [phone] 03/22/2018 8:23 AM

## 2018-03-22 NOTE — Progress Notes (Signed)
Physical Therapy Treatment Patient Details Name: Clinton Knight MRN: [id number] DOB: May 11, 1961 Today's Date: 03/22/2018    History of Present Illness Pt 56yo male presents to ED with discomfort behind R eye and slurring speech, soon after had sx of L sided weakness and sensory deficit, admitted for CVA on 10/82019. 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 Pt's PMH includes CVA. Per pt not hx of falls.    PT Comments    Pt is motivated and agreeable to PT on arrival. Pt continues to progress mobility requiring Mod A and RW. Transfers sit-to-stand with wide BOS, he is fairly unsteady on his feet with inc sway requiring physical support. He ambulates greater distance, but continues to require VC for inc heel contact and lifting foot higher off ground. Pt educated on the importance of having medical staff with him during all mobility for safety. PT recommendation for CIR at dc remains appropriate this date. Will continue to progress pt as able.   Follow Up Recommendations  CIR     Equipment Recommendations  Rolling walker with 5" wheels    Recommendations for Other Services       Precautions / Restrictions Precautions Precautions: Fall Restrictions Weight Bearing Restrictions: No    Mobility  Bed Mobility               General bed mobility comments: Not assessed, pt sitting in recliner on arrival  Transfers Overall transfer level: Needs assistance Equipment used: Rolling walker (2 wheeled) Transfers: Sit to/from Stand Sit to Stand: Mod assist         General transfer comment: Pt continues to improve sit-to-stand. 4 attempts with RW for UE support with increased anterior translation, in standing pt has flexed trunk used VC to correct posturing and engagement of posterior chain. 4 attempts performed without UE support.  Pt is in high guard and even wider BOS noted. Lowering to chair pt requires hands on LE for support to control down. Continues to "plop" in chair demonstrating dec eccentric control. In standing pt is very unsteady, has inc swaying, CGA-min A to maintain postural control. All attempts noted R lateral trunk lean t/o, improved some with tacile and VC but continues to have difficulty.  Ambulation/Gait Ambulation/Gait assistance: Mod assist Gait Distance (Feet): 20 Feet Assistive device: Rolling walker (2 wheeled) Gait Pattern/deviations: Step-to pattern Gait velocity: decreased   General Gait Details: Pt amb with B dec foot clearance and step length, dec L heel contact. Pt has mild L trunk lean   Stairs             Wheelchair Mobility    Modified Rankin (Stroke Patients Only)       Balance Overall balance assessment: Needs assistance Sitting-balance support: No upper extremity supported;Feet supported Sitting balance-Leahy Scale: Good Sitting balance - Comments: Pt tolerates moderate weight shift in all directions without LOB. In quiet sitting pt has L lateral lean present, but with VC can correct. Can maintain sitting at EOB >10 minutes with single UE support. Postural control: Left lateral lean Standing balance support: Bilateral upper extremity supported Standing balance-Leahy Scale: Poor Standing balance comment: Continues to WB R > L. Wide BOS noted. Uses RW for UE support, without RW is in high guard and requires CGA-min A to maintain 4 sec.  Cognition Arousal/Alertness: Awake/alert Behavior During Therapy: WFL for tasks assessed/performed Overall Cognitive Status: Within Functional Limits for tasks assessed                                 General Comments: A&Ox4      Exercises Other Exercises Other Exercises: Seated AROM ankle DF/PF, marching and LAQs. Other Exercises: Standing marching in place 10 reps each  side. Noted buckling when in stance on LLE. Tactile cues given, with Min A pt corrects. Other Exercises: Seated pt performed reaching outside BOS with LUE multiple directions.    General Comments        Pertinent Vitals/Pain Pain Assessment: No/denies pain Pain Score: 0-No pain    Home Living                      Prior Function            PT Goals (current goals can now be found in the care plan section) Acute Rehab PT Goals Patient Stated Goal: To be independent again. PT Goal Formulation: With patient Time For Goal Achievement: 04/03/18 Potential to Achieve Goals: Fair Progress towards PT goals: Progressing toward goals    Frequency    7X/week      PT Plan Current plan remains appropriate    Co-evaluation              AM-PAC PT "6 Clicks" Daily Activity  Outcome Measure  Difficulty turning over in bed (including adjusting bedclothes, sheets and blankets)?: Unable Difficulty moving from lying on back to sitting on the side of the bed? : Unable Difficulty sitting down on and standing up from a chair with arms (e.g., wheelchair, bedside commode, etc,.)?: Unable Help needed moving to and from a bed to chair (including a wheelchair)?: A Lot Help needed walking in hospital room?: A Lot Help needed climbing 3-5 steps with a railing? : A Lot 6 Click Score: 9    End of Session Equipment Utilized During Treatment: Gait belt Activity Tolerance: Patient tolerated treatment well Patient left: in chair;with call bell/phone within reach;with chair alarm set Nurse Communication: Mobility status PT Visit Diagnosis: Unsteadiness on feet (R26.81);Muscle weakness (generalized) (M62.81);Hemiplegia and hemiparesis;Pain Hemiplegia - Right/Left: Left Hemiplegia - dominant/non-dominant: Non-dominant Hemiplegia - caused by: Cerebral infarction     Time: 0812-0857 PT Time Calculation (min) (ACUTE ONLY): 45 min  Charges:                        Arvilla Meres,  SPT 03/22/2018, 10:53 AM

## 2018-03-22 NOTE — Care Management (Signed)
Spoke with Nanine Means this morning. Accepting Mr. Sturgell at [facility] today. Dr. Auburn Bilberry updated. Nurse April updated. Will be transported to 4-W-19 per [facility]. Gwenette Greet RN MSN CCM Care Management [phone]

## 2018-03-22 NOTE — Progress Notes (Signed)
[place] PMR Admission Coordinator Pre-Admission Assessment  Patient: Clinton Knight is an 57 y.o., male MRN: [id number] DOB: [birth year] Height: 5' 6" (167.6 cm) Weight: 90.6 kg  Insurance Information HMO:     PPO:      PCP:      IPA:      80/20:      OTHER:  PRIMARY: Pt is uninsured (Self Pay)      Policy#:       Subscriber:  CM Name:       Phone#:      Fax#:  Pre-Cert#:       Employer:  Benefits:  Phone #:      Name:  Eff. Date:      Deduct:       Out of Pocket Max:       Life Max:  CIR:  Pt and family understand the estimated daily cost of care and has been provided with financial counselor information.     SNF:  Outpatient:      Co-Pay:  Home Health:       Co-Pay:  DME:      Co-Pay:  Providers:  SECONDARY:       Policy#:       Subscriber:  CM Name:       Phone#:      Fax#:  Pre-Cert#:       Employer:  Benefits:  Phone #:      Name:  Eff. 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 History of Present Illness: Pt is a 57 yo M with history of HTN, DM2, alcohol abuse, history of cocaine use with most recent use 10/6, current tobacco abuse. He presented to [facility] with sudden onset of discomfort behind right eye and slurred speech as well as left sided weakness and sensory deficit.  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 There was no evidence of high-grade AV block on baseline EKG with recommendations for sleep apnea screening. Pt continues to have left sided weakness and gait disturbance. PT and OT recommendations are for CIR. Pt is to be admitted to CIR on 03/22/18.   Patient's medical record from [facility] has been reviewed by the rehabilitation admission coordinator and physician.   Past Medical History      Past Medical History:  Diagnosis Date  . Acute kidney insufficiency    03/2018: Cr 1.1 in setting of stroke (baseline ~0.9)  . Alcohol abuse   . Cocaine abuse ([place])  03/2018: Postitive cocaine UA tox screen  . Diabetes ([place])    03/20/18: A1C 6.4  . Hypertension   . Hypertriglyceridemia    03/20/18 TG 511  . Nicotine abuse   . Stroke [facility])     Family History   family history includes Diabetes in his mother; Heart attack (age of onset: 24) in his brother; Hypertension in his mother; Stroke in his mother; Thyroid disease in his daughter.  Prior Rehab/Hospitalizations Has the patient had major surgery during 100 days prior to admission? No               Current Medications  Current Facility-Administered Medications:  .  acetaminophen (TYLENOL) tablet 650 mg, 650 mg, Oral, Q4H PRN, 650 mg at 03/20/18 1002 **OR** acetaminophen (TYLENOL) solution 650 mg, 650 mg, Per Tube, Q4H PRN **OR** acetaminophen (TYLENOL) suppository 650 mg, 650 mg, Rectal, Q4H PRN, Lance Coon, MD .  aspirin chewable tablet 81 mg, 81 mg, Oral, Daily, Dustin Flock, MD, 81 mg at 03/21/18 0923 .  atorvastatin (LIPITOR) tablet 40 mg, 40 mg, Oral, q1800, Dustin Flock, MD, 40 mg at 03/21/18 1718 .  clopidogrel (PLAVIX) tablet 75 mg, 75 mg, Oral, Daily, Dustin Flock, MD, 75 mg at 03/21/18 1718 .  enoxaparin (LOVENOX) injection 40 mg, 40 mg, Subcutaneous, Q24H,  Lance Coon, MD, 40 mg at 03/21/18 2031 .  fenofibrate tablet 160 mg, 160 mg, Oral, Daily, Dustin Flock, MD, 160 mg at 03/21/18 0923 .  lisinopril (PRINIVIL,ZESTRIL) tablet 5 mg, 5 mg, Oral, Daily, Dustin Flock, MD, 5 mg at 03/21/18 2031 .  magic mouthwash, 10 mL, Oral, TID, Dustin Flock, MD, 10 mL at 03/21/18 2031 .  MEDLINE mouth rinse, 15 mL, Mouth Rinse, BID, Lance Coon, MD, 15 mL at 03/21/18 0923 .  nicotine (NICODERM CQ - dosed in mg/24 hours) patch 21 mg, 21 mg, Transdermal, Daily, Dustin Flock, MD, 21 mg at 03/21/18 0923 .  oxyCODONE (Oxy IR/ROXICODONE) immediate release tablet 5 mg, 5 mg, Oral, Q6H PRN, Lance Coon, MD  Patients Current Diet:      Diet Order                  Diet heart healthy/carb modified Room service appropriate? Yes; Fluid consistency: Thin  Diet effective now               Precautions / Restrictions Precautions Precautions: Fall Restrictions Weight Bearing Restrictions: No   Has the patient had 2 or more falls or a fall with injury in the past year?Yes  Prior Activity Level  Prior Functional Level Do you want Prior Function Level of Independence: Independent Comments: Per pt he was independent with all mobility, ADL, and IADL. Eager and actively looking for work but not currently employed.  from other? Self Care: Did the patient need help bathing, dressing, using the toilet or eating?  Independent  Indoor Mobility: Did the patient need assistance with walking from room to room (with or without device)? Independent  Stairs: Did the patient need assistance with internal or external stairs (with or without device)? Independent  Functional Cognition: Did the patient need help planning regular tasks such as shopping or remembering to take medications? Independent  Home Assistive Devices / Equipment Home Assistive Devices/Equipment: None  Prior Device Use: Indicate devices/aids used by the patient prior to  current illness, exacerbation or injury? None of the above  Prior Functional Level Comments: Per pt he was independent with all mobility, ADL, and IADL. Eager and actively looking for  work but not currently employed.    Prior Functional Level Current Functional Level  Bed Mobility Independent Min A  Transfers Independent  Mod A x2  Mobility - Walk/Wheelchair Independent Mod A x2  Mobility - Ambulation/Gait Independent Mod assist, +2 physical assistance  Upper Body Dressing Independent Sitting, Minimal assistance  Lower Body Dressing Independent Sit to/from stand, Moderate assistance, Minimal assistance  Grooming Independent Sitting, Supervision/safety, Oral care, Set up  Eating/Drinking Independent Sitting, Set up, Minimal assistance  Toilet Transfer Independent BSC, Ambulation, RW, Moderate assistance, +2 for physical assistance  Bladder Continence continent  continent  Bowel Management  continent  continent  Stair Climbing independent  unable  Communication WNL Slightly slurred  Memory WNL  appears intact  Cooking/Meal Prep  Independent      Housework  Independent   Money Management  Independent   Driving       Special needs/care consideration BiPAP/CPAP:No CPM: no Continuous Drip IV: no Dialysis: no        Days: no Life Vest:No Oxygen: no Special Bed: no Trach Size: no Wound Vac (area): no      Location: no Skin: abrasion to right upper back                          Bowel mgmt:last BM: 03/20/18 Bladder mgmt:continent; use of urinal.  Diabetic mgmt: glucose being monitored in acute  Previous Home Environment Home Care Services: No Additional Comments: At this date pt states they are between places and do not know where they will go at dc.  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 Discharge Plan Discussed with Primary Caregiver: Yes(with Rocco Serene and Juluis Rainier ) Is Caregiver In Agreement with Plan?: Yes Does Caregiver/Family have Issues with Lodging/Transportation while Pt is in Rehab?: No  Goals/Additional Needs Patient/Family Goal for Rehab: PT/OT: Min A; SLP: Mod I Expected length of stay: 13-16 days Cultural Considerations: [place] Dietary Needs: heart healthy, thin liquids Equipment Needs: TBD Pt/Family Agrees to Admission and willing to participate: Yes Program Orientation Provided & Reviewed with Pt/Caregiver Including Roles  & Responsibilities: Yes(pt, and two sisters)  Barriers to Discharge: Lack of/limited family support, Other (comments)(pt reports intermittant homelessness; sister agreed to house)  Patient Condition: I have reviewed medical records from [place], spoken with [place] and CM Hassan Rowan, and patient and his two sisters. I met with patient at the bedside as well as discussed via phone to gather all the information required for inpatient rehabilitation assessment.  Patient will benefit from ongoing PT, OT, and SLP, can actively  participate in 3 hours of therapy a day 5 days of the week, and can make measurable gains during the admission.  Patient will  also benefit from the coordinated team approach during an Inpatient Acute Rehabilitation admission.  The patient will receive intensive therapy as well as Rehabilitation physician, nursing, social worker, and care management interventions.  Due to safety, skin/wound care, disease management, medical administration, pain management, patient education the patient requires 24 hour a day rehabilitation nursing.  The patient is currently Mod A x2 for mobility and Min/Mod A for basic ADLs.  Discharge setting and therapy post discharge at home with home health and some assistance from family is anticipated.  Patient has agreed to participate in the Acute Inpatient Rehabilitation Program and will admit today 03/22/18.  Preadmission Screen Completed By:  Jhonnie Garner, 03/22/2018 8:16 AM ______________________________________________________________________   Discussed status with Dr. Naaman Plummer on 03/21/18 at 4:00PM and received telephone approval for admission today, 03/22/18.   Admission Coordinator:  Jhonnie Garner, time 8:15AM/Date 03/22/18   Assessment/Plan: Diagnosis: right pontine infarct 1. Does the need for close, 24 hr/day  Medical supervision in concert with the patient's rehab needs make it unreasonable for this patient to be served in a less intensive setting? Yes 2. Co-Morbidities requiring supervision/potential complications: AKI, DM, drug abuse hx, post-stroke sequelae 3. Due to bladder management, bowel management, safety, skin/wound care, disease management, medication administration, pain management and patient education, does the patient require 24 hr/day rehab nursing? Yes 4. Does the patient require coordinated care of a physician, rehab nurse, PT (1-2 hrs/day, 5 days/week), OT (1-2 hrs/day, 5 days/week) and SLP (1-2 hrs/day, 5 days/week) to address physical and functional  deficits in the context of the above medical diagnosis(es)? Yes Addressing deficits in the following areas: balance, endurance, locomotion, strength, transferring, bowel/bladder control, bathing, dressing, feeding, grooming, toileting, cognition, speech, swallowing and psychosocial support 5. Can the patient actively participate in an intensive therapy program of at least 3 hrs of therapy 5 days a week? Yes 6. The potential for patient to make measurable gains while on inpatient rehab is excellent 7. Anticipated functional outcomes upon discharge from inpatients are: min assist PT, min assist OT, modified independent SLP 8. Estimated rehab length of stay to reach the above functional goals is: 13-16 days 9. Anticipated D/C setting: Home 10. Anticipated post D/C treatments: [place] therapy 11. Overall Rehab/Functional Prognosis: excellent    RECOMMENDATIONS: This patient's condition is appropriate for continued rehabilitative care in the following setting: CIR Patient has agreed to participate in recommended program. Yes Note that insurance prior authorization may be required for reimbursement for recommended care.  Comment:Admit to inpatient rehab today  Meredith Staggers, MD, [facility] 03/22/2018   Jhonnie Garner 03/22/2018        Revision History    Date/Time User Provider Type Action  03/22/2018 10:03 AM Meredith Staggers, MD Physician Sign  03/22/2018 9:00 AM Jhonnie Garner, OT Rehab Admission Coordinator Share  03/22/2018 8:58 AM Jhonnie Garner, OT Rehab Admission Coordinator Share  View Details Report

## 2018-03-22 NOTE — Progress Notes (Signed)
Report given to [facility] on 4W for inpatient rehab. Awaiting [facility]

## 2018-03-22 NOTE — Progress Notes (Signed)
Gave report to Cassie at [facility]. Called nurse on 4W, who reported charge said they are not able to accept at this moment however she took my call back info to let me know when they are ready.

## 2018-03-23 ENCOUNTER — Inpatient Hospital Stay (HOSPITAL_COMMUNITY): Payer: Self-pay

## 2018-03-23 ENCOUNTER — Inpatient Hospital Stay (HOSPITAL_COMMUNITY): Payer: Self-pay | Admitting: Physical Therapy

## 2018-03-23 ENCOUNTER — Inpatient Hospital Stay (HOSPITAL_COMMUNITY): Payer: Self-pay | Admitting: Occupational Therapy

## 2018-03-23 DIAGNOSIS — I69354 Hemiplegia and hemiparesis following cerebral infarction affecting left non-dominant side: Principal | ICD-10-CM

## 2018-03-23 MED ORDER — SENNOSIDES-DOCUSATE SODIUM 8.6-50 MG PO TABS
2.0000 | ORAL_TABLET | Freq: Two times a day (BID) | ORAL | Status: DC
  Administered 2018-03-23 – 2018-03-29 (×9): 2 via ORAL
  Filled 2018-03-23 (×11): qty 2

## 2018-03-23 NOTE — Evaluation (Signed)
Occupational Therapy Assessment and Plan  Patient Details  Name: Clinton Knight MRN: [id number] Date of Birth: 08-28-1960  OT Diagnosis: hemiplegia affecting non-dominant side and muscle weakness (generalized) Rehab Potential: Rehab Potential (ACUTE ONLY): Excellent ELOS: 7-10 days   Today's Date: 03/23/2018 OT Individual Time: 3500-9381 OT Individual Time Calculation (min): 73 min     Problem List:  Patient Active Problem List   Diagnosis Date Noted  . Right pontine stroke ([place]) 03/22/2018  . Sinus pause   . Stroke [facility]) 03/19/2018    Past Medical History:  Past Medical History:  Diagnosis Date  . Acute kidney insufficiency    03/2018: Cr 1.1 in setting of stroke (baseline ~0.9)  . Alcohol abuse   . Cocaine abuse ([place])    03/2018: Postitive cocaine UA tox screen  . Diabetes ([place])    03/20/18: A1C 6.4  . Hypertension   . Hypertriglyceridemia    03/20/18 TG 511  . Nicotine abuse   . Stroke [facility])    Past Surgical History:  Past Surgical History:  Procedure Laterality Date  . NO PAST SURGERIES      Assessment & Plan Clinical Impression: Clinton Knight. Staver is a 57 year old right-handed male with history of chronic renal insufficiency with creatinine 1.1, alcohol/tobacco and cocaine abuse, diabetes mellitus, hypertension. Per chart review patient independent prior to admission. Plan is to discharge home with sister and multiple family members that can provide assistance as needed. Patient on no prescription medications at time of admission. Presented 03/19/2018 to [facility] with leftside weakness, numbness and slurred speech. Cranial CT scan negative for acute findings noted remote bilateral basal ganglia and left cerebellar infarctions.Patient did not receive TPA.Urine drug screen positive cocaine. Troponin negative. MRI showed acute infarct in the right pons. T angiogram of head and neck with no acute ischemia, no emergent large vessel occlusion. Echocardiogram with  ejection fraction of 65% no wall motion abnormalities. Cardiology consulted for evaluation of sinus pauses noted on telemetry. No evidence of high-grade AV block noted. Advised to avoid any medications that could cause bradycardia no further work-up was indicated. Maintained on aspirin and Plavix for CVA prophylaxis. Subcutaneous Lovenox for DVT prophylaxis. Tolerating a regular diet. Therapy evaluations completed with recommendations of physical medicine rehab consult. Patient was admitted for a comprehensive rehab program.    Patient currently requires min with basic self-care skills secondary to muscle weakness, decreased cardiorespiratoy endurance, unbalanced muscle activation and decreased standing balance and hemiplegia.  Prior to hospitalization, patient could complete BADLs with independent .  Patient will benefit from skilled intervention to increase independence with basic self-care skills prior to discharge home with family.  Anticipate patient will require intermittent supervision and follow up home health.  OT - End of Session Endurance Deficit: Yes OT Assessment Rehab Potential (ACUTE ONLY): Excellent OT Barriers to Discharge: (N/A has accessible home environment + family support) OT Patient demonstrates impairments in the following area(s): Balance;Endurance;Motor;Safety OT Basic ADL's Functional Problem(s): Grooming;Bathing;Dressing;Toileting OT Advanced ADL's Functional Problem(s): Simple Meal Preparation OT Transfers Functional Problem(s): Toilet;Tub/Shower OT Additional Impairment(s): None OT Plan OT Intensity: Minimum of 1-2 x/day, 45 to 90 minutes OT Frequency: 5 out of 7 days OT Duration/Estimated Length of Stay: 7-10 days OT Treatment/Interventions: Balance/vestibular training;DME/adaptive equipment instruction;Patient/family education;Therapeutic Activities;Wheelchair propulsion/positioning;Cognitive remediation/compensation;Functional electrical  stimulation;Psychosocial support;Therapeutic Exercise;UE/LE Strength taining/ROM;Self Care/advanced ADL retraining;Functional mobility training;Community reintegration;Discharge planning;Neuromuscular re-education;UE/LE Coordination activities;Disease mangement/prevention;Pain management OT Self Feeding Anticipated Outcome(s): No goal OT Basic Self-Care Anticipated Outcome(s): Supervision/setup OT Toileting Anticipated Outcome(s): Mod I  OT  Bathroom Transfers Anticipated Outcome(s): Supervision/setup shower transfer, Mod I toilet transfer OT Recommendation Recommendations for Other Services: Therapeutic Recreation consult Therapeutic Recreation Interventions: Pet therapy;Outing/community reintergration Patient destination: Home Follow Up Recommendations: Home health OT Equipment Recommended: To be determined   Skilled Therapeutic Intervention Pt greeted in recliner with RN present to administer medication. No c/o pain. Skilled OT session completed with focus on initial evaluation, education on OT role/POC, and establishment of patient-centered goals. Stand pivot toilet and TTB transfers completed with steady assist and vcs for DME mgt. Bathing completed sit<stand with steady assist, with pt integrating L UE appropriately to wash Rt side. Afterwards dressing completed sit<stand w/c level at sink. Pt scanning to Lt>Rt side for needed items. Able to don Rt Ted hose with instruction and also tie shoelaces. He ambulated short distance with RW to EOB to don his shirt with supervision/setup. Pt then ambulated back to sink and completed oral care/grooming tasks while standing. Mild strength deficits noted with L UE, however able to meet FM demands of tasks assessed during eval. At end of session pt transferred back to bed and was left with all needs within reach and bed alarm set.   OT Evaluation Precautions/Restrictions  Precautions Precautions: Fall Restrictions Weight Bearing Restrictions:  No General Chart Reviewed: Yes Family/Caregiver Present: No Vital Signs Therapy Vitals Temp: 98 F (36.7 C) Temp Source: Oral Pulse Rate: 63 Resp: 16 BP: 127/72 Patient Position (if appropriate): Lying Oxygen Therapy SpO2: 100 % O2 Device: Room Air Pain No c/o pain during session    Home Living/Prior [place] expects to be discharged to:: Private residence Available Help at Discharge: Family(Has 6 siblings, will mostly have assist from 2 sisters) Type of Home: Apartment Home Access: Level entry Home Layout: One level Bathroom Shower/Tub: Optometrist: Yes  Lives With: Alone IADL History Current License: No Education: 12th grade  Occupation: Part time employment 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  Prior Function Level of Independence: Independent with basic ADLs, Independent with gait, Independent with transfers  Able to Take Stairs?: Yes Driving: Yes ADL ADL Eating: Not assessed Grooming: Contact guard Where Assessed-Grooming: Standing at sink Upper Body Bathing: Supervision/safety Where Assessed-Upper Body Bathing: Shower Lower Body Bathing: Contact guard Where Assessed-Lower Body Bathing: Shower Upper Body Dressing: Supervision/safety Where Assessed-Upper Body Dressing: Edge of bed Lower Body Dressing: Minimal assistance  Where Assessed-Lower Body Dressing: Wheelchair Toileting: Contact guard Where Assessed-Toileting: Glass blower/designer: Psychiatric nurse Method: Arts development officer: Energy manager: Environmental education officer Method: Radiographer, therapeutic: Radio broadcast assistant, Grab bars Vision Baseline Vision/History: Wears glasses Wears Glasses: Reading only Patient Visual Report: No change from baseline Vision Assessment?:  No apparent visual deficits(Able to read staff names on wall board and wall clock while sitting EOB) Perception  Perception: Within Functional Limits Praxis Praxis: Intact Cognition Overall Cognitive Status: Within Functional Limits for tasks assessed Arousal/Alertness: Awake/alert Orientation Level: Person;Place;Situation Person: Oriented Place: Oriented Situation: Oriented Year: 2019 Month: October Day of Week: Correct Memory: Appears intact Immediate Memory Recall: Sock;Blue;Bed Memory Recall: Sock;Blue;Bed Memory Recall Sock: With Cue Memory Recall Blue: Without Cue Memory Recall Bed: Without Cue Attention: Sustained Sustained Attention: Appears intact Awareness: Appears intact Problem Solving: Appears intact Safety/Judgment: Appears intact Sensation Sensation Light Touch: Appears Intact Hot/Cold: Appears Intact Proprioception: Appears Intact Stereognosis: Not tested Coordination Gross Motor Movements are Fluid and  Coordinated: No Fine Motor Movements are Fluid and Coordinated: Yes( Able to tie shoelaces and meet FM demands of ADL tasks assessed) Coordination and Movement Description: mild dysmetria on the L  Motor  Motor Motor: Hemiplegia Motor - Skilled Clinical Observations: Mild Lt sided weakness  Mobility  Transfers Sit to Stand: Minimal Assistance - Patient > 75% (during bathroom transfers) Trunk/Postural Assessment  Cervical Assessment Cervical Assessment: Within Functional Limits Thoracic Assessment Thoracic Assessment: Within Functional Limits Lumbar Assessment Lumbar Assessment: Within Functional Limits Postural Control Postural Control: Within Functional Limits  Balance Dynamic Sitting Balance Dynamic Sitting - Level of Assistance: 5: Stand by assistance Sitting balance - Comments: UB dressing EOB Dynamic Standing Balance Dynamic Standing - Level of Assistance: 4: Min assist(in shower with unilateral UE support on grab bar during  perihygiene) Extremity/Trunk Assessment RUE Assessment RUE Assessment: Within Functional Limits(5/5 proximal to distal) LUE Assessment LUE Assessment: Exceptions to WFL(4-/5 proximal to distal, 3+/5 triceps) LUE Body System: Neuro Brunstrum levels for arm and hand: Arm;Hand Brunstrum level for arm: Stage V Relative Independence from Synergy Brunstrum level for hand: Stage VI Isolated joint movements     Refer to Care Plan for Long Term Goals  Recommendations for other services: Therapeutic Recreation  Pet therapy and Outing/community reintegration   Discharge Criteria: Patient will be discharged from OT if patient refuses treatment 3 consecutive times without medical reason, if treatment goals not met, if there is a change in medical status, if patient makes no progress towards goals or if patient is discharged from hospital.  The above assessment, treatment plan, treatment alternatives and goals were discussed and mutually agreed upon: by patient  Skeet Simmer 03/23/2018, 3:58 PM

## 2018-03-23 NOTE — Plan of Care (Signed)
LTGs established 03/23/18 

## 2018-03-23 NOTE — Progress Notes (Signed)
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 Recent Labs    03/22/18 1515  WBC 11.0*  HGB 15.6  HCT 51.8  PLT 265   Recent Labs    03/21/18 0425 03/22/18 1515  NA 141  --   K 4.7  --   CL 108  --   CO2 27  --   GLUCOSE 104*  --   BUN 21*  --   CREATININE 1.20 1.28*  CALCIUM 8.9  --     Intake/Output Summary (Last 24 hours) at 03/23/2018 1035 Last data filed at 03/23/2018 0420 Gross per 24 hour  Intake 360 ml  Output 1300 ml  Net -940 ml     Physical Exam: Vital Signs Blood pressure 120/75, pulse 65, temperature 97.6 F (36.4 C), temperature source Oral, resp. rate 18, height 5\' 6"  (1.676 m), weight 87.7 kg, SpO2 99 %.     Assessment/Plan: 1. Functional deficits secondary to RIght pontine infarct which require 3+ hours per day of interdisciplinary therapy in a comprehensive inpatient rehab setting.  Physiatrist is providing close team supervision and 24 hour management of active medical problems listed below.  Physiatrist and rehab team continue to assess barriers to discharge/monitor patient progress toward functional and medical goals Medical Problem List and Plan: 1.Leftside weakness with dysarthriasecondary to acute infarct right pons -CIR PT, OT SLP evals  2. DVT Prophylaxis/Anticoagulation: Subcutaneous Lovenox. Monitor for any bleeding episodes 3. Pain Management:Tylenol as needed 4. Mood:Provide emotional support 5. Neuropsych: This patientiscapable of making decisions on hisown behalf. 6. Skin/Wound Care:Routine skin checks 7. Fluids/Electrolytes/Nutrition:Routine in and outs with follow-up chemistries -encourage PO 8.Hypertension. Lisinopril 5 mg daily. Monitor with increased mobility 9.Mobitz  type I. Asymptomatic. Follow-up outpatient cardiology 10.History of alcohol/tobacco and polysubstance abuse. Continue NicoDerm patch. Provide counseling 11.Hyperlipidemia. Fenofibrate/Lipitor 12.  Constipation- senna 2 po BID ordered Care Tool:  Bathing              Bathing assist       Upper Body Dressing/Undressing Upper body dressing        Upper body assist      Lower Body Dressing/Undressing Lower body dressing            Lower body assist       Toileting Toileting    Toileting assist Assist for toileting: Moderate Assistance - Patient 50 - 74%     Transfers Chair/bed transfer  Transfers assist           Locomotion Ambulation   Ambulation assist        Assistive device: Walker-rolling     Walk 10 feet activity   Assist           Walk 50 feet activity   Assist           Walk 150 feet activity   Assist           Walk 10 feet on uneven surface  activity   Assist       Assistive device: Photographer               Wheelchair 50 feet with 2 turns activity    Assist            Wheelchair  150 feet activity     Assist              LOS: 1 days A FACE TO FACE EVALUATION WAS PERFORMED  Erick Colace 03/23/2018, 10:35 AM

## 2018-03-23 NOTE — Evaluation (Signed)
Speech Language Pathology Assessment and Plan  Patient Details  Name: Clinton Knight MRN: [id number] Date of Birth: 1961-02-23  SLP Diagnosis: Cognitive Impairments;Dysarthria  Rehab Potential: Good ELOS: 14 days     Today's Date: 03/23/2018 SLP Individual Time: 7517-0017 SLP Individual Time Calculation (min): 58 min   Problem List:  Patient Active Problem List   Diagnosis Date Noted  . Right pontine stroke ([place]) 03/22/2018  . Sinus pause   . Stroke [facility]) 03/19/2018   Past Medical History:  Past Medical History:  Diagnosis Date  . Acute kidney insufficiency    03/2018: Cr 1.1 in setting of stroke (baseline ~0.9)  . Alcohol abuse   . Cocaine abuse ([place])    03/2018: Postitive cocaine UA tox screen  . Diabetes ([place])    03/20/18: A1C 6.4  . Hypertension   . Hypertriglyceridemia    03/20/18 TG 511  . Nicotine abuse   . Stroke [facility])    Past Surgical History:  Past Surgical History:  Procedure Laterality Date  . NO PAST SURGERIES      Assessment / Plan / Recommendation Clinical Impression Pt is a 57 yo M with history of HTN, DM2, alcohol abuse, history of cocaine use with most recent use 10/6, current tobacco abuse. He presented to [facility] with sudden onset of discomfort behind right eye and slurred speech as well as left sided weakness and sensory deficit. MRI revealed acute right pontine infarctwith remote basal ganglia and left cerebellar infarcts likely secondary to small vessel disease.Pt was seen by cardiology for evaluation of sinus pause in setting of code stroke. There was no evidence of high-grade AV block on baseline EKG with recommendations for sleep apnea screening. Pt continues to have left sided weakness and gait disturbance. PT and OT recommendations are for CIR. Pt is to [facility].  SLP assessed pt for high level cognitive linguistic skill set, noting ST signed off for basic cognition. Pt presents with mild cognitive  linguistic impairments, deficit include mildly complex problem solving, error awareness, selective attention and severe-moderate short term recall, supported by formal assessment Cognistat. Pt supports baseline memory deficits. Pt presents with very mild impairment in speech intelligibility at conversation level, hyponasal sound noted. Pt presents with normal swallow function on least restrictive diet, regular textures and thin lqiuids.Pt would benefit from skilled ST services in order to maximize functional independence and reduce burden of care.    Skilled Therapeutic Interventions          Skilled ST services focused on cognitive skills. SLP facilitated cognitive linguistic assessment and provided education of results. All questions were answered to satisfaction. Pt was left in room with call bell within reach and bed alarm set. Recommend to continue skilled ST services.   SLP Assessment  Patient will need skilled Speech Lanaguage Pathology Services during CIR admission    Recommendations  SLP Diet Recommendations: Thin Liquid Administration via: Straw;Cup Supervision: Patient able to self feed Postural Changes and/or Swallow Maneuvers: Seated upright 90 degrees Oral Care Recommendations: Oral care BID;Patient independent with oral care Patient destination: Home Follow up Recommendations: None Equipment Recommended: None recommended by SLP    SLP Frequency 3 to 5 out of 7 days   SLP Duration  SLP Intensity  SLP Treatment/Interventions 14 days   Minumum of 1-2 x/day, 30 to 90 minutes  Cognitive remediation/compensation;Cueing hierarchy;Medication managment    Pain Pain Assessment Pain Score: 0-No pain  Prior Functioning Cognitive/Linguistic Baseline: Within functional limits Type of  Home: Apartment  Lives With: Alone Available Help at Discharge: Family Education: 12th grade  Vocation: Unemployed(looking for a job)  Short Term Goals: Week 1: SLP Short Term Goal 1 (Week 1):  Pt will utilize speech intelligibility stratgeies in conversation with 90% intelligibility at Mod I.  SLP Short Term Goal 2 (Week 1): Pt will utilize compensatory strategies to recall new, complex daily information with Min A verbal cues. SLP Short Term Goal 3 (Week 1): Pt will demonstrate selective attention in moderately distracting environments in 30 minute intervals with supervision A verbal cues. SLP Short Term Goal 4 (Week 1): Pt will demonstrate functional problem solving for mildly complex tasks with supervision A verbal cues. SLP Short Term Goal 5 (Week 1): Pt will self-monitor and self-correct functional errors during problem solving tasks with supervision A verbal cues.   Refer to Care Plan for Long Term Goals  Recommendations for other services: None   Discharge Criteria: Patient will be discharged from SLP if patient refuses treatment 3 consecutive times without medical reason, if treatment goals not met, if there is a change in medical status, if patient makes no progress towards goals or if patient is discharged from hospital.  The above assessment, treatment plan, treatment alternatives and goals were discussed and mutually agreed upon: by patient  Clinton Knight  [facility] 03/23/2018, 4:16 PM

## 2018-03-23 NOTE — Evaluation (Signed)
Physical Therapy Assessment and Plan  Patient Details  Name: Clinton Knight MRN: [id number] Date of Birth: 21-Sep-1960  PT Diagnosis: Abnormality of gait, Ataxia, Hemiplegia non-dominant and Muscle weakness Rehab Potential: Good ELOS: 7-10 days    Today's Date: 03/23/2018 PT Individual Time: 1100-1200 PT Individual Time Calculation (min): 60 min    Problem List:  Patient Active Problem List   Diagnosis Date Noted  . 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 Sinus pause   . Stroke [facility]) 03/19/2018    Past Medical History:  Past Medical History:  Diagnosis Date  . Acute kidney insufficiency    03/2018: Cr 1.1 in setting of stroke (baseline ~0.9)  . Alcohol abuse   . Cocaine abuse ([place])    03/2018: Postitive cocaine UA tox screen  . Diabetes ([place])    03/20/18: A1C 6.4  . Hypertension   . Hypertriglyceridemia    03/20/18 TG 511  . Nicotine abuse   . Stroke [facility])    Past Surgical History:  Past Surgical History:  Procedure Laterality Date  . NO PAST SURGERIES      Assessment & Plan Clinical Impression: Patient is a 57 year old right-handed male with history of chronic renal insufficiency with creatinine 1.1, alcohol/tobacco and cocaine abuse, diabetes mellitus, hypertension. Per chart review patient independent prior to admission. Plan is to discharge home with sister and multiple family members that can provide assistance as needed. Patient on no prescription medications at time of admission. Presented 03/19/2018 to [facility] with leftside weakness, numbness and slurred speech. Cranial CT scan negative for acute findings noted remote bilateral basal ganglia and left cerebellar infarctions.Patient did not receive TPA.Urine drug screen positive cocaine. Troponin negative. MRI showed acute infarct in the right pons. T angiogram of head and neck with no acute ischemia, no emergent large vessel occlusion. Echocardiogram with ejection fraction of 65% no wall motion abnormalities.  Cardiology consulted for evaluation of sinus pauses noted on telemetry. No evidence of high-grade AV block noted. Advised to avoid any medications that could cause bradycardia no further work-up was indicated. Maintained on aspirin and Plavix for CVA prophylaxis.  Patient transferred to CIR on 03/22/2018 .   Patient currently requires mod with mobility secondary to muscle weakness, decreased cardiorespiratoy endurance, impaired timing and sequencing, unbalanced muscle activation and decreased coordination and decreased standing balance, decreased postural control, hemiplegia and decreased balance strategies.  Prior to hospitalization, patient was independent  with mobility and lived with Alone in a [place] home.  Home access is  Level entry.  Patient will benefit from skilled PT intervention to maximize safe functional mobility, minimize fall risk and decrease caregiver burden for planned discharge home with intermittent assist.  Anticipate patient will benefit from follow up [facility] at discharge.  PT - End of Session Activity Tolerance: Tolerates 10 - 20 min activity with multiple rests Endurance Deficit: Yes PT Assessment Rehab Potential (ACUTE/IP ONLY): Good PT Barriers to Discharge: Decreased caregiver support;Home environment access/layout PT Patient demonstrates impairments in the following area(s): Balance;Endurance;Motor;Safety PT Transfers Functional Problem(s): Bed Mobility;Bed to Chair;Car;Furniture;Floor PT Locomotion Functional Problem(s): Ambulation;Wheelchair Mobility;Stairs PT Plan PT Intensity: Minimum of 1-2 x/day ,45 to 90 minutes PT Frequency: 5 out of 7 days PT Duration Estimated Length of Stay: 7-10 days  PT Treatment/Interventions: Ambulation/gait training;Balance/vestibular training;Disease management/prevention;Discharge planning;Community reintegration;Functional electrical stimulation;Neuromuscular re-education;Patient/family education;Skin care/wound management;Stair  training;UE/LE Coordination activities;Therapeutic Exercise;Wheelchair propulsion/positioning;Visual/perceptual remediation/compensation;Therapeutic Activities;UE/LE Strength taining/ROM;Splinting/orthotics;Psychosocial support;Pain management;Functional mobility training;DME/adaptive equipment instruction PT Transfers Anticipated Outcome(s): Mod I with LRAD  PT Locomotion Anticipated Outcome(s):  Supervision assist from PT  PT Recommendation Recommendations for Other Services: Neuropsych consult;Therapeutic Recreation consult Therapeutic Recreation Interventions: Kitchen group;Outing/community reintergration Follow Up Recommendations: Home health PT Patient destination: Home Equipment Recommended: Rolling walker with 5" wheels;Cane  Skilled Therapeutic Intervention Pt received sitting in [facility] and agreeable to PT. PT instructed patient in PT Evaluation and initiated treatment intervention; see below for results. PT educated patient in [place], rehab potential, rehab goals, and discharge recommendations. Additional gait training with RW x 140f with min assist overall from PT with cues for improved step height and weight shifting to improve foot clearance in the L Patient returned to room and [facility] reach and all needs met.       PT Evaluation Precautions/Restrictions   L hemiplegia  General   Vital Signs  BP 137/74: sitting. 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 [place]  [place] Intact  Cognition Overall Cognitive Status: Within Functional Limits for tasks assessed Orientation Level: Oriented X4 Sensation Sensation Light Touch: Appears Intact Coordination Gross Motor Movements are Fluid and Coordinated: No Fine Motor Movements are Fluid and Coordinated: No Coordination and Movement Description: mild dysmetria on the L  Finger Nose Finger Test: decreased speed and accuracy on the L   Heel Shin Test: decreased speed  Motor  Motor Motor: Hemiplegia Motor - Skilled Clinical Observations: L hemiplegia   Mobility Bed Mobility Bed Mobility: Rolling Right;Rolling Left;Sit to Supine;Supine to Sit Rolling Right: Supervision/verbal cueing Rolling Left: Supervision/Verbal cueing Supine to Sit: Supervision/Verbal cueing Sit to Supine: Supervision/Verbal cueing Transfers Transfers: Sit to Stand;Stand Pivot Transfers Sit to Stand: Supervision/Verbal cueing Stand Pivot Transfers: Minimal Assistance - Patient > 75% Transfer (Assistive device): None  Car transfer: Min assist without AD.  Locomotion  Gait Ambulation: Yes Gait Assistance: Moderate Assistance - Patient 50-74% Gait Distance (Feet): 75 Feet Assistive device: None Gait Assistance Details: Manual facilitation for weight shifting;Verbal cues for gait pattern;Verbal cues for sequencing;Visual cues/gestures for sequencing;Visual cues/gestures for precautions/safety Gait Gait: Yes Gait Pattern: Ataxic;Lateral hip instability;Lateral trunk lean to left Stairs / Additional Locomotion Stairs: Yes Stairs Assistance: Minimal Assistance - Patient > 75% Stair Management Technique: Two rails Number of Stairs: 12 Height of Stairs: 6 Wheelchair Mobility Wheelchair Mobility: Yes Wheelchair Assistance: Minimal assistance - Patient >75% Wheelchair Propulsion: Both upper extremities Wheelchair Parts Management: Needs assistance Distance: 1549f   Trunk/Postural Assessment   Cervical Assessment Cervical Assessment: Within Functional Limits Thoracic Assessment Thoracic Assessment: Within Functional Limits Lumbar Assessment Lumbar Assessment: Within Functional Limits Postural Control Postural Control: Deficits on evaluation(mild trunkal ataxia with gait with L lateral lean )  Balance Balance Balance Assessed: Yes Dynamic Sitting Balance Dynamic Sitting - Level of Assistance: 5: Stand by assistance Static Standing Balance Static Standing - Level of Assistance: 4: Min assist Dynamic Standing Balance Dynamic Standing - Level of Assistance: 3: Mod assist Extremity Assessment      RLE Assessment RLE Assessment: Within Functional Limits LLE Assessment LLE Assessment: Exceptions to [facility] Strength Comments: grossly 4/5 proximal to distal     Refer to Care Plan for  Long Term Goals  Recommendations for other services: Neuropsych and Surveyor, mining group, Stress management and Outing/community reintegration  Discharge Criteria: Patient will be discharged from PT if patient refuses treatment 3 consecutive times without medical reason, if treatment goals not met, if there is a change in medical status, if patient makes no progress towards goals or if patient is discharged from hospital.  The above assessment, treatment plan, treatment alternatives and goals were discussed and mutually agreed upon: by patient  Lorie Phenix 03/23/2018, 1:23 PM

## 2018-03-24 ENCOUNTER — Inpatient Hospital Stay (HOSPITAL_COMMUNITY): Payer: Self-pay | Admitting: Physical Therapy

## 2018-03-24 ENCOUNTER — Inpatient Hospital Stay (HOSPITAL_COMMUNITY): Payer: Self-pay | Admitting: Occupational Therapy

## 2018-03-24 NOTE — Progress Notes (Signed)
Physical Therapy Session Note  Patient Details  Name: Clinton Knight MRN: [id number] Date of Birth: [birth year]  Today's Date: 03/24/2018 PT Individual Time: 1300-1330 PT Individual Time Calculation (min): 30 min   Short Term Goals: Week 1:  PT Short Term Goal 1 (Week 1): STG=LTG due to ELOS  Skilled Therapeutic Interventions/Progress Updates:    Pt received laying in bed; agreeable to therapy. ModI bed mobility (supine>roll R, sidelying>sitting EOB). SPT applied ted hose underneath pt grip socks. Sit>stand with RW and modI. Ambulation to rehab gym ~255ft with RW and S. Standing balance activities on airex without UE support: 1) combination of vertical and horizontal head turns, 2) reaching out of BOS (L, R, above, below, and forward) with basketball, and 3) ball tosses with increased speed and unpredictability outside of BOS. Pt tends to bear more weight through the RLE due to weakness in LLE. Strengthening exercises of the LLE were implemented including 1) CGA sit>stand with raised mat table, no UE support, and R foot placed anteriorly for focus on LLE weightbearing and muscle facilitation; tactile cues to prevent knee hyperextension; 2) L step ups on 6 inch steps with BUE support; verbal/tactile cues for quad and glute to promote upright posture with step up. S ambulation w/ RW ~217ft for return to room; pt required verbal cues/instruction to keep RW close to body to prevent forwardly flexed posture. Pt performed toileting in standing and hygiene with S. Pt remained sitting in recliner at the end of the session with all needs in reach.    Therapy Documentation Precautions:  Precautions Precautions: Fall Restrictions Weight Bearing Restrictions: No Pain: Pain Assessment Pain Score: 2     Therapy/Group: Individual Therapy  [place] Ollivander See 03/24/2018, 1:46 PM

## 2018-03-24 NOTE — Progress Notes (Signed)
Occupational Therapy Session Note  Patient Details  Name: Clinton Knight MRN: [id number] Date of Birth: [birth year]  Today's Date: 03/24/2018 OT Individual Time: 2130-8657 OT Individual Time Calculation (min): 33 min   Short Term Goals: Week 1:  OT Short Term Goal 1 (Week 1): STGs=LTGs due to ELOS  Skilled Therapeutic Interventions/Progress Updates:    Pt greeted in recliner with no c/o pain. ADL needs met. He ambulated to dayroom with steady assist and RW, cues provided for staying inside of device. Once in dayroom, pt engaged in Wii baseball and boxing activities in sitting and standing positions. Tx focus on dynamic balance and Lt strengthening. Pt bilaterally integrated Lt to swing baseball bat and also to punch with Lt boxing glove. Pt with increased Lt weightshifting/weightbearing after all baseball bat swings. He initially had difficultly producing forceful arm swings with Lt. This improved with repetition and cuing, as evidenced by higher scores on game. Steady assist provided for all dynamic standing. At end of session pt ambulated back to room in manner as written above, transferred to toilet with steady assist, and then returned to recliner. Pt left with safety belt fastened and all needs.    Therapy Documentation Precautions:  Precautions Precautions: Fall Restrictions Weight Bearing Restrictions: No Vital Signs: Therapy Vitals Temp: 98.4 F (36.9 C) Temp Source: Oral Pulse Rate: 77 Resp: 18 BP: 113/70 Patient Position (if appropriate): Sitting Oxygen Therapy SpO2: 100 % O2 Device: Room Air Pain: No c/o pain during session    ADL: ADL Eating: Not assessed Grooming: Contact guard Where Assessed-Grooming: Standing at sink Upper Body Bathing: Supervision/safety Where Assessed-Upper Body Bathing: Shower Lower Body Bathing: Contact guard Where Assessed-Lower Body Bathing: Shower Upper Body Dressing: Supervision/safety Where Assessed-Upper Body Dressing: Edge of  bed Lower Body Dressing: Minimal assistance Where Assessed-Lower Body Dressing: Wheelchair Toileting: Contact guard Where Assessed-Toileting: Glass blower/designer: Psychiatric nurse Method: Arts development officer: Energy manager: Environmental education officer Method: Radiographer, therapeutic: Radio broadcast assistant, Grab bars      Therapy/Group: Individual Therapy  Llewellyn Schoenberger A Leonidus Rowand 03/24/2018, 4:47 PM

## 2018-03-24 NOTE — Progress Notes (Signed)
[facility] PHYSICAL MEDICINE & REHABILITATION PROGRESS NOTE   Subjective/Complaints:  Left shoulder pain, slept on L side, discussed this with pt  ROS- denies CP, SOB, N/V/D  Objective:   No results found. Recent Labs    03/22/18 1515  WBC 11.0*  HGB 15.6  HCT 51.8  PLT 265   Recent Labs    03/22/18 1515  CREATININE 1.28*    Intake/Output Summary (Last 24 hours) at 03/24/2018 0935 Last data filed at 03/24/2018 0800 Gross per 24 hour  Intake 720 ml  Output 1050 ml  Net -330 ml     Physical Exam: Vital Signs Blood pressure 115/88, pulse 81, temperature 97.6 F (36.4 C), temperature source Oral, resp. rate 20, height 5\' 6"  (1.676 m), weight 87.7 kg, SpO2 99 %.  General: No acute distress Mood and affect are appropriate Heart: Regular rate and rhythm no rubs murmurs or extra sounds Lungs: Clear to auscultation, breathing unlabored, no rales or wheezes Abdomen: Positive bowel sounds, soft nontender to palpation, nondistended Extremities: No clubbing, cyanosis, or edema Skin: No evidence of breakdown, no evidence of rash Neurologic: Cranial nerves II through XII intact, motor strength is 5/5 in RIght  deltoid, bicep, tricep, grip, hip flexor, knee extensors, ankle dorsiflexor and plantar flexor, 4/5 on left side  Musculoskeletal: Full range of motion in all 4 extremities. No joint swelling, no pain with Left shoulder abd    Assessment/Plan: 1. Functional deficits secondary to RIght pontine infarct which require 3+ hours per day of interdisciplinary therapy in a comprehensive inpatient rehab setting.  Physiatrist is providing close team supervision and 24 hour management of active medical problems listed below.  Physiatrist and rehab team continue to assess barriers to discharge/monitor patient progress toward functional and medical goals Medical Problem List and Plan: 1.Leftside weakness with dysarthriasecondary to acute infarct right pons -CIR  PT, OT SLP evals  2. DVT Prophylaxis/Anticoagulation: Subcutaneous Lovenox. Monitor for any bleeding episodes 3. Pain Management:Tylenol as needed, left shoulder pain, positioning on right side recommended 4. Mood:Provide emotional support 5. Neuropsych: This patientiscapable of making decisions on hisown behalf. 6. Skin/Wound Care:Routine skin checks 7. Fluids/Electrolytes/Nutrition:Routine in and outs with follow-up chemistries -encourage PO 8.Hypertension. Lisinopril 5 mg daily. Monitor with increased mobility Controlled 10/13 Vitals:   03/23/18 2033 03/24/18 0410  BP: 121/78 115/88  Pulse: 72 81  Resp: 16 20  Temp: 98.4 F (36.9 C) 97.6 F (36.4 C)  SpO2: 100% 99%  9.Mobitz type I. Asymptomatic. Follow-up outpatient cardiology 10.History of alcohol/tobacco and polysubstance abuse. Continue NicoDerm patch. Provide counseling 11.Hyperlipidemia. Fenofibrate/Lipitor 12.  Constipation- senna 2 po BID ordered 13.  Leukocytosis- no fever will repeat CBC and diff on Monday Care Tool:  Bathing    Body parts bathed by patient: Right arm, Left arm, Chest, Abdomen, Front perineal area, Buttocks, Right upper leg, Left upper leg, Right lower leg, Left lower leg, Face         Bathing assist Assist Level: Contact Guard/Touching assist     Upper Body Dressing/Undressing Upper body dressing   What is the patient wearing?: Pull over shirt    Upper body assist Assist Level: Supervision/Verbal cueing    Lower Body Dressing/Undressing Lower body dressing      What is the patient wearing?: Pants     Lower body assist Assist for lower body dressing: Contact Guard/Touching assist     Toileting Toileting    Toileting assist Assist for toileting: Supervision/Verbal cueing     Transfers Chair/bed transfer  Transfers  assist     Chair/bed transfer assist level: Contact Guard/Touching assist     Locomotion Ambulation   Ambulation  assist      Assist level: Moderate Assistance - Patient 50 - 74% Assistive device: Walker-rolling Max distance: 75   Walk 10 feet activity   Assist     Assist level: Moderate Assistance - Patient - 50 - 74% Assistive device: Hand held assist   Walk 50 feet activity   Assist    Assist level: Moderate Assistance - Patient - 50 - 74%      Walk 150 feet activity   Assist Walk 150 feet activity did not occur: Safety/medical concerns         Walk 10 feet on uneven surface  activity   Assist     Assist level: Moderate Assistance - Patient - 50 - 74% Assistive device: Photographer   Type of Wheelchair: Power    Wheelchair assist level: Minimal Assistance - Patient > 75% Max wheelchair distance: 117ft    Wheelchair 50 feet with 2 turns activity    Assist        Assist Level: Minimal Assistance - Patient > 75%   Wheelchair 150 feet activity     Assist     Assist Level: Minimal Assistance - Patient > 75%        LOS: 2 days A FACE TO FACE EVALUATION WAS PERFORMED  Erick Colace 03/24/2018, 9:35 AM

## 2018-03-25 ENCOUNTER — Inpatient Hospital Stay (HOSPITAL_COMMUNITY): Payer: Self-pay

## 2018-03-25 ENCOUNTER — Inpatient Hospital Stay (HOSPITAL_COMMUNITY): Payer: Self-pay | Admitting: Speech Pathology

## 2018-03-25 ENCOUNTER — Inpatient Hospital Stay (HOSPITAL_COMMUNITY): Payer: Self-pay | Admitting: Occupational Therapy

## 2018-03-25 DIAGNOSIS — E669 Obesity, unspecified: Secondary | ICD-10-CM

## 2018-03-25 DIAGNOSIS — I10 Essential (primary) hypertension: Secondary | ICD-10-CM

## 2018-03-25 DIAGNOSIS — K5901 Slow transit constipation: Secondary | ICD-10-CM

## 2018-03-25 DIAGNOSIS — D72829 Elevated white blood cell count, unspecified: Secondary | ICD-10-CM

## 2018-03-25 DIAGNOSIS — E1169 Type 2 diabetes mellitus with other specified complication: Secondary | ICD-10-CM

## 2018-03-25 LAB — CBC WITH DIFFERENTIAL/PLATELET
Abs Immature Granulocytes: 0.04 10*3/uL (ref 0.00–0.07)
Basophils Absolute: 0.1 10*3/uL (ref 0.0–0.1)
Basophils Relative: 0 %
EOS ABS: 0.3 10*3/uL (ref 0.0–0.5)
EOS PCT: 3 %
HCT: 46.6 % (ref 39.0–52.0)
Hemoglobin: 14.4 g/dL (ref 13.0–17.0)
IMMATURE GRANULOCYTES: 0 %
LYMPHS ABS: 3.2 10*3/uL (ref 0.7–4.0)
Lymphocytes Relative: 27 %
MCH: 28 pg (ref 26.0–34.0)
MCHC: 30.9 g/dL (ref 30.0–36.0)
MCV: 90.5 fL (ref 80.0–100.0)
Monocytes Absolute: 1 10*3/uL (ref 0.1–1.0)
Monocytes Relative: 9 %
NEUTROS PCT: 61 %
NRBC: 0 % (ref 0.0–0.2)
Neutro Abs: 7 10*3/uL (ref 1.7–7.7)
PLATELETS: 253 10*3/uL (ref 150–400)
RBC: 5.15 MIL/uL (ref 4.22–5.81)
RDW: 13.8 % (ref 11.5–15.5)
WBC: 11.5 10*3/uL — AB (ref 4.0–10.5)

## 2018-03-25 LAB — COMPREHENSIVE METABOLIC PANEL
ALBUMIN: 3.6 g/dL (ref 3.5–5.0)
ALT: 30 U/L (ref 0–44)
ANION GAP: 8 (ref 5–15)
AST: 20 U/L (ref 15–41)
Alkaline Phosphatase: 72 U/L (ref 38–126)
BUN: 13 mg/dL (ref 6–20)
CHLORIDE: 106 mmol/L (ref 98–111)
CO2: 23 mmol/L (ref 22–32)
Calcium: 9 mg/dL (ref 8.9–10.3)
Creatinine, Ser: 1.13 mg/dL (ref 0.61–1.24)
GFR calc Af Amer: >60 mL/min (ref >60)
GFR calc non Af Amer: >60 mL/min (ref >60)
GLUCOSE: 172 mg/dL — AB (ref 70–99)
POTASSIUM: 4.3 mmol/L (ref 3.5–5.1)
SODIUM: 137 mmol/L (ref 135–145)
TOTAL PROTEIN: 7 g/dL (ref 6.5–8.1)
Total Bilirubin: 0.3 mg/dL (ref 0.3–1.2)

## 2018-03-25 NOTE — Progress Notes (Signed)
Occupational Therapy Session Note  Patient Details  Name: Clinton Knight MRN: [id number] Date of Birth: 05/02/1961  Today's Date: 03/25/2018 OT Individual Time: 0800-0902 OT Individual Time Calculation (min): 62 min    Short Term Goals: Week 1:  OT Short Term Goal 1 (Week 1): STGs=LTGs due to ELOS  Skilled Therapeutic Interventions/Progress Updates:    Pt presents supine in bed, agreeable to OT treatment session. Pt ambulates throughout session using RW at overall S level, CGA provided when obtaining clothing items from drawers. Pt bathes at shower level with S and use of grab bars during standing portion of tasks. Pt initiates donning underwear in standing, performing with CGA though education provided on increased safety with completing LB dressing via sit<>stand vs standing only. Pt dons pants seated on edge of TTB, completing with S. Pt returns to sitting EOB to don footwear (including TEDs). Performs all grooming ADLs standing at sink using single UE support, standing >5 min to complete tasks and to place meal orders for the day. Pt then ambulated to therapy gym using RW with S. Performed seated tabletop activity with focus on increasing LUE FM strength/coordination using clothespins and use of lateral pinch grasp when placing clothespins. Pt returned to room in manner described above where pt was left supine in bed, bed alarm set, call bell and needs within reach. Pt with reports of pain in L shoulder this session due to recently having flu shot, provided heat pack end of session to address.   Therapy Documentation Precautions:  Precautions Precautions: Fall Restrictions Weight Bearing Restrictions: No   Therapy/Group: Individual Therapy  Orlando Penner 03/25/2018, 7:46 AM

## 2018-03-25 NOTE — Progress Notes (Signed)
[facility] Individual Statement of Services  Patient Name:  Clinton Knight  Date:  03/25/2018  Welcome to the [facility]patient [facility].  Our goal is to provide you with an individualized program based on your diagnosis and situation, designed to meet your specific needs.  With this comprehensive rehabilitation program, you will be expected to participate in at least 3 hours of rehabilitation therapies Monday-Friday, with modified therapy programming on the weekends.  Your rehabilitation program will include the following services:  Physical Therapy (PT), Occupational Therapy (OT), Speech Therapy (ST), 24 hour per day rehabilitation nursing, Case Management (Social Worker), Rehabilitation Medicine, Nutrition Services and Pharmacy Services  Weekly team conferences will be held on Wednesdays to discuss your progress.  Your Social Worker will talk with you frequently to get your input and to update you on team discussions.  Team conferences with you and your family in attendance may also be held.  Expected length of stay:  7 to 10 days  Overall anticipated outcome:  Supervision with walking and stairs; independent with assistive device for all other tasks  Depending on your progress and recovery, your program may change. Your Social Worker will coordinate services and will keep you informed of any changes. Your Social Worker's name and contact numbers are listed  below.  The following services may also be recommended but are not provided by the Inpatient [facility]:   Driving Evaluations  Home Health Rehabiltiation Services  Outpatient Rehabilitation Services  Vocational Rehabilitation   Arrangements will be made to provide these services after discharge if needed.  Arrangements include referral to agencies that provide these services.  Your insurance has been verified to be:  You do not currently have insurance.  Financial counselor is working with you  on resources. Your primary doctor is:  You do not have a primary care provider right now.  Boneta Lucks will give you information for community clinic.  Pertinent information will be shared with your doctor and your insurance company.  Social Worker:  Staci Acosta, LCSW  [phone] or [phone]  Information discussed with and copy given to patient by: Elvera Lennox, 03/25/2018, 1:08 PM

## 2018-03-25 NOTE — Progress Notes (Signed)
Speech Language Pathology Daily Session Note  Patient Details  Name: Clinton Knight MRN: [id number] Date of Birth: 04-21-1961  Today's Date: 03/25/2018 SLP Individual Time: 1400-1455 SLP Individual Time Calculation (min): 55 min  Short Term Goals: Week 1: SLP Short Term Goal 1 (Week 1): Pt will utilize speech intelligibility stratgeies in conversation with 90% intelligibility at Mod I.  SLP Short Term Goal 2 (Week 1): Pt will utilize compensatory strategies to recall new, complex daily information with Min A verbal cues. SLP Short Term Goal 3 (Week 1): Pt will demonstrate selective attention in moderately distracting environments in 30 minute intervals with supervision A verbal cues. SLP Short Term Goal 4 (Week 1): Pt will demonstrate functional problem solving for mildly complex tasks with supervision A verbal cues. SLP Short Term Goal 5 (Week 1): Pt will self-monitor and self-correct functional errors during problem solving tasks with supervision A verbal cues.   Skilled Therapeutic Interventions: Skilled treatment session focused on cognitive goals. SLP facilitated session by providing Mod A verbal cues for recall of his current medications and their functions. However, patient was overall Mod I for organization of a BID pill box. SLP also facilitated session by providing extra time and supervision verbal cues for thoroughness during a calendar organization task. Patient was also Mod I for basic money management tasks. Patient left upright in recliner with alarm on and all needs within reach. Continue with current plan of care.      Pain No/Denies Pain   Therapy/Group: Individual Therapy  Aunesty Tyson 03/25/2018, 2:59 PM

## 2018-03-25 NOTE — IPOC Note (Addendum)
Overall Plan of Care [facility]) Patient Details Name: Clinton Knight MRN: [id number] DOB: August 18, 1960  Admitting Diagnosis: Right pontine stroke  Hospital Problems: Active Problems:   Right pontine stroke (HCC)   Diabetes mellitus type 2 in obese (HCC)   Leukocytosis   Slow transit constipation   Essential hypertension     Functional Problem List: Nursing Endurance, Medication Management, Motor, Safety  PT Balance, Endurance, Motor, Safety  OT Balance, Endurance, Motor, Safety  SLP Cognition  TR         Basic ADL's: OT Grooming, Bathing, Dressing, Toileting     Advanced  ADL's: OT Simple Meal Preparation     Transfers: PT Bed Mobility, Bed to Chair, Car, Furniture, Floor  OT Toilet, Research scientist (life sciences): PT Ambulation, Psychologist, prison and probation services, Stairs     Additional Impairments: OT None  SLP Communication, Social Cognition expression Awareness, Attention, Memory, Problem Solving  TR      Anticipated Outcomes Item Anticipated Outcome  Self Feeding No goal  Swallowing      Basic self-care  Supervision/setup  Toileting  Mod I    Bathroom Transfers Supervision/setup shower transfer, Mod I toilet transfer  Bowel/Bladder  Mod I assist  Transfers  Mod I with LRAD   Locomotion  Supervision assist from PT   Communication  Mod I  Cognition  Supervision - Mod I  Pain  < 3  Safety/Judgment  Mod I assist   Therapy Plan: PT Intensity: Minimum of 1-2 x/day ,45 to 90 minutes PT Frequency: 5 out of 7 days PT Duration Estimated Length of Stay: 7-10 days  OT Intensity: Minimum of 1-2 x/day, 45 to 90 minutes OT Frequency: 5 out of 7 days OT Duration/Estimated Length of Stay: 7-10 days SLP Intensity: Minumum of 1-2 x/day, 30 to 90 minutes SLP Frequency: 3 to 5 out of 7 days SLP Duration/Estimated Length of Stay: 14 days     Team Interventions: Nursing Interventions Patient/Family Education, Medication Management, Disease Management/Prevention, Discharge  Planning, Dysphagia/Aspiration Precaution Training, Cognitive Remediation/Compensation, Psychosocial Support  PT interventions Ambulation/gait training, Warden/ranger, Disease management/prevention, Discharge planning, Community reintegration, Functional electrical stimulation, Neuromuscular re-education, Patient/family education, Skin care/wound management, Stair training, UE/LE Coordination activities, Therapeutic Exercise, Wheelchair propulsion/positioning, Visual/perceptual remediation/compensation, Therapeutic Activities, UE/LE Strength taining/ROM, Splinting/orthotics, Psychosocial support, Pain management, Functional mobility training, DME/adaptive equipment instruction  OT Interventions Warden/ranger, DME/adaptive equipment instruction, Patient/family education, Therapeutic Activities, Wheelchair propulsion/positioning, Cognitive remediation/compensation, Functional electrical stimulation, Psychosocial support, Therapeutic Exercise, UE/LE Strength taining/ROM, Self Care/advanced ADL retraining, Functional mobility training, Community reintegration, Discharge planning, Neuromuscular re-education, UE/LE Coordination activities, Disease mangement/prevention, Pain management  SLP Interventions Cognitive remediation/compensation, Cueing hierarchy, Medication managment  TR Interventions    SW/CM Interventions Discharge Planning, Psychosocial Support, Patient/Family Education   Barriers to Discharge MD  Medical stability  Nursing Medication compliance no insurance, lives alone  PT Decreased caregiver support, Home environment access/layout    OT (N/A has accessible home environment + family support)    SLP      SW       Team Discharge Planning: Destination: PT-Home ,OT- Home , SLP-Home Projected Follow-up: PT-Home health PT, OT-  Home health OT, SLP-None Projected Equipment Needs: PT-Rolling walker with 5" wheels, [place], OT- To be determined, SLP-None recommended by  SLP Equipment Details: PT- , OT-  Patient/family involved in discharge planning: PT- Patient,  OT-Patient, SLP-Patient  MD ELOS: 7-10 days. Medical Rehab Prognosis:  Good Assessment: 57 year old right-handed male with history of chronic renal insufficiency with creatinine 1.1, alcohol/tobacco  and cocaine abuse, diabetes mellitus, hypertension. Presented 03/19/2018 to [facility] with leftside weakness, numbness and slurred speech. Cranial CT scan negative for acute findings noted remote bilateral basal ganglia and left cerebellar infarctions.Patient did not receive TPA.Urine drug screen positive cocaine. Troponin negative. MRI showed acute infarct in the right pons. T angiogram of head and neck with no acute ischemia, no emergent large vessel occlusion. Echocardiogram with ejection fraction of 65% no wall motion abnormalities. Cardiology consulted for evaluation of sinus pauses noted on telemetry. No evidence of high-grade AV block noted. Advised to avoid any medications that could cause bradycardia no further work-up was indicated. Maintained on aspirin and Plavix for CVA prophylaxis. Patient with resulting functional deficits with mobility, self-care, transfers, balance.  Will set goals for supervision/Mod I with PT/OT/SLP.  See Team Conference Notes for weekly updates to the plan of care

## 2018-03-25 NOTE — Progress Notes (Signed)
Clinical Instructor cosign

## 2018-03-25 NOTE — Progress Notes (Addendum)
Clinton Knight PHYSICAL MEDICINE & REHABILITATION PROGRESS NOTE   Subjective/Complaints: Patient seen sitting up at the edge of his bed this morning.  He states he slept well overnight.  He complains of left shoulder pain after his vaccines.  ROS: + Left shoulder pain.  Denies CP, SOB, N/V/D  Objective:   No results found. Recent Labs    03/22/18 1515 03/25/18 0551  WBC 11.0* 11.5*  HGB 15.6 14.4  HCT 51.8 46.6  PLT 265 253   Recent Labs    03/22/18 1515 03/25/18 0551  NA  --  137  K  --  4.3  CL  --  106  CO2  --  23  GLUCOSE  --  172*  BUN  --  13  CREATININE 1.28* 1.13  CALCIUM  --  9.0    Intake/Output Summary (Last 24 hours) at 03/25/2018 1007 Last data filed at 03/25/2018 0814 Gross per 24 hour  Intake 720 ml  Output 1125 ml  Net -405 ml     Physical Exam: Vital Signs Blood pressure 119/79, pulse 75, temperature 98.3 F (36.8 C), resp. rate 18, height 5\' 6"  (1.676 m), weight 87.7 kg, SpO2 98 %.  Constitutional: No distress . Vital signs reviewed. HENT: Normocephalic.  Atraumatic. Eyes: EOMI. No discharge. Cardiovascular: RRR. No JVD. Respiratory: CTA Bilaterally. Normal effort. GI: BS +. Non-distended. Musc: Mild tenderness left shoulder Neurologic:  Motor: 5/5 in RIght  deltoid, bicep, tricep, grip, hip flexor, knee extensors, ankle dorsiflexors 4-/5 on LUE/LLE Skin: No evidence of breakdown, no evidence of rash  Assessment/Plan: 1. Functional deficits secondary to RIght pontine infarct which require 3+ hours per day of interdisciplinary therapy in a comprehensive inpatient rehab setting.  Physiatrist is providing close team supervision and 24 hour management of active medical problems listed below.  Physiatrist and rehab team continue to assess barriers to discharge/monitor patient progress toward functional and medical goals  Care Tool:  Bathing    Body parts bathed by patient: Right arm, Left arm, Chest, Abdomen, Front perineal area,  Buttocks, Right upper leg, Left upper leg, Right lower leg, Left lower leg, Face         Bathing assist Assist Level: Contact Guard/Touching assist     Upper Body Dressing/Undressing Upper body dressing   What is the patient wearing?: Pull over shirt    Upper body assist Assist Level: Supervision/Verbal cueing    Lower Body Dressing/Undressing Lower body dressing      What is the patient wearing?: Pants     Lower body assist Assist for lower body dressing: Contact Guard/Touching assist     Toileting Toileting    Toileting assist Assist for toileting: Supervision/Verbal cueing     Transfers Chair/bed transfer  Transfers assist     Chair/bed transfer assist level: Independent with assistive device Chair/bed transfer assistive device: Arboriculturist assist      Assist level: Supervision/Verbal cueing Assistive device: Walker-rolling Max distance: 200   Walk 10 feet activity   Assist     Assist level: Supervision/Verbal cueing Assistive device: Walker-rolling   Walk 50 feet activity   Assist    Assist level: Supervision/Verbal cueing Assistive device: Walker-rolling    Walk 150 feet activity   Assist Walk 150 feet activity did not occur: Safety/medical concerns  Assist level: Supervision/Verbal cueing Assistive device: Walker-rolling    Walk 10 feet on uneven surface  activity   Assist     Assist level: Moderate Assistance -  Patient - 50 - 74% Assistive device: Photographer   Type of Wheelchair: Power    Wheelchair assist level: Minimal Assistance - Patient > 75% Max wheelchair distance: 130ft    Wheelchair 50 feet with 2 turns activity    Assist        Assist Level: Minimal Assistance - Patient > 75%   Wheelchair 150 feet activity     Assist     Assist Level: Minimal Assistance - Patient > 75%    Medical Problem List and Plan: 1.Leftside  weakness with dysarthriasecondary to acute infarct right pons Continue CIR  Notes reviewed- deficits from stroke, images reviewed- right brainstem infarct, labs reviewed 2. DVT Prophylaxis/Anticoagulation: Subcutaneous Lovenox. Monitor for any bleeding episodes 3. Pain Management:Tylenol as needed  Local measures for left shoulder status post vaccines 4. Mood:Provide emotional support 5. Neuropsych: This patientiscapable of making decisions on hisown behalf. 6. Skin/Wound Care:Routine skin checks 7. Fluids/Electrolytes/Nutrition:Routine in and outs  -encourage PO 8.Hypertension. Lisinopril 5 mg daily. Monitor with increased mobility   Controlled 10/14 Vitals:   03/24/18 2043 03/25/18 0502  BP: 129/77 119/79  Pulse: 71 75  Resp: (!) 24 18  Temp: 98.3 F (36.8 C) 98.3 F (36.8 C)  SpO2: 100% 98%  9.Mobitz type I. Asymptomatic. Follow-up outpatient cardiology 10.History of alcohol/tobacco and polysubstance abuse. Continue NicoDerm patch. Provide counseling 11.Hyperlipidemia. Fenofibrate/Lipitor 12.  Constipation  Senna 2 po BID   Improving 13.  Leukocytosis  Afebrile  WBCs 11.5 on 10/14  Continue to monitor  14.  Diabetes mellitus type 2 in obese  Carb modified diet  Continue to monitor     LOS: 3 days A FACE TO FACE EVALUATION WAS PERFORMED  Kassadi Presswood Karis Juba 03/25/2018, 10:07 AM

## 2018-03-25 NOTE — Progress Notes (Signed)
Physical Therapy Session Note  Patient Details  Name: Clinton Knight MRN: [id number] Date of Birth: 1961/05/20  Today's Date: 03/25/2018 PT Individual Time: 1300-1400 and 1530-1600 PT Individual Time Calculation (min): 60 min and 30 min  Short Term Goals: Week 1:  PT Short Term Goal 1 (Week 1): STG=LTG due to ELOS  Skilled Therapeutic Interventions/Progress Updates:    Session 1: Pt supine in bed upon PT arrival, agreeable to therapy tx and denies pain. Pt transferred to EOB with supervision. Pt ambulated from room>gym x 150 ft with RW and CGA. Pt participated in berg balance test as detailed below, scored 40/56 and discussed results with the pt. Pt ambulated without AD to the steps x 80 ft with min assist, pt performed lateral step ups on 3 inch step with L LE working on knee control and neuro re-ed, x 15. Pt performed x 10 mini squats with dynadisc under R LE for increased weightbearing through L LE. Pt ambulated back to mat without AD min assist. Pt performed 2 x 10 mini squats with R LE more extended for increased weightbearing through L LE, working on strength and neuro re-ed. Pt transferred from sitting>standing and transferred onto mat in quadruped with min assist. In quadruped pt performed alternating UE raises and alternating LE raises for core strengthening and and neuro re-ed. Pt worked on dynamic balance and strengthening to performed sidestepping in each direction 4 x 20 ft with min assist. Pt ambulated back to room and ambulated in and out of bathroom with RW and CGA, performed toileting with supervision. Pt left seated in recliner with needs in reach.  Session 2: Pt seated in recliner upon PT arrival, agreeable to therapy tx and denies pain. Pt ambulated from room>gym with RW and CGA, verbal cues for decreased reliance on UEs and upright posture. Pt participated in gait training this session x 1 trial with SPC x 60 ft min assist, x 2 trials with R loftstrand crutch x 60 ft and x 150  ft. Therapist providing verbal cues and demonstration for sequencing with the cane. Pt with continued occasional L knee hyperextension during stance, therapist added heel wedge to decreased frequency. Pt ambulated back to room with R loftstrand and CGA x 150 ft, left seated in recliner with needs in reach and chair alarm set. Will continue to assess most appropriate AD for ambulation and functional mobility.    Therapy Documentation Precautions:  Precautions Precautions: Fall Restrictions Weight Bearing Restrictions: No  Balance: Balance Balance Assessed: Yes Standardized Balance Assessment Standardized Balance Assessment: Berg Balance Test Berg Balance Test Sit to Stand: Able to stand without using hands and stabilize independently Standing Unsupported: Able to stand safely 2 minutes Sitting with Back Unsupported but Feet Supported on Floor or Stool: Able to sit safely and securely 2 minutes Stand to Sit: Sits safely with minimal use of hands Transfers: Able to transfer safely, definite need of hands Standing Unsupported with Eyes Closed: Able to stand 10 seconds safely Standing Ubsupported with Feet Together: Able to place feet together independently and stand for 1 minute with supervision From Standing, Reach Forward with Outstretched Arm: Can reach forward >12 cm safely (5") From Standing Position, Pick up Object from Floor: Able to pick up shoe, needs supervision From Standing Position, Turn to Look Behind Over each Shoulder: Looks behind one side only/other side shows less weight shift Turn 360 Degrees: Able to turn 360 degrees safely but slowly Standing Unsupported, Alternately Place Feet on Step/Stool: Able to complete >2  steps/needs minimal assist Standing Unsupported, One Foot in Front: Loses balance while stepping or standing Standing on One Leg: Able to lift leg independently and hold equal to or more than 3 seconds Total Score: 40    Therapy/Group: Individual  Therapy  Cresenciano Genre, PT, DPT 03/25/2018, 1:28 PM

## 2018-03-26 ENCOUNTER — Inpatient Hospital Stay (HOSPITAL_COMMUNITY): Payer: Self-pay

## 2018-03-26 ENCOUNTER — Inpatient Hospital Stay (HOSPITAL_COMMUNITY): Payer: Self-pay | Admitting: Speech Pathology

## 2018-03-26 ENCOUNTER — Inpatient Hospital Stay (HOSPITAL_COMMUNITY): Payer: Self-pay | Admitting: Physical Therapy

## 2018-03-26 ENCOUNTER — Inpatient Hospital Stay (HOSPITAL_COMMUNITY): Payer: Self-pay | Admitting: Occupational Therapy

## 2018-03-26 DIAGNOSIS — G479 Sleep disorder, unspecified: Secondary | ICD-10-CM

## 2018-03-26 MED ORDER — MELATONIN 3 MG PO TABS
1.5000 mg | ORAL_TABLET | Freq: Every day | ORAL | Status: DC
  Administered 2018-03-26: 1.5 mg via ORAL
  Filled 2018-03-26: qty 0.5

## 2018-03-26 MED ORDER — NON FORMULARY: 1.5000 mg | Freq: Every day | Status: DC

## 2018-03-26 NOTE — Progress Notes (Signed)
Physical Therapy Session Note  Patient Details  Name: HANCE CASPERS MRN: [id number] Date of Birth: 10-10-1960  Today's Date: 03/26/2018 PT Individual Time: 1415-1445 PT Individual Time Calculation (min): 30 min   Short Term Goals: Week 1:  PT Short Term Goal 1 (Week 1): STG=LTG due to ELOS  Skilled Therapeutic Interventions/Progress Updates:   Pt in recliner and agreeable to therapy, denies pain. Session focused on gait training w/ SPC and functional mobility. Ambulated around unit w/ supervision, negotiating busy environments, 4 stairs, curbs, uneven surfaces, ramps, and car transfer. CGA needed on stairs and curb for safety w/ verbal and visual cues for technique w/ SPC and no rails. Pt self-correcting his gait pattern w/ SPC throughout session. Returned to room and ended session in supine, all needs in reach.  Therapy Documentation Precautions:  Precautions Precautions: Fall Restrictions Weight Bearing Restrictions: No  Therapy/Group: Individual Therapy  Joy Reiger Melton Krebs 03/26/2018, 2:51 PM

## 2018-03-26 NOTE — Progress Notes (Addendum)
Occupational Therapy Session Note  Patient Details  Name: Clinton Knight MRN: [id number] Date of Birth: [birth year]  Today's Date: 03/26/2018 OT Individual Time: 1300-1400 OT Individual Time Calculation (min): 60 min    Short Term Goals: Week 1:  OT Short Term Goal 1 (Week 1): STGs=LTGs due to ELOS  Skilled Therapeutic Interventions/Progress Updates:    Pt presents sitting up in recliner, reports increased fatigue due to not having slept well last night but is agreeable to therapy, mild c/o pain in L shoulder due to recent flu shot but with no other c/o pain during session. Pt ambulates into bathroom using SPC and completes toileting with Supervision. After washing hands pt ambulates to therapy gym using SPC, overall completing with CGA-supervision. Pt participating in dynamic standing task with focus on LUE FMC/strengthening using clothespins. Pt standing on level surface and also on blue foam wedge for increased balance challenge during task while placing and removing clothespins, performing with CGA. Additional focus on activity tolerance and functional mobility using SPC outdoors. Pt ambulates with use of SPC from rehab unit to outdoors. Pt ambulating over uneven surfaces including brick sidewalk, and slight ramped sidewalk, with overall CGA and minA provided when ambulating down/up incline of sidewalk. Pt taking x2 seated rest breaks during activity. Pt also noted to intermittently bump into L side of doors/doorframes during activity, question slight L inattention? Returned to unit and pt completing toileting prior to return to recliner. Pt left seated with seatbelt alarm set, call bell and needs within reach.   Therapy Documentation Precautions:  Precautions Precautions: Fall Restrictions Weight Bearing Restrictions: No    Pain: Pain Assessment Pain Score: 0-No pain   Therapy/Group: Individual Therapy  Orlando Penner 03/26/2018, 12:54 PM

## 2018-03-26 NOTE — Progress Notes (Signed)
[place] PHYSICAL MEDICINE & REHABILITATION PROGRESS NOTE   Subjective/Complaints: Patient seen laying in bed this morning.  He states he did not sleep well overnight and request medications to help him sleep.  He notes his arm is less sore than yesterday.  ROS: + Left shoulder soreness.  Denies CP, SOB, N/V/D  Objective:   No results found. Recent Labs    03/25/18 0551  WBC 11.5*  HGB 14.4  HCT 46.6  PLT 253   Recent Labs    03/25/18 0551  NA 137  K 4.3  CL 106  CO2 23  GLUCOSE 172*  BUN 13  CREATININE 1.13  CALCIUM 9.0    Intake/Output Summary (Last 24 hours) at 03/26/2018 0902 Last data filed at 03/26/2018 1610 Gross per 24 hour  Intake 540 ml  Output 1225 ml  Net -685 ml     Physical Exam: Vital Signs Blood pressure 128/77, pulse 70, temperature 98.1 F (36.7 C), resp. rate 18, height 5\' 6"  (1.676 m), weight 87.7 kg, SpO2 99 %.  Constitutional: No distress . Vital signs reviewed. HENT: Normocephalic.  Atraumatic. Eyes: EOMI. No discharge. Cardiovascular: RRR.  No JVD. Respiratory: CTA bilaterally.  Normal effort. GI: BS +. Non-distended. Musc: Mild tenderness left shoulder Neurologic:  Motor: 5/5 in RIght  deltoid, bicep, tricep, grip, hip flexor, knee extensors, ankle dorsiflexors, unchanged 4-/5 on LUE/LLE, unchanged Skin: No evidence of breakdown, no evidence of rash  Assessment/Plan: 1. Functional deficits secondary to RIght pontine infarct which require 3+ hours per day of interdisciplinary therapy in a comprehensive inpatient rehab setting.  Physiatrist is providing close team supervision and 24 hour management of active medical problems listed below.  Physiatrist and rehab team continue to assess barriers to discharge/monitor patient progress toward functional and medical goals  Care Tool:  Bathing    Body parts bathed by patient: Right arm, Left arm, Chest, Abdomen, Front perineal area, Buttocks, Right upper leg, Left upper leg,  Right lower leg, Left lower leg, Face         Bathing assist Assist Level: Supervision/Verbal cueing     Upper Body Dressing/Undressing Upper body dressing   What is the patient wearing?: Pull over shirt    Upper body assist Assist Level: Supervision/Verbal cueing    Lower Body Dressing/Undressing Lower body dressing      What is the patient wearing?: Pants     Lower body assist Assist for lower body dressing: Contact Guard/Touching assist     Toileting Toileting    Toileting assist Assist for toileting: Supervision/Verbal cueing     Transfers Chair/bed transfer  Transfers assist     Chair/bed transfer assist level: Supervision/Verbal cueing Chair/bed transfer assistive device: Geologist, engineering   Ambulation assist      Assist level: Minimal Assistance - Patient > 75% Assistive device: Walker-rolling Max distance: 80 ft   Walk 10 feet activity   Assist     Assist level: Minimal Assistance - Patient > 75% Assistive device: Walker-rolling   Walk 50 feet activity   Assist    Assist level: Minimal Assistance - Patient > 75% Assistive device: Walker-rolling    Walk 150 feet activity   Assist Walk 150 feet activity did not occur: Safety/medical concerns  Assist level: Supervision/Verbal cueing Assistive device: Walker-rolling    Walk 10 feet on uneven surface  activity   Assist     Assist level: Moderate Assistance - Patient - 50 - 74% Assistive device: Development worker, international aid  Assist   Type of Wheelchair: Manual(per PT note)    Wheelchair assist level: Minimal Assistance - Patient > 75% Max wheelchair distance: 173ft    Wheelchair 50 feet with 2 turns activity    Assist        Assist Level: Minimal Assistance - Patient > 75%   Wheelchair 150 feet activity     Assist     Assist Level: Minimal Assistance - Patient > 75%    Medical Problem List and Plan: 1.Leftside weakness with  dysarthriasecondary to acute infarct right pons Continue CIR 2. DVT Prophylaxis/Anticoagulation: Subcutaneous Lovenox. Monitor for any bleeding episodes 3. Pain Management:Tylenol as needed  Local measures for left shoulder status post vaccines 4. Mood:Provide emotional support 5. Neuropsych: This patientiscapable of making decisions on hisown behalf. 6. Skin/Wound Care:Routine skin checks 7. Fluids/Electrolytes/Nutrition:Routine in and outs  -encourage PO 8.Hypertension. Lisinopril 5 mg daily. Monitor with increased mobility   Controlled 10/15 Patient Vitals for the past 24 hrs:  BP Temp Temp src Pulse Resp SpO2  03/26/18 0608 128/77 98.1 F (36.7 C) - 70 18 99 %  03/25/18 2327 125/84 98.3 F (36.8 C) Oral 65 18 99 %  03/25/18 1525 112/62 - - 65 16 99 %  03/25/18 1019 114/75 98.3 F (36.8 C) Oral (!) 57 18 96 %   9.Mobitz type I. Asymptomatic. Follow-up outpatient cardiology 10.History of alcohol/tobacco and polysubstance abuse. Continue NicoDerm patch. Provide counseling 11.Hyperlipidemia. Fenofibrate/Lipitor 12.  Constipation  Senna 2 po BID   Improving 13.  Leukocytosis  Afebrile  WBCs 11.5 on 10/14  Continue to monitor  14.  Diabetes mellitus type 2 in obese  Carb modified diet  Continue to monitor 15.  Sleep disturbance  Melatonin started on 10/15     LOS: 4 days A FACE TO FACE EVALUATION WAS PERFORMED  Clinton Knight 03/26/2018, 9:02 AM

## 2018-03-26 NOTE — Progress Notes (Signed)
Physical Therapy Session Note  Patient Details  Name: Clinton Knight MRN: [id number] Date of Birth: 1960/09/28  Today's Date: 03/26/2018 PT Individual Time: 0900-1000 PT Individual Time Calculation (min): 60 min   Short Term Goals: Week 1:  PT Short Term Goal 1 (Week 1): STG=LTG due to ELOS  Skilled Therapeutic Interventions/Progress Updates:    Pt supine in bed upon PT arrival, agreeable to therapy tx and denies pain. Pt transferred to sitting EOB with supervision and donned teds, socks and shoes with supervision. Pt ambulated within room with RW and supervision to use bathroom, wash face, brush teeth. Pt ambulated from room>gym with R Lofstrand and CGA, x 200 ft. Pt ambulated with SPC this session and CGA from gym>rehab apartment x 100 ft, performed bed mobility with supervision, ambulated to recliner with CGA and SPC and ambulated back to gym x100 ft with SPC. Pt ambulated x 100 ft without AD min assist, increased L hyperextension noted with therapist providing tactile cues to correct. Pt worked on L neuro re-ed and L knee control this session to perform 2 x 10 mini squats, L terminal knee extension with theraband, sidestepping with knee flexion and theraband around knees 4 x 10, and 2 x 10 bridges with theraband around knees. Pt performed theraband walk outs forward and backward 4 x 30 ft with min guard assist. Pt ambulated back to room with CGA and SPC, left in recliner with needs in reach and chair alarm set.   Therapy Documentation Precautions:  Precautions Precautions: Fall Restrictions Weight Bearing Restrictions: No    Therapy/Group: Individual Therapy  Cresenciano Genre, PT, DPT 03/26/2018, 7:50 AM

## 2018-03-26 NOTE — Progress Notes (Signed)
Social Work Assessment and Plan  Patient Details  Name: Clinton Knight MRN: [id number] Date of Birth: 02-Mar-1961  Today's Date: 03/25/2018  Problem List:  Patient Active Problem List   Diagnosis Date Noted  . Sleep disturbance   . Diabetes mellitus type 2 in obese ([place])   . Leukocytosis   . Slow transit constipation   . Essential hypertension   . Right pontine stroke ([place]) 03/22/2018  . Sinus pause   . Stroke [facility]) 03/19/2018   Past Medical History:  Past Medical History:  Diagnosis Date  . Acute kidney insufficiency    03/2018: Cr 1.1 in setting of stroke (baseline ~0.9)  . Alcohol abuse   . Cocaine abuse ([place])    03/2018: Postitive cocaine UA tox screen  . Diabetes ([place])    03/20/18: A1C 6.4  . Hypertension   . Hypertriglyceridemia    03/20/18 TG 511  . Nicotine abuse   . Stroke [facility])    Past Surgical History:  Past Surgical History:  Procedure Laterality Date  . NO PAST SURGERIES     Social History:  reports that he has been smoking cigarettes. He has been smoking about 1.00 pack per day. He has never used smokeless tobacco. He reports that he drinks alcohol. He reports that he has current or past drug history. Drug: Cocaine.  Family / Support Systems Marital Status: Single Patient Roles: Parent, Other (Comment)(brother) Children: 2 daugthers - 1 is 50 and has 4 children and 1 is in high school Other Supports: Clinton Knight - sister - [phone]) [phone];  Clinton Knight - sister - [phone] Anticipated Caregiver: 6 siblings: plan to live at [place] and have Clinton Knight during the day plus 6 other siblings; one sibling died Ability/Limitations of Caregiver: family can do min A, but pt will likely be supervision to mod I level at d/c Caregiver Availability: 24/7 Family Dynamics: Pt reports good support from siblings, but that he doesn't want to be a financial burden on them as they talk about bills in front of him.  Social History Preferred language:  English Religion: None Read: Yes Write: Yes Employment Status: Unemployed Date Retired/Disabled/Unemployed: Pt recently lost his job and was in the process of looking for a new job.  He has been offered a job while he's been in the hospital. Legal History/Current Legal Issues: Pt is due in court November 11th for a traffic ticket. Guardian/Conservator: N/A - MD has determined that pt is capable of making his own decisions.   Abuse/Neglect Abuse/Neglect Assessment Can Be Completed: Yes Physical Abuse: Denies Verbal Abuse: Denies Sexual Abuse: Denies Exploitation of patient/patient's resources: Denies Self-Neglect: Denies  Emotional Status Pt's affect, behavior and adjustment status: Pt is frustrated with the situation and with himself and puts a lot of blame on himself, but he remains motivated to rehabilitate and to get back on his feet.   Recent Psychosocial Issues: Pt is recently homeless due to loss of income and loss of home.  Sister has taken him in.  Pt recently offered a job, while in the hospital, he was pursuing prior to admission.  He hopes to have them hold the position for him until he can work. Psychiatric History: none reported Substance Abuse History: Pt admits to alcohol (beer) and cocaine use.  He reports being ready to leave that behind after having the stroke.  He does not want resources for substance abuse at this time, but will let CSW know should he change his mind.  Patient /  Family Perceptions, Expectations & Goals Pt/Family understanding of illness & functional limitations: Pt reports a good understanding of his condition and limitations. Premorbid pt/family roles/activities: Pt enjoys watching sports (used to play).  He also spends time with family and friends. Anticipated changes in roles/activities/participation: Pt would like to resume activities as he is able.  He is also hopeful to start his new job as soon as he can. Pt/family expectations/goals: Pt does not  want to be a burden to anyone and wants to return to work.  He hopes to turn his life around and relies on his faith in God to help with this.  Community Resources [employer]: None Premorbid Home Care/DME Agencies: None Transportation available at discharge: family Resource referrals recommended: Neuropsychology, Support group (specify)  Discharge Planning Support Systems: Other relatives, Children Type of Residence: Shelter/Homeless Insurance Resources: Teacher, adult education Resources: Family Support Financial Screen Referred: Previously completed Living Expenses: Lives with family Money Management: Patient Does the patient have any problems obtaining your medications?: Yes (Describe)(Pt is currently uninsured.) Home Management: Pt helps his sister with this. Patient/Family Preliminary Plans: Pt plans to return to his sister's home or with his brother until he can recover, start working, and get a home of his own. Social Work Anticipated Follow Up Needs: Support Group, HH/OP(Pt will probably go home with a home exercise program from PT/OT/ST.) Expected length of stay: 7 to 10 days  Clinical Impression CSW met with pt to introduce self and role of CSW, as well as to complete assessment.  Pt was remorseful about his lifestyle and how that potentially impacted his health now.  He feels this has been eye opening and he is prepared to make some changes.  He really wants his own home so he is not a burden to his family and can be more comfortable in his own space.  Pt is motivated to work hard to get better and to be able to start his new job.  Financial counselor has seen pt and CSW will f/u up on what resources they are looking in to for him.  No other concerns/questions/needs at this time.  CSW will continue to follow and assist as needed.  Clinton Knight, Clinton Knight 03/26/2018, 11:53 AM

## 2018-03-26 NOTE — Progress Notes (Signed)
Speech Language Pathology Daily Session Note  Patient Details  Name: Clinton Knight MRN: [id number] Date of Birth: 1961-02-14  Today's Date: 03/26/2018 SLP Individual Time: 4098-1191 SLP Individual Time Calculation (min): 55 min  Short Term Goals: Week 1: SLP Short Term Goal 1 (Week 1): Pt will utilize speech intelligibility stratgeies in conversation with 90% intelligibility at Mod I.  SLP Short Term Goal 2 (Week 1): Pt will utilize compensatory strategies to recall new, complex daily information with Min A verbal cues. SLP Short Term Goal 3 (Week 1): Pt will demonstrate selective attention in moderately distracting environments in 30 minute intervals with supervision A verbal cues. SLP Short Term Goal 4 (Week 1): Pt will demonstrate functional problem solving for mildly complex tasks with supervision A verbal cues. SLP Short Term Goal 5 (Week 1): Pt will self-monitor and self-correct functional errors during problem solving tasks with supervision A verbal cues.   Skilled Therapeutic Interventions: Skilled treatment session focused on cognitive goals. SLP facilitated session by providing supervision verbal cues for complex problem solving during a novel card task.  However, as speed increased throughout task, patient was Mod I for problem solving and did not require extra processing time. Patient participated in a basic conversation that focused on d/c planning and required supervision verbal cues for anticipatory awareness in regards to going back to work. Patient also recalled events from previous therapy sessions with Mod I. patient left upright in recliner with alarm on and all needs within reach. Continue with current plan of care.      Pain Pain Assessment Pain Score: 0-No pain  Therapy/Group: Individual Therapy  Clinton Knight 03/26/2018, 11:27 AM

## 2018-03-27 ENCOUNTER — Inpatient Hospital Stay (HOSPITAL_COMMUNITY): Payer: Self-pay

## 2018-03-27 ENCOUNTER — Inpatient Hospital Stay (HOSPITAL_COMMUNITY): Payer: Self-pay | Admitting: Occupational Therapy

## 2018-03-27 ENCOUNTER — Inpatient Hospital Stay (HOSPITAL_COMMUNITY): Payer: Self-pay | Admitting: Speech Pathology

## 2018-03-27 MED ORDER — MELATONIN 3 MG PO TABS
3.0000 mg | ORAL_TABLET | Freq: Every day | ORAL | Status: DC
  Administered 2018-03-27 – 2018-03-28 (×2): 3 mg via ORAL
  Filled 2018-03-27 (×2): qty 1

## 2018-03-27 NOTE — Progress Notes (Signed)
Speech Language Pathology Daily Session Note  Patient Details  Name: Clinton Knight MRN: [id number] Date of Birth: 03/22/1961  Today's Date: 03/27/2018 SLP Individual Time: 1000-1030 SLP Individual Time Calculation (min): 30 min  Short Term Goals: Week 1: SLP Short Term Goal 1 (Week 1): Pt will utilize speech intelligibility stratgeies in conversation with 90% intelligibility at Mod I.  SLP Short Term Goal 2 (Week 1): Pt will utilize compensatory strategies to recall new, complex daily information with Min A verbal cues. SLP Short Term Goal 3 (Week 1): Pt will demonstrate selective attention in moderately distracting environments in 30 minute intervals with supervision A verbal cues. SLP Short Term Goal 4 (Week 1): Pt will demonstrate functional problem solving for mildly complex tasks with supervision A verbal cues. SLP Short Term Goal 5 (Week 1): Pt will self-monitor and self-correct functional errors during problem solving tasks with supervision A verbal cues.   Skilled Therapeutic Interventions: Skilled treatment session focused on cognitive goals. SLP facilitated session by administering the MoCA (version 7.3). Patient scored 28/30 points with a score of 26 or above considered normal. Patient demonstrated mild short-term memory deficits which patient reports is baseline. Educated on importance of utilizing memory compensatory strategies at home, he verbalized understanding. Patient left upright in bed with alarm on and all needs within reach. Continue with current plan of care.      Pain No/Denies Pain   Therapy/Group: Individual Therapy  Srikar Chiang 03/27/2018, 10:38 AM

## 2018-03-27 NOTE — Progress Notes (Signed)
Physical Therapy Session Note  Patient Details  Name: Clinton Knight MRN: [id number] Date of Birth: 1960-11-07  Today's Date: 03/27/2018 PT Individual Time: 0900-1000 and 1400-1445 PT Individual Time Calculation (min): 60 min and 45 min  Short Term Goals: Week 1:  PT Short Term Goal 1 (Week 1): STG=LTG due to ELOS  Skilled Therapeutic Interventions/Progress Updates:   Session 1:  Pt standing at sink with NT upon PT arrival, agreeable to therapy tx and denies pain. Pt worked on standing balance this session with supervision while ambulating within room to perform upper/lower body dressing, collecting dirty clothes, performing self-care tasks at sink. Pt ambulated from room>laundry room x 150 ft with [facility] and supervision while carrying laundry. Pt maintained standing balance without UE support while loading washer. Pt ambulated to the gym with [facility] and supervision. Pt performed floor transfer with min guard assist. Pt in long sitting 2 x 30 sec for hamstring stretch. Pt laying supine on mat performed piriformis and glute stretches bilaterally 2 x 30 sec. Pt performed core strengthening exercise on mat x 20 alternating LE flexion/extension. Pt transferred floor>standing with min guard assist. Pt worked on endurance with ambulation and community level ambulation with SPC, ambulated unit>atrium with supervision, atrium>north tower with supervision and back to unit. Pt ambulated to laundry room to put clothes in dryer with supervision. Pt ambulated back to room and left seated with needs in reach.   Session 2:  Pt received in therapy gym from OT, agreeable to therapy tx and denies pain. Pt instructed in [place] level B exercises, given the handout and performed each exercise this session with therapist giving verbal cues for techniques: knee bends, backwards walking, figure 8 walking, sideways walking, tandem stance, one leg stance, sit<>stands and steps. Pt worked on dynamic balance during gait this session  without AD to perform backwards walking, sidestepping, gait with eyes closed, gait with changes in speed/sudden stops. Pt ambulated back to gym and back to room without AD supervision. Pt left seated EOB with needs in reach and bed alarm set.    Therapy Documentation Precautions:  Precautions Precautions: Fall Restrictions Weight Bearing Restrictions: No    Therapy/Group: Individual Therapy  Cresenciano Genre, PT, DPT 03/27/2018, 10:00 AM

## 2018-03-27 NOTE — Consult Note (Signed)
Neuropsychological Consultation   Patient:   Clinton Knight   DOB:   1960-09-15  MR Number:  [id number]  Location:  [facility] [address] CENTER A [id number] [place] [address] Dept: [phone] Loc: [phone]           Date of Service:   03/27/2018  Start Time:   8 AM End Time:   9 AM  Provider/Observer:  Arley Phenix, Psy.D.       Clinical Neuropsychologist       Billing Code/Service: [phone] 4 Units  Chief Complaint:    Clinton Knight is a 57 year old male with history of renal insufficiency, alcohol/tobacco and cocaine abuse, diabetes, and hypertension.  Presented on 03/19/2018 to [facility] with left sided weakness, numbness and slurred speech.  CT showed remote bilateral basal ganglia and left cerebellar infarctions.  UDS positive for cocaine.  MRI showed acute infarct in the right Pons. Patient with persistent left hemiparesis and admitted for CIR program.       Reason for Service:  The patient was referred for neuropsychological consultation below is the HPI for the current admission.  Clinton Knight is a 57 year old right-handed male with history of chronic renal insufficiency with creatinine 1.1, alcohol/tobacco and cocaine abuse, diabetes mellitus, hypertension. Per chart review patient independent prior to admission. Plan is to discharge home with sister and multiple family members that can provide assistance as needed. Patient on no prescription medications at time of admission. Presented 03/19/2018 to [facility] with leftside weakness, numbness and slurred speech. Cranial CT scan negative for acute findings noted remote bilateral basal ganglia and left cerebellar infarctions.Patient did not receive TPA.Urine drug screen positive cocaine. Troponin negative. MRI showed acute infarct in the right pons. T angiogram of head and neck with no acute ischemia, no emergent large vessel occlusion.  Echocardiogram with ejection fraction of 65% no wall motion abnormalities. Cardiology consulted for evaluation of sinus pauses noted on telemetry. No evidence of high-grade AV block noted. Advised to avoid any medications that could cause bradycardia no further work-up was indicated. Maintained on aspirin and Plavix for CVA prophylaxis. Subcutaneous Lovenox for DVT prophylaxis. Tolerating a regular diet. Therapy evaluations completed with recommendations of physical medicine rehab consult. Patient was admitted for a comprehensive rehab program.  Current Status:  Clinton Knight showed good expressive language and speech.  He reports that he continues to have left sided motor deficits.  The patient did acknowledge use of cocaine and admitted to "small" amount in period before stroke.  Patient denies significant depression or anxiety.  Behavioral Observation: Clinton Knight  presents as a 57 y.o.-year-old Right African American Male who appeared his stated age. his dress was Appropriate and he was Well Groomed and his manners were Appropriate to the situation.  his participation was indicative of Appropriate and Attentive behaviors.  There were any physical disabilities noted.  he displayed an appropriate level of cooperation and motivation.     Interactions:    Active Appropriate and Attentive  Attention:   within normal limits and attention span and concentration were age appropriate  Memory:   within normal limits; recent and remote memory intact  Visuo-spatial:  not examined  Speech (Volume):  normal  Speech:   normal;   Thought Process:  Coherent and Relevant  Though Content:  WNL; not suicidal and not homicidal  Orientation:   person, place, time/date and situation  Judgment:   Fair  Planning:  Fair  Affect:    Appropriate  Mood:    Dysphoric  Insight:   Fair  Intelligence:   normal  Medical History:   Past Medical History:  Diagnosis Date  . Acute kidney insufficiency     03/2018: Cr 1.1 in setting of stroke (baseline ~0.9)  . Alcohol abuse   . Cocaine abuse (HCC)    03/2018: Postitive cocaine UA tox screen  . Diabetes (HCC)    03/20/18: A1C 6.4  . Hypertension   . Hypertriglyceridemia    03/20/18 TG 511  . Nicotine abuse   . Stroke [facility])    Psychiatric History:  Patient denies past psychiatric history and denies current depressive or anxiety symptoms.  Family Med/Psych History:  Family History  Problem Relation Age of Onset  . Diabetes Mother   . Stroke Mother   . Hypertension Mother   . Heart attack Brother 59       Brother, MI, 28 yo  . Thyroid disease Daughter        Removed her entire thyroid    Risk of Suicide/Violence: low Patient denies SI or HI  Impression/DX:  Clinton Knight is a 57 year old male with history of renal insufficiency, alcohol/tobacco and cocaine abuse, diabetes, and hypertension.  Presented on 03/19/2018 to [facility] with left sided weakness, numbness and slurred speech.  CT showed remote bilateral basal ganglia and left cerebellar infarctions.  UDS positive for cocaine.  MRI showed acute infarct in the right Pons. Patient with persistent left hemiparesis and admitted for CIR program.    Clinton Knight showed good expressive language and speech.  He reports that he continues to have left sided motor deficits.  The patient did acknowledge use of cocaine and admitted to "small" amount in period before stroke.  Patient denies significant depression or anxiety.   Diagnosis:    Right pontine stroke [facility]) - Plan: Ambulatory referral to Neurology         Electronically Signed   _______________________ Arley Phenix, Psy.D.

## 2018-03-27 NOTE — Progress Notes (Signed)
Occupational Therapy Discharge Summary  Patient Details  Name: Clinton Knight MRN: [id number] Date of Birth: January 28, 1961   Patient has met 9 of 9 long term goals due to improved activity tolerance, improved balance, postural control, functional use of  LEFT upper and LEFT lower extremity, improved attention, improved awareness and improved coordination.  Pt made steady progress with BADLs during this admission and completes all tasks at mod I/I level.  Pt completes all functional transfers at mod I level. Pt uses his LUE at nondominant level and engages in B tasks. Pt's family/friends have not been present for therapy.  Pt reports that someone will be with him initially after discharge. Patient to discharge at overall Modified Independent level.  Patient's care partner is independent to provide the necessary physical assistance with IADLs at discharge.      Recommendation: No f/u recommended at this time.  Equipment: No equipment provided Pt's sister has shower seat.   Reasons for discharge: treatment goals met and discharge from hospital  Patient/family agrees with progress made and goals achieved: Yes  OT Discharge Vision Baseline Vision/History: Wears glasses Wears Glasses: Reading only Patient Visual Report: No change from baseline Vision Assessment?: No apparent visual deficits Perception  Perception: Within Functional Limits Praxis Praxis: Intact Cognition Overall Cognitive Status: Within Functional Limits for tasks assessed Arousal/Alertness: Awake/alert Orientation Level: Oriented X4 Attention: Alternating Sustained Attention: Appears intact Selective Attention: Appears intact Alternating Attention: Appears intact Awareness: Appears intact Problem Solving: Appears intact Safety/Judgment: Appears intact Sensation Sensation Light Touch: Appears Intact Hot/Cold: Appears Intact Proprioception: Appears Intact Stereognosis: Not tested Coordination Gross Motor Movements  are Fluid and Coordinated: Yes Fine Motor Movements are Fluid and Coordinated: Yes Heel Shin Test: decreased speed bilaterally, decreased accuracy on L 9 Hole Peg Test: R-30.94, L-33.12 Motor  Motor Motor: Hemiplegia Motor - Discharge Observations: Very mild L hemi Mobility  Bed Mobility Bed Mobility: Rolling Right;Rolling Left;Sit to Supine;Supine to Sit Rolling Right: Independent Rolling Left: Independent Supine to Sit: Independent Sit to Supine: Independent Transfers Sit to Stand: Independent with assistive device Stand to Sit: Independent with assistive device  Trunk/Postural Assessment  Cervical Assessment Cervical Assessment: Within Functional Limits Thoracic Assessment Thoracic Assessment: Within Functional Limits Lumbar Assessment Lumbar Assessment: Within Functional Limits Postural Control Postural Control: Within Functional Limits  Balance Balance Balance Assessed: Yes Standardized Balance Assessment Standardized Balance Assessment: Berg Balance Test Berg Balance Test Sit to Stand: Able to stand without using hands and stabilize independently Standing Unsupported: Able to stand safely 2 minutes Sitting with Back Unsupported but Feet Supported on Floor or Stool: Able to sit safely and securely 2 minutes Stand to Sit: Sits safely with minimal use of hands Transfers: Able to transfer safely, minor use of hands Standing Unsupported with Eyes Closed: Able to stand 10 seconds safely Standing Ubsupported with Feet Together: Able to place feet together independently and stand 1 minute safely From Standing, Reach Forward with Outstretched Arm: Can reach confidently >25 cm (10") From Standing Position, Pick up Object from Floor: Able to pick up shoe safely and easily From Standing Position, Turn to Look Behind Over each Shoulder: Looks behind from both sides and weight shifts well Turn 360 Degrees: Able to turn 360 degrees safely in 4 seconds or less Standing Unsupported,  Alternately Place Feet on Step/Stool: Able to stand independently and complete 8 steps >20 seconds Standing Unsupported, One Foot in Front: Able to take small step independently and hold 30 seconds Standing on One Leg: Tries to lift leg/unable  to hold 3 seconds but remains standing independently Total Score: 50 Dynamic Sitting Balance Dynamic Sitting - Level of Assistance: 6: Modified independent (Device/Increase time) Static Standing Balance Static Standing - Level of Assistance: 6: Modified independent (Device/Increase time) Dynamic Standing Balance Dynamic Standing - Level of Assistance: 6: Modified independent (Device/Increase time) Extremity/Trunk Assessment RUE Assessment RUE Assessment: Within Functional Limits LUE Assessment LUE Assessment: Exceptions to [facility] General Strength Comments: overall 4-/5 LUE Body System: Neuro Brunstrum levels for arm and hand: Arm;Hand Brunstrum level for arm: Stage V Relative Independence from Synergy Brunstrum level for hand: Stage VI Isolated joint movements   Leroy Libman 03/28/2018, 10:47 AM

## 2018-03-27 NOTE — Progress Notes (Signed)
[place] PHYSICAL MEDICINE & REHABILITATION PROGRESS NOTE   Subjective/Complaints: Patient seen sitting up at the edge of his bed eating breakfast this morning.  He states he did not sleep overnight and wants increase in his medications. He was later seen ambulating with therapies.  He notes decreasing shoulder soreness.  ROS: Denies CP, SOB, N/V/D  Objective:   No results found. Recent Labs    03/25/18 0551  WBC 11.5*  HGB 14.4  HCT 46.6  PLT 253   Recent Labs    03/25/18 0551  NA 137  K 4.3  CL 106  CO2 23  GLUCOSE 172*  BUN 13  CREATININE 1.13  CALCIUM 9.0    Intake/Output Summary (Last 24 hours) at 03/27/2018 0948 Last data filed at 03/26/2018 1930 Gross per 24 hour  Intake 600 ml  Output 600 ml  Net 0 ml     Physical Exam: Vital Signs Blood pressure 127/80, pulse 77, temperature 98 F (36.7 C), temperature source Oral, resp. rate 19, height 5\' 6"  (1.676 m), weight 87.7 kg, SpO2 99 %.  Constitutional: No distress . Vital signs reviewed. HENT: Normocephalic.  Atraumatic. Eyes: EOMI. No discharge. Cardiovascular: RRR.  No JVD. Respiratory: CTA bilaterally. Normal effort. GI: BS +. Non-distended. Musc: Mild tenderness left shoulder Neurologic:  Motor: 5/5 in right  deltoid, bicep, tricep, grip, hip flexor, knee extensors, ankle dorsiflexors, stable 4-/5 on LUE/LLE, stable Skin: No evidence of breakdown, no evidence of rash  Assessment/Plan: 1. Functional deficits secondary to RIght pontine infarct which require 3+ hours per day of interdisciplinary therapy in a comprehensive inpatient rehab setting.  Physiatrist is providing close team supervision and 24 hour management of active medical problems listed below.  Physiatrist and rehab team continue to assess barriers to discharge/monitor patient progress toward functional and medical goals  Care Tool:  Bathing    Body parts bathed by patient: Right arm, Left arm, Chest, Abdomen, Front perineal  area, Buttocks, Right upper leg, Left upper leg, Right lower leg, Left lower leg, Face         Bathing assist Assist Level: Supervision/Verbal cueing     Upper Body Dressing/Undressing Upper body dressing   What is the patient wearing?: Pull over shirt    Upper body assist Assist Level: Supervision/Verbal cueing    Lower Body Dressing/Undressing Lower body dressing      What is the patient wearing?: Pants     Lower body assist Assist for lower body dressing: Contact Guard/Touching assist     Toileting Toileting    Toileting assist Assist for toileting: Supervision/Verbal cueing     Transfers Chair/bed transfer  Transfers assist     Chair/bed transfer assist level: Supervision/Verbal cueing Chair/bed transfer assistive device: Theatre manager   Ambulation assist      Assist level: Supervision/Verbal cueing Assistive device: Cane-straight Max distance: 20 ft   Walk 10 feet activity   Assist     Assist level: Supervision/Verbal cueing Assistive device: Cane-straight   Walk 50 feet activity   Assist    Assist level: Supervision/Verbal cueing Assistive device: Cane-straight    Walk 150 feet activity   Assist Walk 150 feet activity did not occur: Safety/medical concerns  Assist level: Contact Guard/Touching assist Assistive device: Cane-straight    Walk 10 feet on uneven surface  activity   Assist     Assist level: Supervision/Verbal cueing Assistive device: Biochemist, clinical     Assist   Type of Wheelchair: Manual(per PT note)  Wheelchair assist level: Minimal Assistance - Patient > 75% Max wheelchair distance: 145ft    Wheelchair 50 feet with 2 turns activity    Assist        Assist Level: Minimal Assistance - Patient > 75%   Wheelchair 150 feet activity     Assist     Assist Level: Minimal Assistance - Patient > 75%    Medical Problem List and Plan: 1.Leftside weakness  with dysarthriasecondary to acute infarct right pons Continue CIR 2. DVT Prophylaxis/Anticoagulation: Subcutaneous Lovenox. Monitor for any bleeding episodes 3. Pain Management:Tylenol as needed  Local measures for left shoulder status post vaccines 4. Mood:Provide emotional support 5. Neuropsych: This patientiscapable of making decisions on hisown behalf. 6. Skin/Wound Care:Routine skin checks 7. Fluids/Electrolytes/Nutrition:Routine in and outs  Encourage PO 8.Hypertension. Lisinopril 5 mg daily. Monitor with increased mobility   Controlled 10/16 Patient Vitals for the past 24 hrs:  BP Temp Temp src Pulse Resp SpO2  03/27/18 0354 127/80 98 F (36.7 C) Oral 77 19 99 %  03/26/18 2132 115/78 97.9 F (36.6 C) - 66 17 97 %  03/26/18 1507 118/73 98.9 F (37.2 C) Oral (!) 58 18 100 %   9.Mobitz type I. Asymptomatic. Follow-up outpatient cardiology 10.History of alcohol/tobacco and polysubstance abuse. Continue NicoDerm patch. Provide counseling 11.Hyperlipidemia. Fenofibrate/Lipitor 12.  Constipation  Senna 2 po BID   Improving 13.  Leukocytosis  Afebrile  WBCs 11.5 on 10/14  Labs ordered for tomorrow  Continue to monitor  14.  Diabetes mellitus type 2 in obese  Labs ordered for tomorrow  Carb modified diet  Continue to monitor 15.  Sleep disturbance  Melatonin started on 10/15, increased on 10/16     LOS: 5 days A FACE TO FACE EVALUATION WAS PERFORMED  Tashona Calk Karis Juba 03/27/2018, 9:48 AM

## 2018-03-27 NOTE — Progress Notes (Signed)
Occupational Therapy Session Note  Patient Details  Name: Clinton Knight MRN: [id number] Date of Birth: [birth year]  Today's Date: 03/27/2018 OT Individual Time: 1300-1400 OT Individual Time Calculation (min): 60 min    Short Term Goals: Week 1:  OT Short Term Goal 1 (Week 1): STGs=LTGs due to ELOS  Skilled Therapeutic Interventions/Progress Updates:    Pt presents sitting EOB upon arrival to room agreeable to OT tx session. Pt reports mild pain in L knee, reports improvements with ace wrapping earlier today therefore OT reapplied ace bandage prior to ambulation. Pt declines bathing this session, agreeable to completing tomorrow. Dons footwear including teds at mod independent level, ambulates to ADL apartment using [facility] with supervision. Pt performs tub transfer to shower chair with close supervision throughout and min cues for safety/UE placement during transfer completion. Pt ambulates to therapy gym where he engaged in multiple fine motor activities in standing as pt reports he will be standing most of the day at his new job once cleared to begin working again. Pt with mild difficulties noted in use of LUE requiring increased effort, taking seated rest breaks intermittently throughout. Issued pt theraputty and home HEP and pt return demonstrating exercises using LUE with min cues. Pt left seated edge of mat table with handoff to PT to begin next session.   Therapy Documentation Precautions:  Precautions Precautions: Fall Restrictions Weight Bearing Restrictions: No   Therapy/Group: Individual Therapy  Orlando Penner 03/27/2018, 7:54 AM

## 2018-03-28 ENCOUNTER — Inpatient Hospital Stay (HOSPITAL_COMMUNITY): Payer: Self-pay | Admitting: Physical Therapy

## 2018-03-28 ENCOUNTER — Inpatient Hospital Stay (HOSPITAL_COMMUNITY): Payer: Self-pay | Admitting: Speech Pathology

## 2018-03-28 ENCOUNTER — Inpatient Hospital Stay (HOSPITAL_COMMUNITY): Payer: Self-pay

## 2018-03-28 DIAGNOSIS — N182 Chronic kidney disease, stage 2 (mild): Secondary | ICD-10-CM

## 2018-03-28 LAB — CBC WITH DIFFERENTIAL/PLATELET
Abs Immature Granulocytes: 0.04 10*3/uL (ref 0.00–0.07)
Basophils Absolute: 0.1 10*3/uL (ref 0.0–0.1)
Basophils Relative: 1 %
EOS ABS: 0.4 10*3/uL (ref 0.0–0.5)
Eosinophils Relative: 3 %
HCT: 48.7 % (ref 39.0–52.0)
Hemoglobin: 14.6 g/dL (ref 13.0–17.0)
Immature Granulocytes: 0 %
Lymphocytes Relative: 44 %
Lymphs Abs: 5.2 10*3/uL — ABNORMAL HIGH (ref 0.7–4.0)
MCH: 27.2 pg (ref 26.0–34.0)
MCHC: 30 g/dL (ref 30.0–36.0)
MCV: 90.7 fL (ref 80.0–100.0)
MONO ABS: 1.1 10*3/uL — AB (ref 0.1–1.0)
Monocytes Relative: 9 %
NEUTROS ABS: 5.2 10*3/uL (ref 1.7–7.7)
NEUTROS PCT: 43 %
Platelets: 298 10*3/uL (ref 150–400)
RBC: 5.37 MIL/uL (ref 4.22–5.81)
RDW: 13.4 % (ref 11.5–15.5)
WBC: 12 10*3/uL — AB (ref 4.0–10.5)
nRBC: 0 % (ref 0.0–0.2)

## 2018-03-28 LAB — BASIC METABOLIC PANEL
Anion gap: 10 (ref 5–15)
BUN: 14 mg/dL (ref 6–20)
CALCIUM: 9.3 mg/dL (ref 8.9–10.3)
CO2: 24 mmol/L (ref 22–32)
CREATININE: 1.34 mg/dL — AB (ref 0.61–1.24)
Chloride: 102 mmol/L (ref 98–111)
GFR calc Af Amer: >60 mL/min (ref >60)
GFR calc non Af Amer: 58 mL/min — ABNORMAL LOW (ref >60)
GLUCOSE: 110 mg/dL — AB (ref 70–99)
Potassium: 4.5 mmol/L (ref 3.5–5.1)
SODIUM: 136 mmol/L (ref 135–145)

## 2018-03-28 LAB — URINALYSIS, COMPLETE (UACMP) WITH MICROSCOPIC
Bacteria, UA: NONE SEEN
Bilirubin Urine: NEGATIVE
Glucose, UA: NEGATIVE mg/dL
Hgb urine dipstick: NEGATIVE
KETONES UR: NEGATIVE mg/dL
Leukocytes, UA: NEGATIVE
Nitrite: NEGATIVE
Protein, ur: NEGATIVE mg/dL
SPECIFIC GRAVITY, URINE: 1.009 (ref 1.005–1.030)
pH: 8 (ref 5.0–8.0)

## 2018-03-28 MED ORDER — NICOTINE 14 MG/24HR TD PT24
14.0000 mg | MEDICATED_PATCH | Freq: Every day | TRANSDERMAL | Status: DC
  Administered 2018-03-28 – 2018-03-29 (×2): 14 mg via TRANSDERMAL
  Filled 2018-03-28 (×2): qty 1

## 2018-03-28 NOTE — Progress Notes (Signed)
Occupational Therapy Session Note  Patient Details  Name: Clinton Knight MRN: [id number] Date of Birth: Aug 16, 1960  Today's Date: 03/28/2018 OT Individual Time: 4098-1191 OT Individual Time Calculation (min): 71 min    Short Term Goals: Week 1:  OT Short Term Goal 1 (Week 1): STGs=LTGs due to ELOS  Skilled Therapeutic Interventions/Progress Updates:    OT intervention with focus on bathing/dressing, toileting, functional amb with/without SPC, standing balance, LUE coordination, activity tolerance, and safety awareness to increase independence with BADLs and prepare for discharge tomorrow.  Pt independently demonstrated theraputty HEP. Pt amb without SPC to ADL apartment and engaged in simple home mgmt tasks without SPC.  Pt amb with SPC to dayroom and BI gym for balance tasks on noncompliant surface with [employer] and balance board with Wii.  Pt completed tasks without assistance but demonstrated delayed reactions with standing balance tasks on Wii balance board.  Pt returned to room and remained seated in recliner.  Pt mod I in room. Pt pleased with progress and recognizes that balance is his main deficit.    Therapy Documentation Precautions:  Precautions Precautions: Fall Restrictions Weight Bearing Restrictions: No Pain: Pain Assessment Pain Scale: 0-10 Pain Score: 0-No pain    Therapy/Group: Individual Therapy  Rich Brave 03/28/2018, 10:44 AM

## 2018-03-28 NOTE — Progress Notes (Addendum)
Physical Therapy Discharge Summary  Patient Details  Name: Clinton Knight MRN: [id number] Date of Birth: 1960/09/09  Today's Date: 03/28/2018 PT Individual Time: 0800-0855 PT Individual Time Calculation (min): 55 min   Pt in supine and agreeable to therapy, denies pain. Session focused on functional mobility and balance testing as detailed below. Occasional seated rest break 2/2 fatigue. Ambulating on and off w/ SPC, otherwise no AD. Educated pt on [facility] for community ambulation for safety and endurance. Additionally discussed gradual build up of endurance upon d/c and ways to work on endurance training on home including walking. Educated on continued deficits including higher level balance impairments and quality of gait. Pt verbalized understanding and appreciative of education. Performed NuStep 10 min @ level 4 for endurance training. Returned to room and ended session sitting EOB, all needs in reach. Pt safe to ambulate around his room w/o assistance from staff, RN and treatment team in agreement. Additionally provided w/ community resources for individuals w/ little to no income and w/o insurance. Discussed contacting community centers and/or local gyms in the future to see what programs they had. Pt appreciative of materials.   Patient has met 8 of 8 long term goals due to improved activity tolerance, improved balance, improved postural control, increased strength, ability to compensate for deficits, improved attention, improved awareness and improved coordination.  Patient to discharge at an ambulatory level Modified Independent.   Patient's care partner is independent to provide the necessary physical assistance at discharge. Pt planning to live with sister at d/c and he is able to direct any care he needs w/o assist from therapist.   Reasons goals not met: n/a  Recommendation:  Patient will benefit from ongoing skilled PT services in outpatient setting to continue to advance safe functional  mobility, address ongoing impairments in functional balance, coordination, LE strength, and quality of gait, and minimize fall risk.  Equipment: SPC  Reasons for discharge: treatment goals met and discharge from hospital  Patient/family agrees with progress made and goals achieved: Yes  PT Discharge Precautions/Restrictions Precautions Precautions: Fall Restrictions Weight Bearing Restrictions: No Vital Signs Therapy Vitals Pulse Rate: (!) 56 Resp: 18 BP: 117/79 Patient Position (if appropriate): Lying Oxygen Therapy SpO2: 97 % O2 Device: Room Air Pain Pain Assessment Pain Scale: 0-10 Pain Score: 0-No pain Vision/Perception  Perception Perception: Within Functional Limits Praxis Praxis: Intact  Cognition Overall Cognitive Status: Within Functional Limits for tasks assessed Arousal/Alertness: Awake/alert Orientation Level: Oriented X4 Safety/Judgment: Appears intact Sensation Sensation Light Touch: Appears Intact Coordination Gross Motor Movements are Fluid and Coordinated: No Heel Shin Test: decreased speed bilaterally, decreased accuracy on L Motor  Motor Motor - Discharge Observations: Very mild L hemi  Mobility Bed Mobility Bed Mobility: Rolling Right;Rolling Left;Sit to Supine;Supine to Sit Rolling Right: Independent Rolling Left: Independent Supine to Sit: Independent Sit to Supine: Independent Transfers Transfers: Sit to Stand;Stand to Sit;Stand Pivot Transfers Sit to Stand: Independent with assistive device Stand to Sit: Independent with assistive device Stand Pivot Transfers: Independent with assistive device Transfer (Assistive device): Straight cane Locomotion  Gait Ambulation: Yes Gait Assistance: Independent with assistive device Gait Distance (Feet): 150 Feet Assistive device: Straight cane Gait Gait: Yes Gait Pattern: Impaired Gait Pattern: Lateral hip instability;Lateral trunk lean to left Gait velocity: decreased Stairs /  Additional Locomotion Stairs: Yes Stairs Assistance: Independent with assistive device Stair Management Technique: One rail Right Number of Stairs: 2 Height of Stairs: 6 Ramp: Independent with assistive device Curb: Independent with assistive device Wheelchair Mobility  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, forensic)  Trunk/Postural Assessment  Cervical Assessment Cervical Assessment: Within Functional Limits Thoracic Assessment Thoracic Assessment: Within Functional Limits Lumbar Assessment Lumbar Assessment: Within Functional Limits Postural Control Postural Control: Within Functional Limits  Balance Balance Balance Assessed: Yes Standardized Balance Assessment Standardized Balance Assessment: Berg Balance Test Berg Balance Test Sit to Stand: Able to stand without using hands and stabilize independently Standing Unsupported: Able to stand safely 2 minutes Sitting with Back Unsupported but Feet Supported on Floor or Stool: Able to sit safely and securely 2 minutes Stand to Sit: Sits safely with minimal use of hands Transfers: Able to transfer safely, minor use of hands Standing Unsupported with Eyes Closed: Able to stand 10 seconds safely Standing Ubsupported with Feet Together: Able to place feet together independently and stand 1 minute safely From Standing, Reach Forward with Outstretched Arm: Can reach confidently >25 cm (10") From Standing Position, Pick up Object from Floor: Able to pick up shoe safely and easily From Standing Position, Turn to Look Behind Over each Shoulder: Looks behind from both sides and weight shifts well Turn 360 Degrees: Able to turn 360 degrees safely in 4 seconds or less Standing Unsupported, Alternately Place Feet on Step/Stool: Able to stand independently and complete 8 steps >20 seconds Standing Unsupported, One Foot in Front: Able to take small step independently and hold 30 seconds Standing on One Leg: Tries to lift leg/unable to hold 3  seconds but remains standing independently Total Score: 50 Dynamic Sitting Balance Dynamic Sitting - Level of Assistance: 6: Modified independent (Device/Increase time) Static Standing Balance Static Standing - Level of Assistance: 6: Modified independent (Device/Increase time) Dynamic Standing Balance Dynamic Standing - Level of Assistance: 6: Modified independent (Device/Increase time) Extremity Assessment      RLE Assessment RLE Assessment: Within Functional Limits LLE Assessment LLE Assessment: Exceptions to [facility] Passive Range of Motion (PROM) Comments: [facility] General Strength Comments: 5/5 ankle musculature, 4/5 hip and knee musculature    Acel Natzke K Tucker Steedley 03/28/2018, 9:20 AM

## 2018-03-28 NOTE — Progress Notes (Signed)
[place] PHYSICAL MEDICINE & REHABILITATION PROGRESS NOTE   Subjective/Complaints: Patient seen ambulating independently in his room this morning.  He states he still did not sleep well overnight, but he is looking forward to going home tomorrow.  ROS: Denies CP, SOB, N/V/D  Objective:   No results found. Recent Labs    03/28/18 0416  WBC 12.0*  HGB 14.6  HCT 48.7  PLT 298   Recent Labs    03/28/18 0416  NA 136  K 4.5  CL 102  CO2 24  GLUCOSE 110*  BUN 14  CREATININE 1.34*  CALCIUM 9.3    Intake/Output Summary (Last 24 hours) at 03/28/2018 1000 Last data filed at 03/27/2018 1812 Gross per 24 hour  Intake 537 ml  Output -  Net 537 ml     Physical Exam: Vital Signs Blood pressure 117/79, pulse (!) 56, temperature 98.5 F (36.9 C), temperature source Oral, resp. rate 18, height 5\' 6"  (1.676 m), weight 87.7 kg, SpO2 97 %.  Constitutional: No distress . Vital signs reviewed. HENT: Normocephalic.  Atraumatic. Eyes: EOMI. No discharge. Cardiovascular: RRR. No JVD. Respiratory: CTA bilaterally. Norma effort. GI: BS +. Non-distended. Musc: Mild tenderness left shoulder Neurologic:  Motor: 5/5 in right  deltoid, bicep, tricep, grip, hip flexor, knee extensors, ankle dorsiflexors, unchanged 4-4+/5 on LUE/LLE Skin: No evidence of breakdown, no evidence of rash  Assessment/Plan: 1. Functional deficits secondary to RIght pontine infarct which require 3+ hours per day of interdisciplinary therapy in a comprehensive inpatient rehab setting.  Physiatrist is providing close team supervision and 24 hour management of active medical problems listed below.  Physiatrist and rehab team continue to assess barriers to discharge/monitor patient progress toward functional and medical goals  Care Tool:  Bathing    Body parts bathed by patient: Right arm, Left arm, Chest, Abdomen, Front perineal area, Buttocks, Right upper leg, Left upper leg, Right lower leg, Left lower  leg, Face         Bathing assist Assist Level: Supervision/Verbal cueing     Upper Body Dressing/Undressing Upper body dressing   What is the patient wearing?: Pull over shirt    Upper body assist Assist Level: Supervision/Verbal cueing    Lower Body Dressing/Undressing Lower body dressing      What is the patient wearing?: Pants     Lower body assist Assist for lower body dressing: Contact Guard/Touching assist     Toileting Toileting    Toileting assist Assist for toileting: Supervision/Verbal cueing     Transfers Chair/bed transfer  Transfers assist     Chair/bed transfer assist level: Independent with assistive device Chair/bed transfer assistive device: Theatre manager   Ambulation assist      Assist level: Independent with assistive device Assistive device: Cane-straight Max distance: >150'   Walk 10 feet activity   Assist     Assist level: Independent with assistive device Assistive device: Cane-straight   Walk 50 feet activity   Assist    Assist level: Independent with assistive device Assistive device: Cane-straight    Walk 150 feet activity   Assist Walk 150 feet activity did not occur: Safety/medical concerns  Assist level: Independent with assistive device Assistive device: Lite Gait    Walk 10 feet on uneven surface  activity   Assist     Assist level: Independent with assistive device Assistive device: Ship broker Will patient use wheelchair at discharge?: No Type of Wheelchair: Manual(per PT  note) Wheelchair activity did not occur: N/A  Wheelchair assist level: Minimal Assistance - Patient > 75% Max wheelchair distance: 155ft    Wheelchair 50 feet with 2 turns activity    Assist    Wheelchair 50 feet with 2 turns activity did not occur: N/A   Assist Level: Minimal Assistance - Patient > 75%   Wheelchair 150 feet activity     Assist Wheelchair 150  feet activity did not occur: N/A   Assist Level: Minimal Assistance - Patient > 75%    Medical Problem List and Plan: 1.Leftside weakness with dysarthriasecondary to acute infarct right pons Continue CIR 2. DVT Prophylaxis/Anticoagulation: Subcutaneous Lovenox. Monitor for any bleeding episodes 3. Pain Management:Tylenol as needed  Local measures for left shoulder status post vaccines - improving 4. Mood:Provide emotional support 5. Neuropsych: This patientiscapable of making decisions on hisown behalf. 6. Skin/Wound Care:Routine skin checks 7. Fluids/Electrolytes/Nutrition:Routine in and outs  Encourage PO 8.Hypertension. Lisinopril 5 mg daily. Monitor with increased mobility   Controlled 10/17 Patient Vitals for the past 24 hrs:  BP Temp Temp src Pulse Resp SpO2  03/28/18 0524 117/79 - - (!) 56 18 97 %  03/27/18 2032 125/68 - - 66 18 100 %  03/27/18 1248 121/77 98.5 F (36.9 C) Oral 66 15 97 %   9.Mobitz type I. Asymptomatic. Follow-up outpatient cardiology 10.History of alcohol/tobacco and polysubstance abuse. Continue NicoDerm patch. Provide counseling 11.Hyperlipidemia. Fenofibrate/Lipitor 12.  Constipation  Senna 2 po BID   Improving 13.  Leukocytosis  Afebrile  WBCs 12.0 on 10/17  UA ordered  Continue to monitor  14.  Diabetes mellitus type 2 in obese  Elevated on 10/17  Carb modified diet  Continue to monitor 15.  Sleep disturbance  Melatonin started on 10/15, increased on 10/16 16. CKD, stage II  Cr 1.34 on 10/17  Encourage fluids     LOS: 6 days A FACE TO FACE EVALUATION WAS PERFORMED  Sid Greener Karis Juba 03/28/2018, 10:00 AM

## 2018-03-28 NOTE — Discharge Summary (Signed)
Discharge summary job [id number]

## 2018-03-28 NOTE — Discharge Summary (Signed)
NAME: Clinton Knight, Clinton Knight MEDICAL RECORD [id number] ACCOUNT [id number] DATE OF BIRTH:03-Apr-1961 FACILITY: [facility] LOCATION: [facility] PHYSICIAN:ANDREW Wynn Banker, MD  DISCHARGE SUMMARY  DATE OF DISCHARGE:  03/29/2018  DISCHARGE DIAGNOSES:   1.  Acute infarct right pons subcutaneous Lovenox for deep venous thrombosis prophylaxis. 2.  Hypertension 3.   Mobitz type 1. 4.   History of alcohol, tobacco and polysubstance abuse. 5.  Hyperlipidemia. 6.  Constipation.   7.  Diabetes mellitus type 2.  HOSPITAL COURSE:  This is a 57 year old right-handed male with history of chronic renal insufficiency.  Creatinine 1.1.  Alcohol, tobacco, cocaine use, diabetes mellitus and hypertension.  Independent prior to admission.  Plan was to discharge to home  with his sister.  [facility]19/[facility] with left-sided weakness, slurred speech.  Cranial CT scan negative for acute findings.  Noted remote bilateral basal ganglia and left cerebellar infarctions.  The patient did not  receive tPA.  Urine drug screen positive for cocaine.  Troponin negative.  MRI showed acute infarct in the right pons.  Angiogram of head and neck with no acute ischemia, no emergent large vessel occlusion.  Echocardiogram with ejection fraction of 65%,  no wall motion abnormalities.  Cardiology consulted for evaluation of sinus pauses noted on telemetry.  No evidence of high grade AV block noted advised to avoid any medications that can cause bradycardia.  No further workup indicated.  Maintained on  aspirin and Plavix for CVA prophylaxis.  Subcutaneous Lovenox for DVT prophylaxis.  Therapy evaluations completed and the patient was admitted for comprehensive rehabilitation program.  PAST MEDICAL HISTORY:  See discharge diagnoses.  SOCIAL HISTORY:  Lives alone, independent prior to admission.  Plans to stay with his sister.  FUNCTIONAL STATUS:  Upon admission to rehab services was +2 physical assist sit to  stand, moderate assist 10 feet rolling walker, min mod assist with activities of daily living.  PHYSICAL EXAMINATION: VITAL SIGNS:  Blood pressure 133/73, pulse 55, temperature 97, respirations 18. GENERAL:  Alert male in no acute distress.  Speech mildly dysarthric. HEENT:  EOMs intact. NECK:  Supple, nontender, no JVD. CARDIOVASCULAR:  Rate controlled. ABDOMEN:  Soft, nontender, good bowel sounds. LUNGS:  Clear to auscultation without wheeze.  REHABILITATION HOSPITAL COURSE:  The patient was admitted to inpatient rehabilitation services.  Therapies initiated on a 3-hour daily basis, consisting of physical therapy, occupational therapy, speech therapy and rehabilitation nursing.  The following  issues were addressed during patient's rehabilitation stay:     Pertaining to the patient's acute infarction, right pons, he remained on aspirin and Plavix therapy.  Referral made for followup neurology services.    Subcutaneous Lovenox for DVT prophylaxis.  No bleeding episodes.    Blood pressure is controlled on lisinopril.    History of alcohol or tobacco and polysubstance abuse.  Nicoderm patch.  He was advised no smoking or illicit drug products.    Noted bouts of constipation, resolved with laxative assistance.    In regards to noted Mobitz type 1, asymptomatic, no further workup initially indicated by cardiology services.  Follow up with outpatient.  Blood sugars overall controlled diabetic diet.  Full family teaching completed.  Diabetic teaching completed.    The patient received weekly collaborative interdisciplinary team conferences to discuss estimated length of stay, family teaching, any barriers to discharge.  The patient worked on standing balance supervision while ambulating able to perform upper and  lower body dressing collecting all his clothes and performing self-care tasks.  Ambulates to  the laundry room 150 feet straight point cane, navigating stairs.  It was advised no  driving.  He was fully able to communicate his needs and discharge to home  with family teaching completed.  DISCHARGE MEDICATIONS:  Included aspirin 81 mg p.o. daily, Lipitor 40 mg p.o. daily, Plavix 75 mg p.o. daily,  Fenofibrate 160 mg p.o. daily, lisinopril 5 mg p.o. daily, melatonin 3 mg at bedtime, Nicoderm patch taper as directed.  Senokot 2 tablets p.o.  b.i.d., Tylenol as needed.  DIET:  Diabetic diet.  FOLLOWUP:  The patient will follow up with Dr. Claudette Laws at the outpatient rehab service office as directed.  [facility] call for appointment; Dr. Gilford Rile, cardiology services.  SPECIAL INSTRUCTIONS:  No driving.  No smoking, no alcohol.  AN/NUANCE D:03/28/2018 T:03/28/2018 JOB:[id number]

## 2018-03-28 NOTE — Progress Notes (Signed)
Speech Language Pathology Discharge Summary  Patient Details  Name: Clinton Knight MRN: [id number] Date of Birth: 08-29-1960  Today's Date: 03/28/2018 SLP Individual Time: 1100-1155 SLP Individual Time Calculation (min): 55 min   Skilled Therapeutic Interventions:  Skilled treatment session focused on cognitive goals. SLP facilitated session by providing supervision verbal cues for patient to recall functional information in regards to d/c planning and forms/applications patient needs to fill out for disability, etc. Patient having difficulty keeping information organized, therefore, SLP provided strategies to maximize recall and organization of information such as visual remainders, etc. Patient verbalized understanding of all information. Patient left upright in recliner with all needs within reach. Continue with current plan of care.     Patient has met 5 of 5 long term goals.  Patient to discharge at overall Modified Independent level.   Reasons goals not met: N/A   Clinical Impression/Discharge Summary:  Patient has made excellent gains and has met 5 of 5 LTGs this admission. Currently, patient is overall Mod I to complete functional and familiar tasks safely in regards to awareness, problem solving and attention. Patient with history of baseline memory deficits but can recall functional information, therefore, suspect memory function is baseline. Patient is also Mod I for use of speech intelligibility strategies at the sentence level. Patient education complete and patient will discharge home. Suspect patient is at his baseline level of cognitive functioning, therefore, SLP f/u is not warranted at this time.   Recommendation:  None      Equipment: N/A   Reasons for discharge: Discharged from hospital;Treatment goals met   Patient/Family Agrees with Progress Made and Goals Achieved: Yes    Ailea Rhatigan 03/28/2018, 12:32 PM

## 2018-03-29 LAB — CREATININE, SERUM
Creatinine, Ser: 1.22 mg/dL (ref 0.61–1.24)
GFR calc non Af Amer: >60 mL/min (ref >60)

## 2018-03-29 MED ORDER — NICOTINE 14 MG/24HR TD PT24: MEDICATED_PATCH | TRANSDERMAL | 0 refills | Status: DC

## 2018-03-29 MED ORDER — ACETAMINOPHEN 325 MG PO TABS: 650.0000 mg | ORAL_TABLET | ORAL | Status: DC | PRN

## 2018-03-29 MED ORDER — MELATONIN 3 MG PO TABS: 3.0000 mg | ORAL_TABLET | Freq: Every day | ORAL | 0 refills | Status: DC

## 2018-03-29 MED ORDER — ATORVASTATIN CALCIUM 40 MG PO TABS: 40.0000 mg | ORAL_TABLET | Freq: Every day | ORAL | 0 refills | Status: DC

## 2018-03-29 MED ORDER — FENOFIBRATE 160 MG PO TABS: 160.0000 mg | ORAL_TABLET | Freq: Every day | ORAL | 0 refills | Status: DC

## 2018-03-29 MED ORDER — CLOPIDOGREL BISULFATE 75 MG PO TABS: 75.0000 mg | ORAL_TABLET | Freq: Every day | ORAL | 1 refills | Status: DC

## 2018-03-29 MED ORDER — LISINOPRIL 5 MG PO TABS: 5.0000 mg | ORAL_TABLET | Freq: Every day | ORAL | 0 refills | Status: DC

## 2018-03-29 NOTE — Discharge Instructions (Signed)
Inpatient Rehab Discharge Instructions  Clinton Knight Discharge date and time: No discharge date for patient encounter.   Activities/Precautions/ Functional Status: Activity: activity as tolerated Diet: diabetic diet Wound Care: none needed Functional status:  ___ No restrictions     ___ Walk up steps independently ___ 24/7 supervision/assistance   ___ Walk up steps with assistance ___ Intermittent supervision/assistance  ___ Bathe/dress independently ___ Walk with walker     __x_ Bathe/dress with assistance ___ Walk Independently    ___ Shower independently ___ Walk with assistance    ___ Shower with assistance ___ No alcohol     ___ Return to work/school ________  Special Instructions: No smoking, drinking or alcohol  No driving   COMMUNITY REFERRALS UPON DISCHARGE:   None:  HOME EXERCISE PROGRAM GIVEN TO PATIENT-NO FOLLOW UP RECEOMMENDED  Medical Equipment/Items Ordered:NO EQUIPMENT NEEDED  Other:PATIENT TO FOLLOW UP WITH DISABILITY APPLICATION MATCH GIVEN TO PATIENT FOR MEDICATION ASSISTANCE AND FOLLOW UP WITH JESSICA Delford Field [phone] FOR MEDICAID  GENERAL COMMUNITY RESOURCES FOR PATIENT/FAMILY: Support Groups:[facility] SUPPORT GROUP THE SECOND Thursday @ 6:00-7:00 PM ON THE REHAB UNIT QUESTIONS CALL [phone]  STROKE/TIA DISCHARGE INSTRUCTIONS SMOKING Cigarette smoking nearly doubles your risk of having a stroke & is the single most alterable risk factor  If you smoke or have smoked in the last 12 months, you are advised to quit smoking for your health.  Most of the excess cardiovascular risk related to smoking disappears within a year of stopping.  Ask you doctor about anti-smoking medications  [place] Quit Line: 1-800-QUIT NOW  Free Smoking Cessation Classes [phone]  CHOLESTEROL Know your levels; limit fat & cholesterol in your diet  Lipid Panel     Component Value Date/Time   CHOL 156 03/20/2018 0517   TRIG 511 (H) 03/20/2018 0517   HDL 33 (L) 03/20/2018  0517   CHOLHDL 4.7 03/20/2018 0517   VLDL UNABLE TO CALCULATE IF TRIGLYCERIDE OVER 400 mg/dL 40/34/7425 9563   LDLCALC UNABLE TO CALCULATE IF TRIGLYCERIDE OVER 400 mg/dL 87/56/4332 9518      Many patients benefit from treatment even if their cholesterol is at goal.  Goal: Total Cholesterol (CHOL) less than 160  Goal:  Triglycerides (TRIG) less than 150  Goal:  HDL greater than 40  Goal:  LDL (LDLCALC) less than 100   BLOOD PRESSURE American Stroke Association blood pressure target is less that 120/80 mm/Hg  Your discharge blood pressure is:  BP: 119/79  Monitor your blood pressure  Limit your salt and alcohol intake  Many individuals will require more than one medication for high blood pressure  DIABETES (A1c is a blood sugar average for last 3 months) Goal HGBA1c is under 7% (HBGA1c is blood sugar average for last 3 months)  Diabetes:    Lab Results  Component Value Date   HGBA1C 6.4 (H) 03/20/2018     Your HGBA1c can be lowered with medications, healthy diet, and exercise.  Check your blood sugar as directed by your physician  Call your physician if you experience unexplained or low blood sugars.  PHYSICAL ACTIVITY/REHABILITATION Goal is 30 minutes at least 4 days per week  Activity: Increase activity slowly, Therapies: Physical Therapy: Home Health Return to work:   Activity decreases your risk of heart attack and stroke and makes your heart stronger.  It helps control your weight and blood pressure; helps you relax and can improve your mood.  Participate in a regular exercise program.  Talk with your doctor about the best  form of exercise for you (dancing, walking, swimming, cycling).  DIET/WEIGHT Goal is to maintain a healthy weight  Your discharge diet is:  Diet Order            Diet heart healthy/carb modified Room service appropriate? Yes; Fluid consistency: Thin  Diet effective now              liquids Your height is:  Height: 5\' 6"  (167.6 cm) Your  current weight is: Weight: 87.7 kg Your Body Mass Index (BMI) is:  BMI (Calculated): 31.22  Following the type of diet specifically designed for you will help prevent another stroke.  Your goal weight range is:    Your goal Body Mass Index (BMI) is 19-24.  Healthy food habits can help reduce 3 risk factors for stroke:  High cholesterol, hypertension, and excess weight.  RESOURCES Stroke/Support Group:  Call [phone]   STROKE EDUCATION PROVIDED/REVIEWED AND GIVEN TO PATIENT Stroke warning signs and symptoms How to activate emergency medical system (call 911). Medications prescribed at discharge. Need for follow-up after discharge. Personal risk factors for stroke. Pneumonia vaccine given:  Flu vaccine given:  My questions have been answered, the writing is legible, and I understand these instructions.  I will adhere to these goals & educational materials that have been provided to me after my discharge from the hospital.      My questions have been answered and I understand these instructions. I will adhere to these goals and the provided educational materials after my discharge from the hospital.  Patient/Caregiver Signature _______________________________ Date __________  Clinician Signature _______________________________________ Date __________  Please bring this form and your medication list with you to all your follow-up doctor's appointments.

## 2018-03-29 NOTE — Patient Care Conference (Signed)
Inpatient RehabilitationTeam Conference and Plan of Care Update Date: 03/27/2018   Time: 11:00 AM    Patient Name: Clinton Knight      Medical Record Number: [id number]  Date of Birth: 08-26-1960 Sex: Male         Room/Bed: 4W23C/4W23C-01 Payor Info: Payor: MEDICAID POTENTIAL / Plan: MEDICAID POTENTIAL / Product Type: *No Product type* /    Admitting Diagnosis: CVA  Admit Date/Time:  03/22/2018  2:10 PM Admission Comments: No comment available   Primary Diagnosis:  <principal problem not specified> Principal Problem: <principal problem not specified>  Patient Active Problem List   Diagnosis Date Noted  . CKD (chronic kidney disease), stage II   . Sleep disturbance   . Diabetes mellitus type 2 in obese (HCC)   . Leukocytosis   . Slow transit constipation   . Essential hypertension   . 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 Sinus pause   . Stroke [facility]) 03/19/2018    Expected Discharge Date: Expected Discharge Date: 03/29/18  Team Members Present: Physician leading conference: Dr. Maryla Morrow Social Worker Present: Staci Acosta, LCSW Nurse Present: Vincente Poli, RN PT Present: Woodfin Ganja, PT OT Present: Marcy Siren, OT SLP Present: Feliberto Gottron, SLP PPS Coordinator present : Tora Duck, RN, CRRN     Current Status/Progress Goal Weekly Team Focus  Medical   Left side weakness with dysarthria secondary to acute infarct right pons  Improve mobility, DM, sleep, left shoulder pain  See above   Bowel/Bladder   continent of bowel & bladder, LBM 03/26/18  remain continent  assist as needed   Swallow/Nutrition/ Hydration             ADL's   supervision with functional transfers using RW; CGA for UB/LB bathing/dressing ADLs  mod independent overall   ADL retraining, functional transfers, strengthening, endurance, LUE strengthening/NMR, d/c planning   Mobility   CGA for all mobility with SPC  Mod I  balance, L NMR, gait training, d/c planning    Communication             Safety/Cognition/ Behavioral Observations  Supervision-Mod I  Mod I  recall with strategies, complex problem solving    Pain   no c/o pain on shift, has tylenol prn  pain scale <2/10  assess & treat as needed   Skin   no skin issues  no new skin break down  assess q shift    Rehab Goals Patient on target to meet rehab goals: Yes Rehab Goals Revised: none *See Care Plan and progress notes for long and short-term goals.     Barriers to Discharge  Current Status/Progress Possible Resolutions Date Resolved   Physician    Medical stability     See above  Therapies, optimize DM meds/sleep      Nursing                  PT                    OT                  SLP                SW                Discharge Planning/Teaching Needs:  Pt plans to return to his sister's home until he can get back to work and afford his own home.  Pt with mod I  goals.   Team Discussion:  MD is adjusting diabetes medications and sleep medications.  Pt has shoulder pain from flu shot.  Pt is about at baseline for cognition, having mild memory baseline deficits PTA.  Mod I for memory goals.  Pt using single point cane walking 500'.  Working on floor transfers and higher level balance.  Pt with mod I OT goals, tub txs today.  Pt still has minor left hand weakness.  Revisions to Treatment Plan:  none    Continued Need for Acute Rehabilitation Level of Care: The patient requires daily medical management by a physician with specialized training in physical medicine and rehabilitation for the following conditions: Daily direction of a multidisciplinary physical rehabilitation program to ensure safe treatment while eliciting the highest outcome that is of practical value to the patient.: Yes Daily medical management of patient stability for increased activity during participation in an intensive rehabilitation regime.: Yes Daily analysis of laboratory values and/or radiology  reports with any subsequent need for medication adjustment of medical intervention for : Diabetes problems;Other;Neurological problems   I attest that I was present, lead the team conference, and concur with the assessment and plan of the team.   Chasitty Hehl, Vista Deck 03/29/2018, 10:31 PM

## 2018-03-29 NOTE — Progress Notes (Signed)
[facility] PHYSICAL MEDICINE & REHABILITATION PROGRESS NOTE   Subjective/Complaints: Patient seen lying in bed this morning.  He slept well overnight.  He is ready for discharge.  ROS: Denies CP, SOB, N/V/D  Objective:   No results found. Recent Labs    03/28/18 0416  WBC 12.0*  HGB 14.6  HCT 48.7  PLT 298   Recent Labs    03/28/18 0416 03/29/18 0505  NA 136  --   K 4.5  --   CL 102  --   CO2 24  --   GLUCOSE 110*  --   BUN 14  --   CREATININE 1.34* 1.22  CALCIUM 9.3  --     Intake/Output Summary (Last 24 hours) at 03/29/2018 1026 Last data filed at 03/29/2018 0800 Gross per 24 hour  Intake 840 ml  Output -  Net 840 ml     Physical Exam: Vital Signs Blood pressure (!) 129/105, pulse (!) 51, temperature 97.9 F (36.6 C), resp. rate 18, height 5\' 6"  (1.676 m), weight 87.7 kg, SpO2 99 %.  Constitutional: No distress . Vital signs reviewed. HENT: Normocephalic.  Atraumatic. Eyes: EOMI. No discharge. Cardiovascular: RRR.  No JVD. Respiratory: CTA bilaterally.  Normal effort. GI: BS +. Non-distended. Musc: Mild tenderness left shoulder Neurologic:  Motor: 5/5 in right  deltoid, bicep, tricep, grip, hip flexor, knee extensors, ankle dorsiflexors, stable 4-4+/5 on LUE/LLE, improving Skin: No evidence of breakdown, no evidence of rash  Assessment/Plan: 1. Functional deficits secondary to RIght pontine infarct which require 3+ hours per day of interdisciplinary therapy in a comprehensive inpatient rehab setting.  Physiatrist is providing close team supervision and 24 hour management of active medical problems listed below.  Physiatrist and rehab team continue to assess barriers to discharge/monitor patient progress toward functional and medical goals  Care Tool:  Bathing    Body parts bathed by patient: Right arm, Left arm, Chest, Abdomen, Front perineal area, Buttocks, Right upper leg, Left upper leg, Right lower leg, Left lower leg, Face          Bathing assist Assist Level: Independent with assistive device     Upper Body Dressing/Undressing Upper body dressing   What is the patient wearing?: Pull over shirt    Upper body assist Assist Level: Independent    Lower Body Dressing/Undressing Lower body dressing      What is the patient wearing?: Underwear/pull up, Pants     Lower body assist Assist for lower body dressing: Independent     Toileting Toileting    Toileting assist Assist for toileting: Independent     Transfers Chair/bed transfer  Transfers assist     Chair/bed transfer assist level: Independent with assistive device Chair/bed transfer assistive device: Theatre manager   Ambulation assist      Assist level: Independent with assistive device Assistive device: Cane-straight Max distance: >150'   Walk 10 feet activity   Assist     Assist level: Independent with assistive device Assistive device: Cane-straight   Walk 50 feet activity   Assist    Assist level: Independent with assistive device Assistive device: Cane-straight    Walk 150 feet activity   Assist Walk 150 feet activity did not occur: Safety/medical concerns  Assist level: Independent with assistive device Assistive device: Lite Gait    Walk 10 feet on uneven surface  activity   Assist     Assist level: Independent with assistive device Assistive device: Biochemist, clinical  Assist Will patient use wheelchair at discharge?: No Type of Wheelchair: Manual(per PT note) Wheelchair activity did not occur: N/A  Wheelchair assist level: Minimal Assistance - Patient > 75% Max wheelchair distance: 118ft    Wheelchair 50 feet with 2 turns activity    Assist    Wheelchair 50 feet with 2 turns activity did not occur: N/A   Assist Level: Minimal Assistance - Patient > 75%   Wheelchair 150 feet activity     Assist Wheelchair 150 feet activity did not occur:  N/A   Assist Level: Minimal Assistance - Patient > 75%    Medical Problem List and Plan: 1.Leftside weakness with dysarthriasecondary to acute infarct right pons  DC today  Patient to follow-up with MD for transitional care management in 1 to 2 weeks 2. DVT Prophylaxis/Anticoagulation: Subcutaneous Lovenox. Monitor for any bleeding episodes 3. Pain Management:Tylenol as needed  Local measures for left shoulder status post vaccines -improved 4. Mood:Provide emotional support 5. Neuropsych: This patientiscapable of making decisions on hisown behalf. 6. Skin/Wound Care:Routine skin checks 7. Fluids/Electrolytes/Nutrition:Routine in and outs  Encourage PO 8.Hypertension. Lisinopril 5 mg daily. Monitor with increased mobility   Controlled 10/18 Patient Vitals for the past 24 hrs:  BP Temp Pulse Resp SpO2  03/29/18 0547 (!) 129/105 97.9 F (36.6 C) (!) 51 18 99 %  03/28/18 2103 129/75 97.8 F (36.6 C) 65 18 97 %  03/28/18 1423 129/64 - 68 18 99 %   9.Mobitz type I. Asymptomatic. Follow-up outpatient cardiology 10.History of alcohol/tobacco and polysubstance abuse. Continue NicoDerm patch. Provide counseling 11.Hyperlipidemia. Fenofibrate/Lipitor 12.  Constipation  Senna 2 po BID   Improving 13.  Leukocytosis  Afebrile  WBCs 12.0 on 10/17  UA ordered  Continue to monitor  14.  Diabetes mellitus type 2 in obese  Elevated on 10/17  Carb modified diet  Continue to monitor 15.  Sleep disturbance  Melatonin started on 10/15, increased on 10/16 16. CKD, stage II  Cr 1.22 on 10/18  Encourage fluids     LOS: 7 days A FACE TO FACE EVALUATION WAS PERFORMED  Denica Web Karis Juba 03/29/2018, 10:26 AM

## 2018-03-29 NOTE — Progress Notes (Signed)
Social Work Discharge Note  The overall goal for the admission was met for:   Discharge location: Yes - sister's home  Length of Stay: Yes - 7 days  Discharge activity level: Yes - modified independent  Home/community participation: Yes  Services provided included: MD, RD, PT, OT, SLP, RN, Pharmacy, Neuropsych and SW  Financial Services: Other: Pt has applied for Medicaid.  Follow-up services arranged: Other: Pt to use a single point cane and to follow home exercise program given to him by therapists.  Comments (or additional information): CSW gave pt information on [place] and [employer], [employer], [employer], [place], and Development worker, community.  Patient/Family verbalized understanding of follow-up arrangements: Yes  Individual responsible for coordination of the follow-up plan: pt  Confirmed correct DME delivered: Trey Sailors 03/29/2018    Calyx Hawker, Silvestre Mesi

## 2018-03-29 NOTE — Progress Notes (Signed)
Social Work Patient ID: Clinton Knight, male   DOB: 1961/05/12, 57 y.o.   MRN: [id number]   CSW met with pt to update him on team conference discussion and targeted d/c date of 03-29-18.  He is pleased to be going home, but really wants to have a home of his own.  CSW gave him information on [employer] and [employer], [employer], [employer], and [place] application (at his request).  Financial Counselor has already seen pt.  CSW will continue to follow and assist as needed.

## 2018-04-01 ENCOUNTER — Telehealth: Payer: Self-pay | Admitting: Registered Nurse

## 2018-04-01 NOTE — Telephone Encounter (Signed)
Placed a call to Clinton Knight, no answer. Left message to return the call.  1st attempt.

## 2018-04-02 NOTE — Telephone Encounter (Signed)
2nd attempt, no answer. Placed a call to emergency contacts, they stated they will try to contact him . Awaiting a return call.

## 2018-04-02 NOTE — Telephone Encounter (Signed)
Patient returned phone call regarding hospital follow up appointment with Dr. Wynn Banker

## 2018-04-04 ENCOUNTER — Telehealth: Payer: Self-pay | Admitting: Registered Nurse

## 2018-04-04 NOTE — Telephone Encounter (Signed)
Ptn called office states he lives in [place] and needs to see AK or Et there - advised they don't practice there - he will attempt to arrive on time

## 2018-04-04 NOTE — Telephone Encounter (Signed)
Patient called back again and left a message. He says he is available after 5 pm today

## 2018-04-08 NOTE — Telephone Encounter (Signed)
Return Clinton Knight call, no answer left message to return the call.  This provider placed two calls last week for TC appointment, Clinton Knight wanted a return call after 5:00 pm. Also placed calls to his siblings asking to have Clinton Knight call office. Will await his return call, and his appointment will be changed to [facility].

## 2018-04-09 ENCOUNTER — Other Ambulatory Visit: Payer: Self-pay

## 2018-04-09 ENCOUNTER — Ambulatory Visit: Payer: Self-pay

## 2018-04-09 ENCOUNTER — Encounter: Payer: Self-pay | Admitting: Registered Nurse

## 2018-04-09 ENCOUNTER — Encounter: Payer: Self-pay | Attending: Registered Nurse | Admitting: Registered Nurse

## 2018-04-09 VITALS — BP 126/75 | HR 60 | Ht 66.0 in | Wt 195.0 lb

## 2018-04-09 DIAGNOSIS — Z79899 Other long term (current) drug therapy: Secondary | ICD-10-CM | POA: Insufficient documentation

## 2018-04-09 DIAGNOSIS — I10 Essential (primary) hypertension: Secondary | ICD-10-CM | POA: Insufficient documentation

## 2018-04-09 DIAGNOSIS — G479 Sleep disorder, unspecified: Secondary | ICD-10-CM | POA: Insufficient documentation

## 2018-04-09 DIAGNOSIS — E781 Pure hyperglyceridemia: Secondary | ICD-10-CM | POA: Insufficient documentation

## 2018-04-09 DIAGNOSIS — Z8249 Family history of ischemic heart disease and other diseases of the circulatory system: Secondary | ICD-10-CM | POA: Insufficient documentation

## 2018-04-09 DIAGNOSIS — F17210 Nicotine dependence, cigarettes, uncomplicated: Secondary | ICD-10-CM | POA: Insufficient documentation

## 2018-04-09 DIAGNOSIS — I6350 Cerebral infarction due to unspecified occlusion or stenosis of unspecified cerebral artery: Secondary | ICD-10-CM | POA: Insufficient documentation

## 2018-04-09 DIAGNOSIS — I455 Other specified heart block: Secondary | ICD-10-CM | POA: Insufficient documentation

## 2018-04-09 DIAGNOSIS — E119 Type 2 diabetes mellitus without complications: Secondary | ICD-10-CM | POA: Insufficient documentation

## 2018-04-09 DIAGNOSIS — Z7982 Long term (current) use of aspirin: Secondary | ICD-10-CM | POA: Insufficient documentation

## 2018-04-09 DIAGNOSIS — F1910 Other psychoactive substance abuse, uncomplicated: Secondary | ICD-10-CM | POA: Insufficient documentation

## 2018-04-09 NOTE — Progress Notes (Signed)
Subjective:    Patient ID: Clinton Knight, male    DOB: 03-16-1961, 57 y.o.   MRN: [id number]  HPI: [name] is a 57 year old male who is her for Hospital Follow up, for Right Pontine Stroke, HTN, Sinus  Pause, Sleep Disturbance and Poly Substance Abuse. He has been home and following his HEP he reports.  He reports he has a good appetite.   Clinton Knight hasn't called the [facility], he states he went to the clinic and it was closed. This provider called the [facility],, they will call him for an appointment and Neurology appointment was made for him as well, he verbalizes understanding, He states he has  transportation problems getting to [facility], he was encouraged to keep his appointment with [facility] and hopefully they will be able to establish him with a Neurologist in [place], if so he is to call Dr. Lucia Gaskins office, he verbalizes understanding.   Pain Inventory Average Pain 0 Pain Right Now 0 My pain is no pain  In the last 24 hours, has pain interfered with the following? General activity 0 Relation with others 0 Enjoyment of life 0 What TIME of day is your pain at its worst? night Sleep (in general) Poor  Pain is worse with: walking and bending Pain improves with: none Relief from Meds: no meds  Mobility walk without assistance how many minutes can you walk? 1 mile ability to climb steps?  yes do you drive?  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  yes Getitng better  Physicians involved in your care Any changes since last visit?  no   Family History  Problem Relation Age of Onset  . Diabetes Mother   . Stroke Mother   . Hypertension Mother   . Heart attack Brother 43       Brother, MI, 32 yo  . Thyroid disease Daughter        Removed her entire thyroid   Social History   Socioeconomic History  . Marital  status: Single    Spouse name: Not on file  . Number of children: Not on file  . Years of education: Not on file  . Highest education level: Not on file  Occupational History  . Not on file  Social Needs  . Financial resource strain: Not on file  . Food insecurity:    Worry: Not on file    Inability: Not on file  . Transportation needs:    Medical: Not on file    Non-medical: Not on file  Tobacco Use  . Smoking status: Current Every Day Smoker    Packs/day: 1.00    Types: Cigarettes  . Smokeless tobacco: Never Used  Substance and Sexual Activity  . Alcohol use: Yes  . Drug use: Yes    Types: Cocaine  . Sexual activity: Not on file  Lifestyle  . Physical activity:    Days per week: Not on file    Minutes per session: Not on file  . Stress: Not on file  Relationships  . Social connections:    Talks on phone: Not on file    Gets together: Not on file    Attends religious service: Not on file    Active member of club or organization: Not on file    Attends meetings of clubs or organizations:  Not on file    Relationship status: Not on file  Other Topics Concern  . Not on file  Social History Narrative  . Not on file   Past Surgical History:  Procedure Laterality Date  . NO PAST SURGERIES     Past Medical History:  Diagnosis Date  . Acute kidney insufficiency    03/2018: Cr 1.1 in setting of stroke (baseline ~0.9)  . Alcohol abuse   . Cocaine abuse (HCC)    03/2018: Postitive cocaine UA tox screen  . Diabetes (HCC)    03/20/18: A1C 6.4  . Hypertension   . Hypertriglyceridemia    03/20/18 TG 511  . Nicotine abuse   . Stroke (HCC)    BP 126/75   Pulse (!) 57   Ht 5\' 6"  (1.676 m)   Wt 195 lb (88.5 kg)   SpO2 96%   BMI 31.47 kg/m   Opioid Risk Score:   Fall Risk Score:  `1  Depression screen PHQ 2/9  Depression screen PHQ 2/9 04/09/2018  Decreased Interest 3  Down, Depressed, Hopeless 1  PHQ - 2 Score 4  Altered sleeping 3  Tired, decreased energy 1   Change in appetite 0  Feeling bad or failure about yourself  0  Trouble concentrating 0  Moving slowly or fidgety/restless 2  Suicidal thoughts 0  PHQ-9 Score 10  Difficult doing work/chores Somewhat difficult    Review of Systems  Constitutional: Negative.   HENT: Negative.   Eyes: Negative.   Respiratory: Positive for cough.   Cardiovascular: Negative.   Gastrointestinal: Negative.   Endocrine: Negative.   Genitourinary: Negative.   Musculoskeletal: Negative.   Skin: Negative.   Allergic/Immunologic: Negative.   Neurological: Positive for weakness.  Hematological: Negative.   Psychiatric/Behavioral: Negative.   All other systems reviewed and are negative.      Objective:   Physical Exam  Constitutional: He is oriented to person, place, and time. He appears well-developed and well-nourished.  HENT:  Head: Normocephalic and atraumatic.  Neck: Normal range of motion. Neck supple.  Cardiovascular: Normal rate and regular rhythm.  Pulmonary/Chest: Effort normal and breath sounds normal.  Musculoskeletal:  Normal Muscle Bulk and Muscle Testing Reveals:  Upper Extremities: Full ROM and Muscle Strength  On the Right 5/5 and Left 4/5 Lower Extremities: Full ROM and Muscle Strength 5/5 Arises from chair with ease Narrow Based gait    Neurological: He is alert and oriented to person, place, and time.  Skin: Skin is warm and dry.  Psychiatric: He has a normal mood and affect. His behavior is normal.  Nursing note and vitals reviewed.         Assessment & Plan:  1. Right Pontine Stroke: Continue HEP as Tolerated. Continue current medication regimen.  Has a scheduled appointment with Neurology. 2. Essential HTN: Continue Medication Regimen. He will be Following up with [facility] 3. Sinus Pause: Has an appointment with Cardiology. Continue to Monitor.  4. Sleep Disturbance: Continue current medication regimen. Continue to monitor.  5. Polysubstance Abuse: Mr.  Knight reports he's no longer participating in this activity, and has quit. Educated on substance abuse and he verbalizes understanding.   40 minutes of Face to Face patient care time was spent during this visit. All questions was encouraged and answered.   F/U in 4-6 weeks with Dr. Wynn Banker

## 2018-04-09 NOTE — Telephone Encounter (Signed)
Patient here for hospital follow up visit.

## 2018-04-10 NOTE — Progress Notes (Signed)
Cardiology Office Note Date:  04/11/2018  Patient ID:  Clinton Knight, DOB [birth year], MRN [id number] PCP:  Patient, No Pcp Per  Cardiologist:  Dr. Mariah Milling, MD    Chief Complaint: Hospital follow up  History of Present Illness: Clinton Knight is a 57 y.o. male with history of multiple strokes, sinus pause while sleeping, DM2, HTN, HLD, and polysubstance abuse with ongoing alcohol, tobacco, and cocaine abuse who presents for hospital follow up after recent admission to [facility] from 10/8 to 10/11 for right pontine infarct.   Patient was reportedly previously evaluated by an outside cardiologist for intermittent chest pain with workup being unrevealing per the patient with symptoms continuing for the past several years. He was admitted to the hospital from 10/8 for 10/11 right pontine stroke. On telemetry, overnight while sleeping he was noted to have sinus pauses during sleep of less than 4 seconds felt to be secondary to increased vagal tone. There is mention in notes of there being Mobitz type I with the inability to exclude Mobitz type II noted. No indication for PPM. There was no evidence of high grade AV block on baseline EKG. Recommendation was made to avoid medications that can cause bradycardia. Sleep apnea screening was advised. Echo 10/10 showed an EF of 60-65%, normal wall motion, LV diastolic function normal, no source of emboli. TSH normal, potassium 4.3, magnesium 2.1, urine drug screen positive for cocaine, troponin negative, TC 156, TG 511, direct LDL 113, A1c 6.4. Neurology recommended ASA 81 mg and Plavix 75 mg, and LDL < 70. Discharged to inpatient rehab.   Patient comes in accompanied by his "lady friend" today. He has done reasonably well from a cardiac perspective. He continues to note some left-sided weakness following his stroke that is improving. He denies using cocaine since his discharge. He continues to smoke and drink. He reports a long history of intermittent chest pain that  "goes back years." This pain is randomly occurring and typically lasts 5 seconds and spontaneously resolves. He last had this pain 1 week prior and is currently asymptomatic. He denies any dizziness, presyncope, or syncope. He does report a history of snoring with possible apneic events.   Past Medical History:  Diagnosis Date  . Acute kidney insufficiency    03/2018: Cr 1.1 in setting of stroke (baseline ~0.9)  . Alcohol abuse   . Cocaine abuse (HCC)    03/2018: Postitive cocaine UA tox screen  . Diabetes (HCC)    03/20/18: A1C 6.4  . Hypertension   . Hypertriglyceridemia    03/20/18 TG 511  . Nicotine abuse   . Stroke [facility])     Past Surgical History:  Procedure Laterality Date  . NO PAST SURGERIES      Current Meds  Medication Sig  . aspirin 81 MG chewable tablet Chew 1 tablet (81 mg total) by mouth daily.  Marland Kitchen atorvastatin (LIPITOR) 40 MG tablet Take 1 tablet (40 mg total) by mouth daily at 6 PM.  . clopidogrel (PLAVIX) 75 MG tablet Take 1 tablet (75 mg total) by mouth daily.  Marland Kitchen lisinopril (PRINIVIL,ZESTRIL) 5 MG tablet Take 1 tablet (5 mg total) by mouth daily.  . Melatonin 3 MG TABS Take 1 tablet (3 mg total) by mouth at bedtime.    Allergies:   Patient has no known allergies.   Social History:  The patient  reports that he has been smoking cigarettes. He has been smoking about 0.50 packs per day. He has never used smokeless tobacco.  He reports that he drinks alcohol. He reports that he has current or past drug history. Drug: Cocaine.   Family History:  The patient's family history includes Diabetes in his mother; Heart attack (age of onset: 70) in his brother; Hypertension in his mother; Stroke in his mother; Thyroid disease in his daughter.  ROS:   Review of Systems  Constitutional: Positive for malaise/fatigue. Negative for chills, diaphoresis, fever and weight loss.  HENT: Negative for congestion.   Eyes: Negative for discharge and redness.  Respiratory: Negative for  cough, hemoptysis, sputum production, shortness of breath and wheezing.   Cardiovascular: Positive for chest pain. Negative for palpitations, orthopnea, claudication, leg swelling and PND.  Gastrointestinal: Negative for abdominal pain, blood in stool, heartburn, melena, nausea and vomiting.  Genitourinary: Negative for hematuria.  Musculoskeletal: Negative for falls and myalgias.  Skin: Negative for rash.  Neurological: Positive for sensory change, focal weakness and weakness. Negative for dizziness, tingling, tremors, speech change and loss of consciousness.       Left-sided weakness that is improving  Endo/Heme/Allergies: Does not bruise/bleed easily.  Psychiatric/Behavioral: Negative for substance abuse. The patient is not nervous/anxious.   All other systems reviewed and are negative.    PHYSICAL EXAM:  VS:  BP 110/70 (BP Location: Left Arm, Patient Position: Sitting, Cuff Size: Normal)   Pulse 60   Ht 5\' 6"  (1.676 m)   Wt 194 lb 8 oz (88.2 kg)   BMI 31.39 kg/m  BMI: Body mass index is 31.39 kg/m.  Physical Exam  Constitutional: He is oriented to person, place, and time. He appears well-developed and well-nourished.  HENT:  Head: Normocephalic and atraumatic.  Eyes: Right eye exhibits no discharge. Left eye exhibits no discharge.  Neck: Normal range of motion. No JVD present.  Cardiovascular: Normal rate, regular rhythm, S1 normal, S2 normal and normal heart sounds. Exam reveals no distant heart sounds, no friction rub, no midsystolic click and no opening snap.  No murmur heard. Pulmonary/Chest: Effort normal and breath sounds normal. No respiratory distress. He has no decreased breath sounds. He has no wheezes. He has no rales. He exhibits no tenderness.  Abdominal: Soft. He exhibits no distension. There is no tenderness.  Musculoskeletal: He exhibits no edema.  5/5 strength bilateral upper and lower extremities   Neurological: He is alert and oriented to person, place, and  time.  Skin: Skin is warm and dry. No cyanosis. Nails show no clubbing.  Psychiatric: He has a normal mood and affect. His speech is normal and behavior is normal. Judgment and thought content normal.     EKG:  Was ordered and interpreted by me today. Shows NSR, 60 bpm, normal axis, no acute st/t changes   Recent Labs: 03/21/2018: Magnesium 2.1; TSH 4.131 03/25/2018: ALT 30 03/28/2018: BUN 14; Hemoglobin 14.6; Platelets 298; Potassium 4.5; Sodium 136 03/29/2018: Creatinine, Ser 1.22  03/20/2018: Cholesterol 156; HDL 33; LDL Cholesterol UNABLE TO CALCULATE IF TRIGLYCERIDE OVER 400 mg/dL; Total CHOL/HDL Ratio 4.7; Triglycerides 511; VLDL UNABLE TO CALCULATE IF TRIGLYCERIDE OVER 400 mg/dL 66/44/0347: Direct LDL 113   Estimated Creatinine Clearance: 70.4 mL/min (by C-G formula based on SCr of 1.22 mg/dL).   Wt Readings from Last 3 Encounters:  04/11/18 194 lb 8 oz (88.2 kg)  04/09/18 195 lb (88.5 kg)  03/22/18 193 lb 5.5 oz (87.7 kg)     Other studies reviewed: Additional studies/records reviewed today include: summarized above  ASSESSMENT AND PLAN:  1. Sinus pause with possible Mobitz type I, unable  to exclude Mobitz type II: Currently asymptomatic. Schedule live monitoring Zio given inability to exclude high grade AV block as above. Schedule sleep study. No current indication for emergent PPM. Lytes at goal. Thyroid function normal. Continue to avoid rate limiting medications.   2. Chest pain with moderate risk for cardiac etiology: Currently symptom free. Schedule nuclear stress test to evaluate for high-risk ischemia given risk factors. Risk factor modification.   3. Fatigue: Schedule sleep study as above. Likely in the setting of his recent stroke and admission. Increase activity as tolerated and follow up with PCP.   4. Multiple strokes with residual left side weakness: Followed by neurology and PCP. Remains on ASA and Plavix which is being managed by PCP/neurology.    5. Polysubstance abuse: Complete cessation is advised.   Disposition: F/u with Dr. Mariah Milling in 3 months  Current medicines are reviewed at length with the patient today.  The patient did not have any concerns regarding medicines.  Signed, Eula Listen, PA-C 04/11/2018 1:08 PM     [facility] [address] [place], [place] [address] [phone]

## 2018-04-11 ENCOUNTER — Ambulatory Visit (INDEPENDENT_AMBULATORY_CARE_PROVIDER_SITE_OTHER): Payer: Self-pay | Admitting: Physician Assistant

## 2018-04-11 ENCOUNTER — Encounter: Payer: Self-pay | Admitting: Physician Assistant

## 2018-04-11 VITALS — BP 110/70 | HR 60 | Ht 66.0 in | Wt 194.5 lb

## 2018-04-11 DIAGNOSIS — Z8673 Personal history of transient ischemic attack (TIA), and cerebral infarction without residual deficits: Secondary | ICD-10-CM

## 2018-04-11 DIAGNOSIS — R079 Chest pain, unspecified: Secondary | ICD-10-CM

## 2018-04-11 DIAGNOSIS — I455 Other specified heart block: Secondary | ICD-10-CM

## 2018-04-11 DIAGNOSIS — Z720 Tobacco use: Secondary | ICD-10-CM

## 2018-04-11 DIAGNOSIS — F1410 Cocaine abuse, uncomplicated: Secondary | ICD-10-CM

## 2018-04-11 DIAGNOSIS — R5383 Other fatigue: Secondary | ICD-10-CM

## 2018-04-11 DIAGNOSIS — F1010 Alcohol abuse, uncomplicated: Secondary | ICD-10-CM

## 2018-04-11 NOTE — Patient Instructions (Addendum)
Medication Instructions:  No changes  If you need a refill on your cardiac medications before your next appointment, please call your pharmacy.   Lab work: Your provider would like for you to have the following labs today: CBC and BMET  If you have labs (blood work) drawn today and your tests are completely normal, you will receive your results only by: Marland Kitchen MyChart Message (if you have MyChart) OR . A paper copy in the mail If you have any lab test that is abnormal or we need to change your treatment, we will call you to review the results.  Testing/Procedures: Your physician has requested that you have a lexiscan myoview. For further information please visit [email or web address] Please follow instruction sheet, as given.  Your physician has recommended that you wear an 14 day Zio event monitor. Event monitors are medical devices that record the heart's electrical activity. Doctors most often [place] these monitors to diagnose arrhythmias. Arrhythmias are problems with the speed or rhythm of the heartbeat. The monitor is a small, portable device. You can wear one while you do your normal daily activities. This is usually used to diagnose what is causing palpitations/syncope (passing out).  This monitor will be mailed to you. If you have not heard from them in a few days, please call the office at [phone]   Follow-Up: At [facility], you and your health needs are our priority.  As part of our continuing mission to provide you with exceptional heart care, we have created designated Provider Care Teams.  These Care Teams include your primary Cardiologist (physician) and Advanced Practice Providers (APPs -  Physician Assistants and Nurse Practitioners) who all work together to provide you with the care you need, when you need it. You will need a follow up appointment in 3 months. You may see Julien Nordmann, MD or one of the following Advanced Practice Providers on your designated Care Team:    Nicolasa Ducking, NP Eula Listen, PA-C . Marisue Ivan, PA-C  Any Other Special Instructions Will Be Listed Below (If Applicable). A referral has been placed to pulmonary.    [facility] MYOVIEW  Your provider has ordered a Stress Test with nuclear imaging. The purpose of this test is to evaluate the blood supply to your heart muscle. This procedure is referred to as a "Non-Invasive Stress Test." This is because other than having an IV started in your vein, nothing is inserted or "invades" your body. Cardiac stress tests are done to find areas of poor blood flow to the heart by determining the extent of coronary artery disease (CAD). Some patients exercise on a treadmill, which naturally increases the blood flow to your heart, while others who are unable to walk on a treadmill due to physical limitations have a pharmacologic/chemical stress agent called Lexiscan . This medicine will mimic walking on a treadmill by temporarily increasing your coronary blood flow.   Please note: these test may take anywhere between 2-4 hours to complete  PLEASE REPORT TO [facility] MEDICAL MALL ENTRANCE  THE VOLUNTEERS AT THE FIRST DESK WILL DIRECT YOU WHERE TO GO  Date of Procedure:_____________________________________  Arrival Time for Procedure:______________________________  Instructions regarding medication:  None to hold  PLEASE NOTIFY THE OFFICE AT LEAST 24 HOURS IN ADVANCE IF YOU ARE UNABLE TO KEEP YOUR APPOINTMENT.  [phone] AND  PLEASE NOTIFY NUCLEAR MEDICINE AT [facility] AT LEAST 24 HOURS IN ADVANCE IF YOU ARE UNABLE TO KEEP YOUR APPOINTMENT. [phone]  How to prepare for your Myoview test:  1. Do not eat or drink after midnight 2. No caffeine for 24 hours prior to test 3. No smoking 24 hours prior to test. 4. Your medication may be taken with water.  If your doctor stopped a medication because of this test, do not take that medication. 5. Ladies, please do not wear dresses.  Skirts or pants are  appropriate. Please wear a short sleeve shirt. 6. No perfume, cologne or lotion. 7. Wear comfortable walking shoes. No heels!

## 2018-04-12 LAB — BASIC METABOLIC PANEL
BUN / CREAT RATIO: 13 (ref 9–20)
BUN: 15 mg/dL (ref 6–24)
CO2: 20 mmol/L (ref 20–29)
CREATININE: 1.14 mg/dL (ref 0.76–1.27)
Calcium: 9.6 mg/dL (ref 8.7–10.2)
Chloride: 102 mmol/L (ref 96–106)
GFR calc Af Amer: 83 mL/min/{1.73_m2} (ref >59)
GFR, EST NON AFRICAN AMERICAN: 71 mL/min/{1.73_m2} (ref >59)
GLUCOSE: 120 mg/dL — AB (ref 65–99)
POTASSIUM: 4.8 mmol/L (ref 3.5–5.2)
SODIUM: 140 mmol/L (ref 134–144)

## 2018-04-12 LAB — CBC
Hematocrit: 43.6 % (ref 37.5–51.0)
Hemoglobin: 14.2 g/dL (ref 13.0–17.7)
MCH: 27.6 pg (ref 26.6–33.0)
MCHC: 32.6 g/dL (ref 31.5–35.7)
MCV: 85 fL (ref 79–97)
PLATELETS: 344 10*3/uL (ref 150–450)
RBC: 5.15 x10E6/uL (ref 4.14–5.80)
RDW: 12.4 % (ref 12.3–15.4)
WBC: 9.9 10*3/uL (ref 3.4–10.8)

## 2018-04-19 ENCOUNTER — Encounter: Payer: Self-pay | Admitting: Pulmonary Disease

## 2018-04-19 ENCOUNTER — Ambulatory Visit (INDEPENDENT_AMBULATORY_CARE_PROVIDER_SITE_OTHER): Payer: Self-pay | Admitting: Pulmonary Disease

## 2018-04-19 VITALS — BP 120/70 | HR 62 | Ht 66.0 in | Wt 194.2 lb

## 2018-04-19 DIAGNOSIS — F17200 Nicotine dependence, unspecified, uncomplicated: Secondary | ICD-10-CM

## 2018-04-19 DIAGNOSIS — Z72821 Inadequate sleep hygiene: Secondary | ICD-10-CM

## 2018-04-19 DIAGNOSIS — G4719 Other hypersomnia: Secondary | ICD-10-CM

## 2018-04-19 DIAGNOSIS — G4700 Insomnia, unspecified: Secondary | ICD-10-CM

## 2018-04-19 DIAGNOSIS — R0683 Snoring: Secondary | ICD-10-CM

## 2018-04-19 NOTE — Patient Instructions (Signed)
Please fill the prescription for melatonin and take as prescribed.  Take it at 9 PM each night with the plan of going to bed at 10 PM.    Seek of 8 hours of sleep per night.  Try to avoid artificial light (computers, TV, phone) for an hour prior to bedtime  Sleep study planned.  We will try to do it as a home sleep study if possible.  Depending on your insurance, it might require that it be done in the sleep lab.  Smoking cessation as we discussed to help you reduce your risk of recurrent stroke  We will try to get the Myoview heart study rescheduled since you are unable to get this test on November 15  After sleep study results are available to [place], we will contact you to initiate any therapy if appropriate.  Depending on results of sleep study, we will schedule follow-up with you after therapy for possible sleep apnea is initiated

## 2018-04-21 ENCOUNTER — Inpatient Hospital Stay (INDEPENDENT_AMBULATORY_CARE_PROVIDER_SITE_OTHER): Payer: Self-pay

## 2018-04-21 DIAGNOSIS — I455 Other specified heart block: Secondary | ICD-10-CM

## 2018-04-21 DIAGNOSIS — R079 Chest pain, unspecified: Secondary | ICD-10-CM

## 2018-04-23 NOTE — Progress Notes (Signed)
PULMONARY/SLEEP CONSULT NOTE  Requesting MD/Service: Eula Listen Date of initial consultation: 04/19/18 Reason for consultation: Concern for sleep apnea  PT PROFILE: 57 y.o. male smoker referred for concern regarding possible sleep apnea  DATA:  INTERVAL:  HPI:  The patient really is not certain why he has been sent for evaluation.  However, when I suggested that it was due to possible sleep disorder, he relates to me that he has severe insomnia.  He also reports frequent awakenings during the night.  He denies morning headache.  He does have daytime hypersomnolence.  His Epworth sleepiness scale score is 14.  He has been told that he snores.  He was hospitalized recently and concern was raised for possible apneas.  These symptoms have been present for many years.  His sleep hygiene is very poor.  He watches TV or is on the computer up until the time that he goes to bed.  He usually goes to bed around midnight and awakens at 7 in the morning.  He is employed as an Occupational psychologist.  He is never had any accidents due to falling asleep.  He is a smoker of one half PPD.  He has no prior pulmonary diagnoses.  He is not on any pulmonary medications.  Past Medical History:  Diagnosis Date  . Acute kidney insufficiency    03/2018: Cr 1.1 in setting of stroke (baseline ~0.9)  . Alcohol abuse   . Cocaine abuse (HCC)    03/2018: Postitive cocaine UA tox screen  . Diabetes (HCC)    03/20/18: A1C 6.4  . Hypertension   . Hypertriglyceridemia    03/20/18 TG 511  . Nicotine abuse   . Stroke [facility])     Past Surgical History:  Procedure Laterality Date  . NO PAST SURGERIES      MEDICATIONS: I have reviewed all medications and confirmed regimen as documented  Social History   Socioeconomic History  . Marital status: Single    Spouse name: Not on file  . Number of children: Not on file  . Years of education: Not on file  . Highest education level: Not on file  Occupational  History  . Not on file  Social Needs  . Financial resource strain: Not on file  . Food insecurity:    Worry: Not on file    Inability: Not on file  . Transportation needs:    Medical: Not on file    Non-medical: Not on file  Tobacco Use  . Smoking status: Current Every Day Smoker    Packs/day: 0.50    Types: Cigarettes  . Smokeless tobacco: Never Used  Substance and Sexual Activity  . Alcohol use: Yes  . Drug use: Yes    Types: Cocaine  . Sexual activity: Not on file  Lifestyle  . Physical activity:    Days per week: Not on file    Minutes per session: Not on file  . Stress: Not on file  Relationships  . Social connections:    Talks on phone: Not on file    Gets together: Not on file    Attends religious service: Not on file    Active member of club or organization: Not on file    Attends meetings of clubs or organizations: Not on file    Relationship status: Not on file  . Intimate partner violence:    Fear of current or ex partner: Not on file    Emotionally abused: Not on file  Physically abused: Not on file    Forced sexual activity: Not on file  Other Topics Concern  . Not on file  Social History Narrative  . Not on file    Family History  Problem Relation Age of Onset  . Diabetes Mother   . Stroke Mother   . Hypertension Mother   . Heart attack Brother 4840       Brother, MI, 57 yo  . Thyroid disease Daughter        Removed her entire thyroid    ROS: No fever, myalgias/arthralgias, unexplained weight loss or weight gain No new focal weakness or sensory deficits No otalgia, hearing loss, visual changes, nasal and sinus symptoms, mouth and throat problems No neck pain or adenopathy No abdominal pain, N/V/D, diarrhea, change in bowel pattern No dysuria, change in urinary pattern   Vitals:   04/19/18 1041 04/19/18 1048  BP:  120/70  Pulse:  62  SpO2:  98%  Weight: 194 lb 3.2 oz (88.1 kg)   Height: 5\' 6"  (1.676 m)      EXAM:  Gen:  NAD HEENT: NCAT, sclera white Neck: No JVD Lungs: breath sounds full, no wheezes or other adventitious sounds Cardiovascular: RRR, no murmurs Abdomen: Soft, nontender, normal BS Ext: without clubbing, cyanosis, edema Neuro: grossly intact Skin: Limited exam, no lesions noted   DATA:   BMP Latest Ref Rng & Units 04/11/2018 03/29/2018 03/28/2018  Glucose 65 - 99 mg/dL 409(W120(H) - 119(J110(H)  BUN 6 - 24 mg/dL 15 - 14  Creatinine 4.780.76 - 1.27 mg/dL 2.951.14 6.211.22 3.08(M1.34(H)  BUN/Creat Ratio 9 - 20 13 - -  Sodium 134 - 144 mmol/L 140 - 136  Potassium 3.5 - 5.2 mmol/L 4.8 - 4.5  Chloride 96 - 106 mmol/L 102 - 102  CO2 20 - 29 mmol/L 20 - 24  Calcium 8.7 - 10.2 mg/dL 9.6 - 9.3    CBC Latest Ref Rng & Units 04/11/2018 03/28/2018 03/25/2018  WBC 3.4 - 10.8 x10E3/uL 9.9 12.0(H) 11.5(H)  Hemoglobin 13.0 - 17.7 g/dL 57.814.2 46.914.6 62.914.4  Hematocrit 37.5 - 51.0 % 43.6 48.7 46.6  Platelets 150 - 450 x10E3/uL 344 298 253    CXR 04/22/17: No acute cardiac or pulmonary findings  I have personally reviewed all chest radiographs reported above including CXRs and CT chest unless otherwise indicated  IMPRESSION:     ICD-10-CM   1. Smoker F17.200   2. Daytime hypersomnolence G47.19   3. Snoring R06.83   4. Insomnia G47.00   5. Poor sleep hygiene Z72.821    PLAN:  Counseling regarding smoking cessation was provided Begin melatonin 3 mg p.o. 1 hour before bedtime I have encouraged him to seek at least 8 hours of sleep per night He is encouraged to avoid artificial lights such as TV or computers prior to bedtime Home sleep study ordered  After sleep study results are available to us, we will contact him with instructions regarding further evaluation and management   Billy Fischeravid Shaeley Segall, MD PCCM service Mobile [phone] Pager [phone] 04/23/2018 12:08 PM

## 2018-04-24 ENCOUNTER — Other Ambulatory Visit: Payer: Self-pay

## 2018-04-24 DIAGNOSIS — G4719 Other hypersomnia: Secondary | ICD-10-CM

## 2018-04-25 ENCOUNTER — Telehealth: Payer: Self-pay | Admitting: Cardiovascular Disease

## 2018-04-25 NOTE — Telephone Encounter (Signed)
Kim from [employer] calling to make office aware  States that we had ordered a monitor for patient, typically they call with a welcome call but have been unable to reach patient after 5 or more calls Please call [phone] [phone] with any questions or concerns

## 2018-04-26 ENCOUNTER — Telehealth: Payer: Self-pay | Admitting: Pulmonary Disease

## 2018-04-26 ENCOUNTER — Other Ambulatory Visit: Payer: Self-pay

## 2018-04-26 NOTE — Telephone Encounter (Signed)
I called and spoke with the patient to advise him that [employer] was trying to touch base with him regarding his monitor hook up. However, his phone cut off/ lost reception. I attempted to call him back, but this went to his voice mail. I advised that we were calling to make sure he had his monitor and was doing ok with this. I advised that if at any time while he has the monitor on and he has an abnormal heart rhythm, the monitor company would try to call him, so please pick up if he sees an 800#/ 224# calling him.   I asked that he call back with any questions.

## 2018-04-26 NOTE — Telephone Encounter (Signed)
Michele with [facility] called and stated that she contacted patient to schedule Split Night. Pt declined scheduling Sleep Study due to not having health insurance.   Just FYI for physician. [name]

## 2018-04-30 ENCOUNTER — Telehealth: Payer: Self-pay | Admitting: Physician Assistant

## 2018-04-30 NOTE — Telephone Encounter (Signed)
Pt removed monitor and has mailed it back, [employer] is calling to inform us pt has only worn for 1 week.  Reference # [id number]

## 2018-04-30 NOTE — Telephone Encounter (Signed)
Noted. Await results

## 2018-05-01 ENCOUNTER — Ambulatory Visit: Payer: Self-pay

## 2018-05-03 ENCOUNTER — Inpatient Hospital Stay
Admission: EM | Admit: 2018-05-03 | Discharge: 2018-05-07 | DRG: 282 | Disposition: A | Discharge status: Home / Self Care | Payer: Self-pay | Attending: Internal Medicine | Admitting: Internal Medicine

## 2018-05-03 ENCOUNTER — Other Ambulatory Visit: Payer: Self-pay

## 2018-05-03 ENCOUNTER — Emergency Department: Payer: Self-pay

## 2018-05-03 ENCOUNTER — Encounter: Payer: Self-pay | Admitting: *Deleted

## 2018-05-03 DIAGNOSIS — E785 Hyperlipidemia, unspecified: Secondary | ICD-10-CM | POA: Diagnosis present

## 2018-05-03 DIAGNOSIS — Z7902 Long term (current) use of antithrombotics/antiplatelets: Secondary | ICD-10-CM

## 2018-05-03 DIAGNOSIS — Z8673 Personal history of transient ischemic attack (TIA), and cerebral infarction without residual deficits: Secondary | ICD-10-CM

## 2018-05-03 DIAGNOSIS — R001 Bradycardia, unspecified: Secondary | ICD-10-CM | POA: Diagnosis present

## 2018-05-03 DIAGNOSIS — I214 Non-ST elevation (NSTEMI) myocardial infarction: Principal | ICD-10-CM

## 2018-05-03 DIAGNOSIS — I10 Essential (primary) hypertension: Secondary | ICD-10-CM | POA: Diagnosis present

## 2018-05-03 DIAGNOSIS — E781 Pure hyperglyceridemia: Secondary | ICD-10-CM | POA: Diagnosis present

## 2018-05-03 DIAGNOSIS — I2510 Atherosclerotic heart disease of native coronary artery without angina pectoris: Secondary | ICD-10-CM | POA: Diagnosis present

## 2018-05-03 DIAGNOSIS — Z79899 Other long term (current) drug therapy: Secondary | ICD-10-CM

## 2018-05-03 DIAGNOSIS — E119 Type 2 diabetes mellitus without complications: Secondary | ICD-10-CM | POA: Diagnosis present

## 2018-05-03 DIAGNOSIS — Z7982 Long term (current) use of aspirin: Secondary | ICD-10-CM

## 2018-05-03 DIAGNOSIS — F17210 Nicotine dependence, cigarettes, uncomplicated: Secondary | ICD-10-CM | POA: Diagnosis present

## 2018-05-03 LAB — PROTIME-INR
INR: 0.88
PROTHROMBIN TIME: 11.9 s (ref 11.4–15.2)

## 2018-05-03 LAB — TROPONIN I
TROPONIN I: 0.06 ng/mL — AB (ref <0.03)
TROPONIN I: 0.27 ng/mL — AB (ref <0.03)
Troponin I: 0.3 ng/mL (ref <0.03)

## 2018-05-03 LAB — CBC
HCT: 43.3 % (ref 39.0–52.0)
HEMOGLOBIN: 13.3 g/dL (ref 13.0–17.0)
MCH: 27.6 pg (ref 26.0–34.0)
MCHC: 30.7 g/dL (ref 30.0–36.0)
MCV: 89.8 fL (ref 80.0–100.0)
Platelets: 220 10*3/uL (ref 150–400)
RBC: 4.82 MIL/uL (ref 4.22–5.81)
RDW: 13.6 % (ref 11.5–15.5)
WBC: 7.8 10*3/uL (ref 4.0–10.5)
nRBC: 0 % (ref 0.0–0.2)

## 2018-05-03 LAB — BASIC METABOLIC PANEL
ANION GAP: 8 (ref 5–15)
BUN: 12 mg/dL (ref 6–20)
CHLORIDE: 106 mmol/L (ref 98–111)
CO2: 27 mmol/L (ref 22–32)
Calcium: 9 mg/dL (ref 8.9–10.3)
Creatinine, Ser: 1.04 mg/dL (ref 0.61–1.24)
GFR calc Af Amer: >60 mL/min (ref >60)
GLUCOSE: 140 mg/dL — AB (ref 70–99)
Potassium: 3.8 mmol/L (ref 3.5–5.1)
Sodium: 141 mmol/L (ref 135–145)

## 2018-05-03 LAB — APTT: APTT: 30 s (ref 24–36)

## 2018-05-03 LAB — GLUCOSE, CAPILLARY: Glucose-Capillary: 120 mg/dL — ABNORMAL HIGH (ref 70–99)

## 2018-05-03 MED ORDER — NICOTINE 21 MG/24HR TD PT24
21.0000 mg | MEDICATED_PATCH | Freq: Every day | TRANSDERMAL | Status: DC
  Administered 2018-05-03 – 2018-05-07 (×5): 21 mg via TRANSDERMAL
  Filled 2018-05-03 (×5): qty 1

## 2018-05-03 MED ORDER — NICOTINE 21 MG/24HR TD PT24: 21.0000 mg | MEDICATED_PATCH | Freq: Every day | TRANSDERMAL | Status: DC

## 2018-05-03 MED ORDER — INSULIN ASPART 100 UNIT/ML ~~LOC~~ SOLN: 0.0000 [IU] | Freq: Every day | SUBCUTANEOUS | Status: DC

## 2018-05-03 MED ORDER — ASPIRIN 300 MG RE SUPP: 300.0000 mg | RECTAL | Status: DC

## 2018-05-03 MED ORDER — ASPIRIN 81 MG PO CHEW
324.0000 mg | CHEWABLE_TABLET | Freq: Once | ORAL | Status: AC
  Administered 2018-05-03: 324 mg via ORAL
  Filled 2018-05-03: qty 4

## 2018-05-03 MED ORDER — HEPARIN BOLUS VIA INFUSION
4000.0000 [IU] | Freq: Once | INTRAVENOUS | Status: AC
  Administered 2018-05-03: 4000 [IU] via INTRAVENOUS
  Filled 2018-05-03: qty 4000

## 2018-05-03 MED ORDER — FENOFIBRATE 160 MG PO TABS
160.0000 mg | ORAL_TABLET | Freq: Every day | ORAL | Status: DC
  Administered 2018-05-03 – 2018-05-07 (×5): 160 mg via ORAL
  Filled 2018-05-03 (×6): qty 1

## 2018-05-03 MED ORDER — ASPIRIN 81 MG PO CHEW: 81.0000 mg | CHEWABLE_TABLET | Freq: Every day | ORAL | Status: DC

## 2018-05-03 MED ORDER — HEPARIN (PORCINE) 25000 UT/250ML-% IV SOLN
1350.0000 [IU]/h | INTRAVENOUS | Status: DC
  Administered 2018-05-03: 1000 [IU]/h via INTRAVENOUS
  Administered 2018-05-04: 1300 [IU]/h via INTRAVENOUS
  Administered 2018-05-06: 1350 [IU]/h via INTRAVENOUS
  Filled 2018-05-03 (×4): qty 250

## 2018-05-03 MED ORDER — SODIUM CHLORIDE 0.9% FLUSH: 3.0000 mL | INTRAVENOUS | Status: DC | PRN

## 2018-05-03 MED ORDER — SODIUM CHLORIDE 0.9 % IV SOLN: 250.0000 mL | INTRAVENOUS | Status: DC | PRN

## 2018-05-03 MED ORDER — ASPIRIN EC 81 MG PO TBEC
81.0000 mg | DELAYED_RELEASE_TABLET | Freq: Every day | ORAL | Status: DC
  Administered 2018-05-04 – 2018-05-07 (×3): 81 mg via ORAL
  Filled 2018-05-03 (×4): qty 1

## 2018-05-03 MED ORDER — ATORVASTATIN CALCIUM 20 MG PO TABS
40.0000 mg | ORAL_TABLET | Freq: Every day | ORAL | Status: DC
  Administered 2018-05-03 – 2018-05-06 (×4): 40 mg via ORAL
  Filled 2018-05-03 (×4): qty 2

## 2018-05-03 MED ORDER — NITROGLYCERIN 0.4 MG SL SUBL
0.4000 mg | SUBLINGUAL_TABLET | SUBLINGUAL | Status: DC | PRN
  Administered 2018-05-05 (×2): 0.4 mg via SUBLINGUAL
  Filled 2018-05-03: qty 1

## 2018-05-03 MED ORDER — ONDANSETRON HCL 4 MG/2ML IJ SOLN: 4.0000 mg | Freq: Four times a day (QID) | INTRAMUSCULAR | Status: DC | PRN

## 2018-05-03 MED ORDER — GUAIFENESIN-DM 100-10 MG/5ML PO SYRP
5.0000 mL | ORAL_SOLUTION | ORAL | Status: DC | PRN
  Administered 2018-05-03 – 2018-05-04 (×2): 5 mL via ORAL
  Filled 2018-05-03 (×2): qty 5

## 2018-05-03 MED ORDER — ACETAMINOPHEN 325 MG PO TABS: 650.0000 mg | ORAL_TABLET | ORAL | Status: DC | PRN

## 2018-05-03 MED ORDER — MELATONIN 5 MG PO TABS
5.0000 mg | ORAL_TABLET | Freq: Every day | ORAL | Status: DC
  Administered 2018-05-03 – 2018-05-06 (×4): 5 mg via ORAL
  Filled 2018-05-03 (×5): qty 1

## 2018-05-03 MED ORDER — SODIUM CHLORIDE 0.9% FLUSH
3.0000 mL | Freq: Two times a day (BID) | INTRAVENOUS | Status: DC
  Administered 2018-05-06 – 2018-05-07 (×3): 3 mL via INTRAVENOUS

## 2018-05-03 MED ORDER — INSULIN ASPART 100 UNIT/ML ~~LOC~~ SOLN: 0.0000 [IU] | Freq: Three times a day (TID) | SUBCUTANEOUS | Status: DC

## 2018-05-03 MED ORDER — LISINOPRIL 5 MG PO TABS
5.0000 mg | ORAL_TABLET | Freq: Every day | ORAL | Status: DC
  Administered 2018-05-03 – 2018-05-06 (×4): 5 mg via ORAL
  Filled 2018-05-03 (×4): qty 1

## 2018-05-03 MED ORDER — CLOPIDOGREL BISULFATE 75 MG PO TABS
75.0000 mg | ORAL_TABLET | Freq: Every day | ORAL | Status: DC
  Administered 2018-05-03 – 2018-05-07 (×5): 75 mg via ORAL
  Filled 2018-05-03 (×6): qty 1

## 2018-05-03 MED ORDER — ASPIRIN 81 MG PO CHEW: 324.0000 mg | CHEWABLE_TABLET | ORAL | Status: DC

## 2018-05-03 NOTE — Consult Note (Signed)
ANTICOAGULATION CONSULT NOTE - Initial Consult  Pharmacy Consult for Heparin Indication: chest pain/ACS  No Known Allergies  Patient Measurements: Height: 5\' 6"  (167.6 cm) Weight: 194 lb 0.4 oz (88 kg) IBW/kg (Calculated) : 63.8 Heparin Dosing Weight: 82.2 kg  Vital Signs: Temp: 98.4 F (36.9 C) (11/22 1338) Temp Source: Oral (11/22 1338) BP: 138/61 (11/22 1600) Pulse Rate: 56 (11/22 1600)  Labs: Recent Labs    05/03/18 1337 05/03/18 1604  HGB 13.3  --   HCT 43.3  --   PLT 220  --   CREATININE 1.04  --   TROPONINI 0.06* 0.27*    Estimated Creatinine Clearance: 82.5 mL/min (by C-G formula based on SCr of 1.04 mg/dL).   Medical History: Past Medical History:  Diagnosis Date  . Acute kidney insufficiency    03/2018: Cr 1.1 in setting of stroke (baseline ~0.9)  . Alcohol abuse   . Cocaine abuse (HCC)    03/2018: Postitive cocaine UA tox screen  . Diabetes (HCC)    03/20/18: A1C 6.4  . Hypertension   . Hypertriglyceridemia    03/20/18 TG 511  . Nicotine abuse   . Stroke [facility](HCC)     Medications:  Scheduled:  . heparin  4,000 Units Intravenous Once    Assessment: 57 yo male admitted on 11/22 with chest pain. Pertinent PMH includes cocaine/alcohol use and stroke. Patient not on any PTA anticoagulation, but is on DAPT outpatient.  Goal of Therapy:  Heparin level 0.3-0.7 units/ml Monitor platelets by anticoagulation protocol: Yes   Plan:  Ordered baseline labs. Ordered heparin bolus of 4000 units, followed by continuous infusion of 1000 units/hr. Will order heparin level for 2300.   Mauri ReadingSavanna M Jamiaya Bina, PharmD Pharmacy Resident  05/03/2018 4:49 PM

## 2018-05-03 NOTE — ED Triage Notes (Signed)
PT to ED via EMS reporting chest pain with tenderness upon palpation and cough. Pt had a TIA in October and is currently out of his BP medications and blood thinners since Sunday. No SOB, dizziness, headache reported and no neuro deficits at this time.   Per EMS:  HR 50 188/100 CBG 128 Tenderness upon palpation to the center of chest. Cough present.

## 2018-05-03 NOTE — ED Notes (Signed)
Date and time results received: 05/03/18 4:37 PM (use smartphrase ".now" to insert current time)  Test: Trop I Critical Value: 0.27  Name of Provider Notified: K.Pad, MD  Orders Received? Or Actions Taken?: No new orders at this time.

## 2018-05-03 NOTE — H&P (Signed)
[facility] - [place] at [facility]   PATIENT NAME: Clinton Knight    MR#:  [id number]  DATE OF BIRTH:  February 28, 1961  DATE OF ADMISSION:  05/03/2018  PRIMARY CARE PHYSICIAN: Patient, No Pcp Per   REQUESTING/REFERRING PHYSICIAN:   CHIEF COMPLAINT:   Chief Complaint  Patient presents with  . Chest Pain    HISTORY OF PRESENT ILLNESS: Clinton Knight  is a 57 y.o. male with a known history of alcohol abuse in the past cocaine use diabetes mellitus hypertension hyperlipidemia tobacco abuse presented to the emergency room for chest pain.  Patient had chest pain this morning located in left-sided chest sharp in nature 5 out of 10 on a scale of 1-10.  No complaints of any fever, cough.  Troponin is been elevated patient started on IV heparin drip for anticoagulation.  Cardiology was consulted and hospitalist service was consulted.  PAST MEDICAL HISTORY:   Past Medical History:  Diagnosis Date  . Acute kidney insufficiency    03/2018: Cr 1.1 in setting of stroke (baseline ~0.9)  . Alcohol abuse   . Cocaine abuse (HCC)    03/2018: Postitive cocaine UA tox screen  . Diabetes (HCC)    03/20/18: A1C 6.4  . Hypertension   . Hypertriglyceridemia    03/20/18 TG 511  . Nicotine abuse   . Stroke [facility])     PAST SURGICAL HISTORY:  Past Surgical History:  Procedure Laterality Date  . NO PAST SURGERIES      SOCIAL HISTORY:  Social History   Tobacco Use  . Smoking status: Current Every Day Smoker    Packs/day: 0.50    Types: Cigarettes  . Smokeless tobacco: Never Used  Substance Use Topics  . Alcohol use: Yes    FAMILY HISTORY:  Family History  Problem Relation Age of Onset  . Diabetes Mother   . Stroke Mother   . Hypertension Mother   . Heart attack Brother 7       Brother, MI, 4 yo  . Thyroid disease Daughter        Removed her entire thyroid    DRUG ALLERGIES: No Known Allergies  REVIEW OF SYSTEMS:   CONSTITUTIONAL: No fever, fatigue or weakness.   EYES: No blurred or double vision.  EARS, NOSE, AND THROAT: No tinnitus or ear pain.  RESPIRATORY: No cough, shortness of breath, wheezing or hemoptysis.  CARDIOVASCULAR: Has chest pain,no orthopnea, edema.  GASTROINTESTINAL: No nausea, vomiting, diarrhea or abdominal pain.  GENITOURINARY: No dysuria, hematuria.  ENDOCRINE: No polyuria, nocturia,  HEMATOLOGY: No anemia, easy bruising or bleeding SKIN: No rash or lesion. MUSCULOSKELETAL: No joint pain or arthritis.   NEUROLOGIC: No tingling, numbness, weakness.  PSYCHIATRY: No anxiety or depression.   MEDICATIONS AT HOME:  Prior to Admission medications   Medication Sig Start Date End Date Taking? Authorizing Provider  acetaminophen (TYLENOL) 325 MG tablet Take 2 tablets (650 mg total) by mouth every 4 (four) hours as needed for mild pain (or temp > 37.5 C (99.5 F)). 03/29/18  Yes Angiulli, Mcarthur Rossetti, PA-C  aspirin 81 MG chewable tablet Chew 1 tablet (81 mg total) by mouth daily. 03/22/18  Yes Auburn Bilberry, MD  atorvastatin (LIPITOR) 40 MG tablet Take 1 tablet (40 mg total) by mouth daily at 6 PM. 03/29/18  Yes Angiulli, Mcarthur Rossetti, PA-C  clopidogrel (PLAVIX) 75 MG tablet Take 1 tablet (75 mg total) by mouth daily. 03/29/18  Yes Angiulli, Mcarthur Rossetti, PA-C  fenofibrate 160 MG tablet Take  1 tablet (160 mg total) by mouth daily. 03/29/18  Yes Angiulli, Mcarthur Rossetti, PA-C  lisinopril (PRINIVIL,ZESTRIL) 5 MG tablet Take 1 tablet (5 mg total) by mouth daily. 03/29/18  Yes Angiulli, Mcarthur Rossetti, PA-C  Melatonin 3 MG TABS Take 1 tablet (3 mg total) by mouth at bedtime. 03/29/18  Yes Angiulli, Mcarthur Rossetti, PA-C  nicotine (NICODERM CQ - DOSED IN MG/24 HOURS) 14 mg/24hr patch 14 mg patch daily x3 weeks then 7 mg patch daily x3 weeks and stop 03/29/18  Yes Angiulli, Mcarthur Rossetti, PA-C      PHYSICAL EXAMINATION:   VITAL SIGNS: Blood pressure (!) 144/93, pulse 67, temperature 98.4 F (36.9 C), temperature source Oral, resp. rate 20, height 5\' 6"  (1.676 m), weight  88 kg, SpO2 98 %.  GENERAL:  57 y.o.-year-old patient lying in the bed with no acute distress.  EYES: Pupils equal, round, reactive to light and accommodation. No scleral icterus. Extraocular muscles intact.  HEENT: Head atraumatic, normocephalic. Oropharynx and nasopharynx clear.  NECK:  Supple, no jugular venous distention. No thyroid enlargement, no tenderness.  LUNGS: Normal breath sounds bilaterally, no wheezing, rales,rhonchi or crepitation. No use of accessory muscles of respiration.  CARDIOVASCULAR: S1, S2 normal. No murmurs, rubs, or gallops.  ABDOMEN: Soft, nontender, nondistended. Bowel sounds present. No organomegaly or mass.  EXTREMITIES: No pedal edema, cyanosis, or clubbing.  NEUROLOGIC: Cranial nerves II through XII are intact. Muscle strength 5/5 in all extremities. Sensation intact. Gait not checked.  PSYCHIATRIC: The patient is alert and oriented x 3.  SKIN: No obvious rash, lesion, or ulcer.   LABORATORY PANEL:   CBC Recent Labs  Lab 05/03/18 1337  WBC 7.8  HGB 13.3  HCT 43.3  PLT 220  MCV 89.8  MCH 27.6  MCHC 30.7  RDW 13.6   ------------------------------------------------------------------------------------------------------------------  Chemistries  Recent Labs  Lab 05/03/18 1337  NA 141  K 3.8  CL 106  CO2 27  GLUCOSE 140*  BUN 12  CREATININE 1.04  CALCIUM 9.0   ------------------------------------------------------------------------------------------------------------------ estimated creatinine clearance is 82.5 mL/min (by C-G formula based on SCr of 1.04 mg/dL). ------------------------------------------------------------------------------------------------------------------ No results for input(s): TSH, T4TOTAL, T3FREE, THYROIDAB in the last 72 hours.  Invalid input(s): FREET3   Coagulation profile No results for input(s): INR, PROTIME in the last 168  hours. ------------------------------------------------------------------------------------------------------------------- No results for input(s): DDIMER in the last 72 hours. -------------------------------------------------------------------------------------------------------------------  Cardiac Enzymes Recent Labs  Lab 05/03/18 1337 05/03/18 1604  TROPONINI 0.06* 0.27*   ------------------------------------------------------------------------------------------------------------------ Invalid input(s): POCBNP  ---------------------------------------------------------------------------------------------------------------  Urinalysis    Component Value Date/Time   COLORURINE YELLOW 03/28/2018 1433   APPEARANCEUR CLEAR 03/28/2018 1433   APPEARANCEUR Clear 01/29/2014 0536   LABSPEC 1.009 03/28/2018 1433   LABSPEC 1.010 01/29/2014 0536   PHURINE 8.0 03/28/2018 1433   GLUCOSEU NEGATIVE 03/28/2018 1433   GLUCOSEU Negative 01/29/2014 0536   HGBUR NEGATIVE 03/28/2018 1433   BILIRUBINUR NEGATIVE 03/28/2018 1433   BILIRUBINUR Negative 01/29/2014 0536   KETONESUR NEGATIVE 03/28/2018 1433   PROTEINUR NEGATIVE 03/28/2018 1433   NITRITE NEGATIVE 03/28/2018 1433   LEUKOCYTESUR NEGATIVE 03/28/2018 1433   LEUKOCYTESUR Negative 01/29/2014 0536     RADIOLOGY: Dg Chest 2 View  Result Date: 05/03/2018 CLINICAL DATA:  57 year old male with history of chest pain and tenderness to palpation in the central aspect of the chest. Cough. EXAM: CHEST - 2 VIEW COMPARISON:  Chest x-ray 04/22/2017. FINDINGS: Lung volumes are normal. No consolidative airspace disease. No pleural effusions. No pneumothorax. No pulmonary nodule or mass noted.  Pulmonary vasculature and the cardiomediastinal silhouette are within normal limits. IMPRESSION: No radiographic evidence of acute cardiopulmonary disease. Electronically Signed   By: Trudie Reedaniel  Entrikin M.D.   On: 05/03/2018 13:53    EKG: Orders placed or performed  during the hospital encounter of 05/03/18  . ED EKG within 10 minutes  . ED EKG within 10 minutes    IMPRESSION AND PLAN: 57 year old male patient well-built history of diabetes mellitus type 2, substance abuse, hypertension, hyperlipidemia presented to the emergency room for chest pain  -Non-STEMI Admit patient to telemetry IV heparin drip for anticoagulation Start patient on aspirin, statin medication Because of bradycardia beta-blocker will be held Cardiology consultation Check echocardiogram Cycle troponin N.p.o. after midnight for possible intervention in morning  -Type 2 diabetes mellitus Diabetic diet with sliding scale coverage with insulin  -Tobacco abuse Tobacco cessation counseled to the patient for 6 minutes Nicotine patch offered  -Hyperlipidemia Resume statin medication All the records are reviewed and case discussed with ED provider. Management plans discussed with the patient, family and they are in agreement.  CODE STATUS:Full code Code Status History    Date Active Date Inactive Code Status Order ID Comments User Context   03/22/2018 1446 03/29/2018 1454 Full Code [id number]  Lynnae Prudengiulli, Daniel J, PA-C Inpatient   03/22/2018 1446 03/22/2018 1446 Full Code [id number]  Charlton Amorngiulli, Daniel J, PA-C Inpatient   03/20/2018 0004 03/22/2018 1410 Full Code [id number]  Oralia ManisWillis, David, MD ED       TOTAL TIME TAKING CARE OF THIS PATIENT: 53 minutes.    Ihor AustinPavan Bobbie Valletta M.D on 05/03/2018 at 5:20 PM  Between 7am to 6pm - Pager - [phone]  After 6pm go to [email or web address] - password [email or web address]  Fabio Ne[facility] [place] [facility]  Office  [phone]  CC: Primary care physician; Patient, No Pcp Per

## 2018-05-03 NOTE — ED Provider Notes (Signed)
[facility] Emergency Department Provider Note  Time seen: 3:17 PM  I have reviewed the triage vital signs and the nursing notes.   HISTORY  Chief Complaint Chest Pain    HPI Clinton Knight is a 57 y.o. male with a past medical history of alcohol abuse, cocaine abuse, diabetes, hypertension, CVA, presents to the emergency department for chest pain.  According to the patient he worked overnight last night, was trying to sleep this morning but developed central chest pain described as a tightness sensation mild to moderate in severity patient denies any nausea shortness of breath or diaphoresis.  Patient does admit to a past use of cocaine, but states it is been close to a year ago since he last used cocaine.  States he had a stroke last month but his only deficit is mild speech difficulties at times.  Denies any new weakness or numbness confusion or new trouble speaking.  Patient denies any chest pain at all currently.  Denies any cough congestion or fever.   Past Medical History:  Diagnosis Date  . Acute kidney insufficiency    03/2018: Cr 1.1 in setting of stroke (baseline ~0.9)  . Alcohol abuse   . Cocaine abuse (HCC)    03/2018: Postitive cocaine UA tox screen  . Diabetes (HCC)    03/20/18: A1C 6.4  . Hypertension   . Hypertriglyceridemia    03/20/18 TG 511  . Nicotine abuse   . Stroke [facility](HCC)     Patient Active Problem List   Diagnosis Date Noted  . CKD (chronic kidney disease), stage II   . Sleep disturbance   . Diabetes mellitus type 2 in obese (HCC)   . Leukocytosis   . Slow transit constipation   . Essential hypertension   . 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 Sinus pause   . Stroke [facility](HCC) 03/19/2018    Past Surgical History:  Procedure Laterality Date  . NO PAST SURGERIES      Prior to Admission medications   Medication Sig Start Date End Date Taking? Authorizing Provider  acetaminophen (TYLENOL) 325 MG tablet Take 2 tablets (650 mg  total) by mouth every 4 (four) hours as needed for mild pain (or temp > 37.5 C (99.5 F)). 03/29/18  Yes Angiulli, Mcarthur Rossettianiel J, PA-C  aspirin 81 MG chewable tablet Chew 1 tablet (81 mg total) by mouth daily. 03/22/18  Yes Auburn BilberryPatel, Shreyang, MD  atorvastatin (LIPITOR) 40 MG tablet Take 1 tablet (40 mg total) by mouth daily at 6 PM. 03/29/18  Yes Angiulli, Mcarthur Rossettianiel J, PA-C  clopidogrel (PLAVIX) 75 MG tablet Take 1 tablet (75 mg total) by mouth daily. 03/29/18  Yes Angiulli, Mcarthur Rossettianiel J, PA-C  fenofibrate 160 MG tablet Take 1 tablet (160 mg total) by mouth daily. 03/29/18  Yes Angiulli, Mcarthur Rossettianiel J, PA-C  lisinopril (PRINIVIL,ZESTRIL) 5 MG tablet Take 1 tablet (5 mg total) by mouth daily. 03/29/18  Yes Angiulli, Mcarthur Rossettianiel J, PA-C  Melatonin 3 MG TABS Take 1 tablet (3 mg total) by mouth at bedtime. 03/29/18  Yes Angiulli, Mcarthur Rossettianiel J, PA-C  nicotine (NICODERM CQ - DOSED IN MG/24 HOURS) 14 mg/24hr patch 14 mg patch daily x3 weeks then 7 mg patch daily x3 weeks and stop 03/29/18  Yes Angiulli, Mcarthur Rossettianiel J, PA-C    No Known Allergies  Family History  Problem Relation Age of Onset  . Diabetes Mother   . Stroke Mother   . Hypertension Mother   . Heart attack Brother 40  Brother, MI, 57 yo  . Thyroid disease Daughter        Removed her entire thyroid    Social History Social History   Tobacco Use  . Smoking status: Current Every Day Smoker    Packs/day: 0.50    Types: Cigarettes  . Smokeless tobacco: Never Used  Substance Use Topics  . Alcohol use: Yes  . Drug use: Yes    Types: Cocaine    Review of Systems Constitutional: Negative for fever. Cardiovascular: Positive for chest pain this morning, since resolved Respiratory: Negative for shortness of breath. Gastrointestinal: Negative for abdominal pain, vomiting  Genitourinary: Negative for urinary compaints Musculoskeletal: Negative for leg pain or swelling Skin: Negative for skin complaints  Neurological: Negative for headache All other ROS  negative  ____________________________________________   PHYSICAL EXAM:  VITAL SIGNS: ED Triage Vitals  Enc Vitals Group     BP 05/03/18 1338 122/73     Pulse Rate 05/03/18 1338 (!) 59     Resp 05/03/18 1338 16     Temp 05/03/18 1338 98.4 F (36.9 C)     Temp Source 05/03/18 1338 Oral     SpO2 05/03/18 1338 99 %     Weight 05/03/18 1319 194 lb 0.4 oz (88 kg)     Height 05/03/18 1319 5\' 6"  (1.676 m)     Head Circumference --      Peak Flow --      Pain Score 05/03/18 1318 5     Pain Loc --      Pain Edu? --      Excl. in GC? --    Constitutional: Alert and oriented. Well appearing and in no distress. Eyes: Normal exam ENT   Head: Normocephalic and atraumatic   Mouth/Throat: Mucous membranes are moist. Cardiovascular: Normal rate, regular rhythm. No murmur Respiratory: Normal respiratory effort without tachypnea nor retractions. Breath sounds are clear  Gastrointestinal: Soft and nontender. No distention.  Musculoskeletal: Nontender with normal range of motion in all extremities. No lower extremity tenderness or edema. Neurologic:  Normal speech and language. No gross focal neurologic deficits Skin:  Skin is warm, dry and intact.  Psychiatric: Mood and affect are normal.  ____________________________________________    EKG  EKG reviewed and interpreted by myself shows sinus bradycardia 55 bpm with a narrow QRS, normal axis, normal intervals, no concerning ST changes.  Reassuring EKG.  ____________________________________________    RADIOLOGY  Chest x-ray is negative  ____________________________________________   INITIAL IMPRESSION / ASSESSMENT AND PLAN / ED COURSE  Pertinent labs & imaging results that were available during my care of the patient were reviewed by me and considered in my medical decision making (see chart for details).  Patient presents to the emergency department for chest pain since this morning, which is since resolved.  Denies any  shortness of breath nausea or diaphoresis at any point.  Overall the patient appears well, does admit to a past use of cocaine but states it is been close to 1 year since he last used cocaine.  I reviewed patient's labs his troponin is elevated 0.06, his past troponins have been negative.  No renal dysfunction.  Given his elevated troponin with complaint of chest pain earlier this morning I advised the patient we need to admit him to the hospital for further work-up.  Patient is adamantly against being admitted to the hospital.  Is asking for something to eat, we will provide the patient food he is agreeable to stay for a second  troponin, but states very clearly that regardless he will not be admitted to the hospital.  I did discuss with the patient that this could be due to heart damage or heart attack which could ultimately be life-threatening, patient understands and still wishes to go home.  He is agreeable to stay for the second troponin.  If the second troponin elevated significantly we will try once again to keep the patient in the hospital.  I reviewed the patient's records on 03/20/2018 the patient did test positive for cocaine, this would raise the concern for ongoing cocaine usage and possible cocaine induced vasospasm/MI.  Patient's repeat troponin is elevated 0.27.  I had a long discussion with the patient, he is agreeable to stay in the hospital.  We will start the patient on a heparin infusion.  We will discuss with cardiology.  Patient will be admitted to the hospitalist service.   CRITICAL CARE Performed by: Minna Antis   Total critical care time: 30 minutes  Critical care time was exclusive of separately billable procedures and treating other patients.  Critical care was necessary to treat or prevent imminent or life-threatening deterioration.  Critical care was time spent personally by me on the following activities: development of treatment plan with patient and/or surrogate  as well as nursing, discussions with consultants, evaluation of patient's response to treatment, examination of patient, obtaining history from patient or surrogate, ordering and performing treatments and interventions, ordering and review of laboratory studies, ordering and review of radiographic studies, pulse oximetry and re-evaluation of patient's condition.  ____________________________________________   FINAL CLINICAL IMPRESSION(S) / ED DIAGNOSES  Chest pain NSTEMI   Minna Antis, MD 05/03/18 1642

## 2018-05-03 NOTE — Care Management Note (Signed)
Case Management Note  Patient Details  Name: Clinton Knight MRN: [id number] Date of Birth: [birth year]  Subjective/Objective:      Patient is being seen in the ED for chest pain.  Patient is from home, lives with his sister.  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  Patient reports that he has no current PCP.  He has not been taking his medications since Sunday, he states he cannot afford them.  RNCM provided [facility] and [facility], and will make a referral to both.  List of other indigent clinics given to patient and a list of [place] [facility] including housing information and transportation.  Patient reports he does not drive he gets rides from people when he needs to, [facility] informed [facility] of [facility] and [facility].  RNCM will follow to see if any needs arise for discharge Robbie LisJeanna Tanika Bracco RN BSN Case Manager [phone]                Action/Plan:   Expected Discharge Date:                  Expected Discharge Plan:     In-House Referral:     Discharge planning Services     Post Acute Care Choice:    Choice offered to:     DME Arranged:    DME Agency:     HH Arranged:    HH Agency:     Status of Service:     If discussed at Long Length of Stay Meetings, dates discussed:    Additional Comments:  Allayne ButcherJeanna M Lakyia Behe, RN 05/03/2018, 3:49 PM

## 2018-05-03 NOTE — Progress Notes (Signed)
Advanced care plan.  Purpose of the Encounter: CODE STATUS  Parties in Attendance: Patient  Patient's Decision Capacity: Good  Subjective/Patient's story: Presented to emergency room for chest pain   Objective/Medical story Patient has elevated troponin and needs cardiology evaluation intervention and treatment   Goals of care determination:  Advance care directives and goals of care and treatment plan discussed Patient wants everything done which includes CPR, intubation ventilator if the need arises   CODE STATUS: Full code   Time spent discussing advanced care planning: 16 minutes

## 2018-05-03 NOTE — ED Notes (Signed)
Discussed risks of leaving w/o admission. Pt verbalized understanding that he was risking disability and death. Pt encouraged to return if recurring or worsening sxs. Pt's family present during portions of the discussion/disclosure. Family encouraged pt to remain in hospital. Pt became increasingly truculent and resistant to further evaluation and treatment. Family left shortly afterward.

## 2018-05-03 NOTE — Telephone Encounter (Signed)
Noted. Follow up as needed  Clinton Knight

## 2018-05-04 ENCOUNTER — Inpatient Hospital Stay: Admit: 2018-05-04 | Payer: Self-pay

## 2018-05-04 DIAGNOSIS — I214 Non-ST elevation (NSTEMI) myocardial infarction: Principal | ICD-10-CM

## 2018-05-04 LAB — TROPONIN I
TROPONIN I: 0.23 ng/mL — AB (ref <0.03)
Troponin I: 0.37 ng/mL (ref <0.03)

## 2018-05-04 LAB — HEPARIN LEVEL (UNFRACTIONATED)
HEPARIN UNFRACTIONATED: 0.48 [IU]/mL (ref 0.30–0.70)
Heparin Unfractionated: 0.24 IU/mL — ABNORMAL LOW (ref 0.30–0.70)
Heparin Unfractionated: 0.25 IU/mL — ABNORMAL LOW (ref 0.30–0.70)

## 2018-05-04 LAB — URINE DRUG SCREEN, QUALITATIVE (ARMC ONLY)
Amphetamines, Ur Screen: NOT DETECTED
BARBITURATES, UR SCREEN: NOT DETECTED
BENZODIAZEPINE, UR SCRN: NOT DETECTED
Cannabinoid 50 Ng, Ur ~~LOC~~: NOT DETECTED
Cocaine Metabolite,Ur ~~LOC~~: NOT DETECTED
MDMA (Ecstasy)Ur Screen: NOT DETECTED
Methadone Scn, Ur: NOT DETECTED
OPIATE, UR SCREEN: NOT DETECTED
Phencyclidine (PCP) Ur S: NOT DETECTED
Tricyclic, Ur Screen: NOT DETECTED

## 2018-05-04 LAB — GLUCOSE, CAPILLARY
GLUCOSE-CAPILLARY: 127 mg/dL — AB (ref 70–99)
GLUCOSE-CAPILLARY: 104 mg/dL — AB (ref 70–99)
GLUCOSE-CAPILLARY: 98 mg/dL (ref 70–99)
Glucose-Capillary: 137 mg/dL — ABNORMAL HIGH (ref 70–99)

## 2018-05-04 LAB — BASIC METABOLIC PANEL
ANION GAP: 6 (ref 5–15)
BUN: 13 mg/dL (ref 6–20)
CHLORIDE: 108 mmol/L (ref 98–111)
CO2: 25 mmol/L (ref 22–32)
Calcium: 8.6 mg/dL — ABNORMAL LOW (ref 8.9–10.3)
Creatinine, Ser: 1.03 mg/dL (ref 0.61–1.24)
GFR calc Af Amer: >60 mL/min (ref >60)
Glucose, Bld: 129 mg/dL — ABNORMAL HIGH (ref 70–99)
POTASSIUM: 4.2 mmol/L (ref 3.5–5.1)
SODIUM: 139 mmol/L (ref 135–145)

## 2018-05-04 LAB — CBC
HEMATOCRIT: 41.8 % (ref 39.0–52.0)
Hemoglobin: 13 g/dL (ref 13.0–17.0)
MCH: 27.7 pg (ref 26.0–34.0)
MCHC: 31.1 g/dL (ref 30.0–36.0)
MCV: 89.1 fL (ref 80.0–100.0)
Platelets: 233 10*3/uL (ref 150–400)
RBC: 4.69 MIL/uL (ref 4.22–5.81)
RDW: 13.8 % (ref 11.5–15.5)
WBC: 9.1 10*3/uL (ref 4.0–10.5)
nRBC: 0 % (ref 0.0–0.2)

## 2018-05-04 LAB — LIPID PANEL
CHOL/HDL RATIO: 3.5 ratio
Cholesterol: 110 mg/dL (ref 0–200)
HDL: 31 mg/dL — AB (ref >40)
LDL Cholesterol: 56 mg/dL (ref 0–99)
TRIGLYCERIDES: 114 mg/dL (ref <150)
VLDL: 23 mg/dL (ref 0–40)

## 2018-05-04 MED ORDER — HEPARIN BOLUS VIA INFUSION
1200.0000 [IU] | Freq: Once | INTRAVENOUS | Status: AC
  Administered 2018-05-04: 1200 [IU] via INTRAVENOUS
  Filled 2018-05-04: qty 1200

## 2018-05-04 MED ORDER — HYDROCOD POLST-CPM POLST ER 10-8 MG/5ML PO SUER
5.0000 mL | Freq: Two times a day (BID) | ORAL | Status: DC | PRN
  Administered 2018-05-04 – 2018-05-05 (×3): 5 mL via ORAL
  Filled 2018-05-04 (×3): qty 5

## 2018-05-04 NOTE — Consult Note (Signed)
ANTICOAGULATION CONSULT NOTE   Pharmacy Consult for Heparin Indication: chest pain/ACS  No Known Allergies  Patient Measurements: Height: 5\' 6"  (167.6 cm) Weight: 192 lb 9.6 oz (87.4 kg) IBW/kg (Calculated) : 63.8 Heparin Dosing Weight: 82.2 kg  Vital Signs: Temp: 98.3 F (36.8 C) (11/23 1623) Temp Source: Oral (11/23 1623) BP: 129/79 (11/23 1623) Pulse Rate: 59 (11/23 1623)  Labs: Recent Labs    05/03/18 1337  05/03/18 1644 05/03/18 1829 05/03/18 2324 05/04/18 0633 05/04/18 1558  HGB 13.3  --   --   --   --  13.0  --   HCT 43.3  --   --   --   --  41.8  --   PLT 220  --   --   --   --  233  --   APTT  --   --  30  --   --   --   --   LABPROT  --   --  11.9  --   --   --   --   INR  --   --  0.88  --   --   --   --   HEPARINUNFRC  --   --   --   --  0.24* 0.25* 0.48  CREATININE 1.04  --   --   --   --  1.03  --   TROPONINI 0.06*   < >  --  0.30* 0.37* 0.23*  --    < > = values in this interval not displayed.    Estimated Creatinine Clearance: 82.9 mL/min (by C-G formula based on SCr of 1.03 mg/dL).   Medical History: Past Medical History:  Diagnosis Date  . Acute kidney insufficiency    03/2018: Cr 1.1 in setting of stroke (baseline ~0.9)  . Alcohol abuse   . Cocaine abuse (HCC)    03/2018: Postitive cocaine UA tox screen  . Diabetes (HCC)    03/20/18: A1C 6.4  . Hypertension   . Hypertriglyceridemia    03/20/18 TG 511  . Nicotine abuse   . Stroke [facility](HCC)     Medications:  Scheduled:  . aspirin EC  81 mg Oral Daily  . atorvastatin  40 mg Oral q1800  . clopidogrel  75 mg Oral Daily  . fenofibrate  160 mg Oral Daily  . insulin aspart  0-15 Units Subcutaneous TID WC  . insulin aspart  0-5 Units Subcutaneous QHS  . lisinopril  5 mg Oral Daily  . Melatonin  5 mg Oral QHS  . nicotine  21 mg Transdermal Daily  . sodium chloride flush  3 mL Intravenous Q12H    Assessment: 57 yo male admitted on 11/22 with chest pain. Pertinent PMH includes  cocaine/alcohol use and stroke. Patient not on any PTA anticoagulation, but is on DAPT outpatient. Hgb is stable  Heparin Course 11/22 initiation 4000unit bolus, then 1000 units/hr 11/22 2324 HL 0.24 bolus 1200 units, increase to 1150 units/hr 11/23 0633 HL 0.25  Goal of Therapy:  Heparin level 0.3-0.7 units/ml Monitor platelets by anticoagulation protocol: Yes   Plan:  11/23 1558 HL= 0.48. Will continue current rate of 1300 units/hr and check confirmatory level in 6 hours.  Bari MantisKristin Raheim Beutler PharmD Clinical Pharmacist 05/04/2018

## 2018-05-04 NOTE — Consult Note (Signed)
ANTICOAGULATION CONSULT NOTE - Initial Consult  Pharmacy Consult for Heparin Indication: chest pain/ACS  No Known Allergies  Patient Measurements: Height: 5\' 6"  (167.6 cm) Weight: 192 lb 9.6 oz (87.4 kg) IBW/kg (Calculated) : 63.8 Heparin Dosing Weight: 82.2 kg  Vital Signs: Temp: 98.5 F (36.9 C) (11/22 1953) Temp Source: Oral (11/22 1953) BP: 152/65 (11/22 1953) Pulse Rate: 61 (11/22 1953)  Labs: Recent Labs    05/03/18 1337 05/03/18 1604 05/03/18 1644 05/03/18 1829 05/03/18 2324  HGB 13.3  --   --   --   --   HCT 43.3  --   --   --   --   PLT 220  --   --   --   --   APTT  --   --  30  --   --   LABPROT  --   --  11.9  --   --   INR  --   --  0.88  --   --   HEPARINUNFRC  --   --   --   --  0.24*  CREATININE 1.04  --   --   --   --   TROPONINI 0.06* 0.27*  --  0.30* 0.37*    Estimated Creatinine Clearance: 82.1 mL/min (by C-G formula based on SCr of 1.04 mg/dL).   Medical History: Past Medical History:  Diagnosis Date  . Acute kidney insufficiency    03/2018: Cr 1.1 in setting of stroke (baseline ~0.9)  . Alcohol abuse   . Cocaine abuse (HCC)    03/2018: Postitive cocaine UA tox screen  . Diabetes (HCC)    03/20/18: A1C 6.4  . Hypertension   . Hypertriglyceridemia    03/20/18 TG 511  . Nicotine abuse   . Stroke [facility](HCC)     Medications:  Scheduled:  . aspirin EC  81 mg Oral Daily  . atorvastatin  40 mg Oral q1800  . clopidogrel  75 mg Oral Daily  . fenofibrate  160 mg Oral Daily  . heparin  1,200 Units Intravenous Once  . insulin aspart  0-15 Units Subcutaneous TID WC  . insulin aspart  0-5 Units Subcutaneous QHS  . lisinopril  5 mg Oral Daily  . Melatonin  5 mg Oral QHS  . nicotine  21 mg Transdermal Daily  . sodium chloride flush  3 mL Intravenous Q12H    Assessment: 57 yo male admitted on 11/22 with chest pain. Pertinent PMH includes cocaine/alcohol use and stroke. Patient not on any PTA anticoagulation, but is on DAPT outpatient.  Goal of  Therapy:  Heparin level 0.3-0.7 units/ml Monitor platelets by anticoagulation protocol: Yes   Plan:  11/22 @ 2300 HL 0.24 subtherapeutic. Will rebolus w/ heparin 1200 units IV x 1 and increase rate to 1150 units/hr and will recheck HL @ 0700.  Thomasene Rippleavid Kylar Leonhardt, PharmD, BCPS Clinical Pharmacist 05/04/2018

## 2018-05-04 NOTE — Clinical Social Work Note (Signed)
CSW received consult for "other" with no indication as to needs. The patient has a consult placed for [facility] for medication assistance. The CSW is signing off. Please consult should CSW needs arise.  Clinton Knight, MSW, Theresia MajorsLCSWA [phone]

## 2018-05-04 NOTE — Progress Notes (Signed)
Patient ID: Clinton Knight, male   DOB: [birth year], 57 y.o.   MRN: [id number]  [employer] PROGRESS NOTE  Clinton Knight NWG:[id number] DOB: Jan 01, 1961 DOA: 05/03/2018 PCP: Patient, No Pcp Per  HPI/Subjective: Patient feels fine right now.  Had chest pain and shortness of breath when he came in.  Objective: Vitals:   05/04/18 0355 05/04/18 0806  BP: 131/73 135/73  Pulse: (!) 52 (!) 55  Resp:  20  Temp: 98 F (36.7 C) 98 F (36.7 C)  SpO2: 100% 97%    Filed Weights   05/03/18 1319 05/03/18 1754  Weight: 88 kg 87.4 kg    ROS: Review of Systems  Constitutional: Negative for chills and fever.  Eyes: Negative for blurred vision.  Respiratory: Negative for cough and shortness of breath.   Cardiovascular: Negative for chest pain.  Gastrointestinal: Negative for abdominal pain, constipation, diarrhea, nausea and vomiting.  Genitourinary: Negative for dysuria.  Musculoskeletal: Negative for joint pain.  Neurological: Negative for dizziness and headaches.   Exam: Physical Exam  HENT:  Nose: No mucosal edema.  Mouth/Throat: No oropharyngeal exudate or posterior oropharyngeal edema.  Eyes: Pupils are equal, round, and reactive to light. Conjunctivae, EOM and lids are normal.  Neck: No JVD present. Carotid bruit is not present. No edema present. No thyroid mass and no thyromegaly present.  Cardiovascular: S1 normal and S2 normal. Exam reveals no gallop.  No murmur heard. Pulses:      Dorsalis pedis pulses are 2+ on the right side, and 2+ on the left side.  Respiratory: No respiratory distress. He has no wheezes. He has no rhonchi. He has no rales.  GI: Soft. Bowel sounds are normal. There is no tenderness.  Musculoskeletal:       Right ankle: He exhibits no swelling.       Left ankle: He exhibits no swelling.  Lymphadenopathy:    He has no cervical adenopathy.  Neurological: He is alert. No cranial nerve deficit.  Skin: Skin is warm. No rash noted. Nails show no  clubbing.  Psychiatric: He has a normal mood and affect.      Data Reviewed: Basic Metabolic Panel: Recent Labs  Lab 05/03/18 1337 05/04/18 0633  NA 141 139  K 3.8 4.2  CL 106 108  CO2 27 25  GLUCOSE 140* 129*  BUN 12 13  CREATININE 1.04 1.03  CALCIUM 9.0 8.6*   CBC: Recent Labs  Lab 05/03/18 1337 05/04/18 0633  WBC 7.8 9.1  HGB 13.3 13.0  HCT 43.3 41.8  MCV 89.8 89.1  PLT 220 233   Cardiac Enzymes: Recent Labs  Lab 05/03/18 1337 05/03/18 1604 05/03/18 1829 05/03/18 2324 05/04/18 0633  TROPONINI 0.06* 0.27* 0.30* 0.37* 0.23*   CBG: Recent Labs  Lab 05/03/18 1957 05/04/18 0834 05/04/18 1128  GLUCAP 120* 127* 104*    Studies: Dg Chest 2 View  Result Date: 05/03/2018 CLINICAL DATA:  57 year old male with history of chest pain and tenderness to palpation in the central aspect of the chest. Cough. EXAM: CHEST - 2 VIEW COMPARISON:  Chest x-ray 04/22/2017. FINDINGS: Lung volumes are normal. No consolidative airspace disease. No pleural effusions. No pneumothorax. No pulmonary nodule or mass noted. Pulmonary vasculature and the cardiomediastinal silhouette are within normal limits. IMPRESSION: No radiographic evidence of acute cardiopulmonary disease. Electronically Signed   By: Trudie Reed M.D.   On: 05/03/2018 13:53    Scheduled Meds: . aspirin EC  81 mg Oral Daily  . atorvastatin  40 mg  Oral q1800  . clopidogrel  75 mg Oral Daily  . fenofibrate  160 mg Oral Daily  . insulin aspart  0-15 Units Subcutaneous TID WC  . insulin aspart  0-5 Units Subcutaneous QHS  . lisinopril  5 mg Oral Daily  . Melatonin  5 mg Oral QHS  . nicotine  21 mg Transdermal Daily  . sodium chloride flush  3 mL Intravenous Q12H   Continuous Infusions: . sodium chloride    . heparin 1,300 Units/hr (05/04/18 0953)    Assessment/Plan:  1. NSTEMI.  Heparin drip, aspirin, Plavix, Lipitor.  With bradycardia no beta-blocker ordered.  Cardiac catheterization on Monday 2. Type  2 diabetes mellitus.  On sliding scale insulin.  Last A1c 6.4.  Diet control. 3. Hyperlipidemia and hypertriglyceridemia on atorvastatin and fenofibrate 4. Hypertension on lisinopril. 5. Recent stroke on aspirin and Plavix.  Patient went to acute inpatient rehab after that. 6. Tobacco abuse on nicotine patch  Code Status:     Code Status Orders  (From admission, onward)         Start     Ordered   05/03/18 1755  Full code  Continuous     05/03/18 1754        Code Status History    Date Active Date Inactive Code Status Order ID Comments User Context   03/22/2018 1446 03/29/2018 1454 Full Code [id number]  Charlton Amorngiulli, Daniel J, PA-C Inpatient   03/22/2018 1446 03/22/2018 1446 Full Code [id number]  Charlton Amorngiulli, Daniel J, PA-C Inpatient   03/20/2018 0004 03/22/2018 1410 Full Code [id number]  Oralia ManisWillis, David, MD ED      Disposition Plan: Potential discharge Monday night if cardiac cath negative or Tuesday if a stent placed on Monday  [facility]  [facility]  Time spent: 28 minutes  Elazar Argabright [employer]  [employer]

## 2018-05-04 NOTE — Consult Note (Signed)
ANTICOAGULATION CONSULT NOTE - Initial Consult  Pharmacy Consult for Heparin Indication: chest pain/ACS  No Known Allergies  Patient Measurements: Height: 5\' 6"  (167.6 cm) Weight: 192 lb 9.6 oz (87.4 kg) IBW/kg (Calculated) : 63.8 Heparin Dosing Weight: 82.2 kg  Vital Signs: Temp: 98 F (36.7 C) (11/23 0355) Temp Source: Oral (11/22 1953) BP: 131/73 (11/23 0355) Pulse Rate: 52 (11/23 0355)  Labs: Recent Labs    05/03/18 1337 05/03/18 1604 05/03/18 1644 05/03/18 1829 05/03/18 2324 05/04/18 0633  HGB 13.3  --   --   --   --  13.0  HCT 43.3  --   --   --   --  41.8  PLT 220  --   --   --   --  233  APTT  --   --  30  --   --   --   LABPROT  --   --  11.9  --   --   --   INR  --   --  0.88  --   --   --   HEPARINUNFRC  --   --   --   --  0.24* 0.25*  CREATININE 1.04  --   --   --   --   --   TROPONINI 0.06* 0.27*  --  0.30* 0.37*  --     Estimated Creatinine Clearance: 82.1 mL/min (by C-G formula based on SCr of 1.04 mg/dL).   Medical History: Past Medical History:  Diagnosis Date  . Acute kidney insufficiency    03/2018: Cr 1.1 in setting of stroke (baseline ~0.9)  . Alcohol abuse   . Cocaine abuse (HCC)    03/2018: Postitive cocaine UA tox screen  . Diabetes (HCC)    03/20/18: A1C 6.4  . Hypertension   . Hypertriglyceridemia    03/20/18 TG 511  . Nicotine abuse   . Stroke [facility](HCC)     Medications:  Scheduled:  . aspirin EC  81 mg Oral Daily  . atorvastatin  40 mg Oral q1800  . clopidogrel  75 mg Oral Daily  . fenofibrate  160 mg Oral Daily  . insulin aspart  0-15 Units Subcutaneous TID WC  . insulin aspart  0-5 Units Subcutaneous QHS  . lisinopril  5 mg Oral Daily  . Melatonin  5 mg Oral QHS  . nicotine  21 mg Transdermal Daily  . sodium chloride flush  3 mL Intravenous Q12H    Assessment: 57 yo male admitted on 11/22 with chest pain. Pertinent PMH includes cocaine/alcohol use and stroke. Patient not on any PTA anticoagulation, but is on DAPT  outpatient. Hgb is stable  Heparin Course 11/22 initiation 4000unit bolus, then 1000 units/hr 11/22 2324 HL 0.24 bolus 1200 units, increase to 1150 units/hr 11/23 0633 HL 0.25  Goal of Therapy:  Heparin level 0.3-0.7 units/ml Monitor platelets by anticoagulation protocol: Yes   Plan:  Will rebolus w/ heparin 1200 units IV x 1 and increase rate to 1300 units/hr and will recheck HL at 1600.  Burnis Medinodney Kendrell Lottman, PharmD Clinical Pharmacist 05/04/2018

## 2018-05-04 NOTE — Consult Note (Signed)
Cardiology Consultation:   Patient ID: Clinton Knight MRN: [id number]; DOB: 12-02-1960  Admit date: 05/03/2018 Date of Consult: 05/04/2018  Primary Care Provider: Patient, No Pcp Per Primary Cardiologist: Julien Nordmann, MD  Primary Electrophysiologist:  None    Patient Profile:   Clinton Knight is a 57 y.o. male with a hx of CVA, prior substance abuse, hypertension, hyperlipidemia who is being seen today for the evaluation of NSTEMI at the request of Dr. Tobi Bastos.  History of Present Illness:   Clinton Knight is a 57 y.o. male with a hx of CVA, prior substance abuse, hypertension, hyperlipidemia who is being seen today for the evaluation of NSTEMI at the request of Dr. Tobi Bastos.  Yesterday morning, he noted tightness/squeezing chest pain on the left side, 5/10. No nausea, diaphoresis, shortness of breath. Chest pain has since resolved. His troponins were elevated and rose to a peak of 0.37 and are now downtrending.  He notes that he has not used any cocaine since his stroke in October. Consents to urine drug screen.  We discussed management of NSTEMI at length, as he initially did not wish to remain in hospital. However, after discussion he is amenable to stay for cath on Monday, 11/25.  Past Medical History:  Diagnosis Date  . Acute kidney insufficiency    03/2018: Cr 1.1 in setting of stroke (baseline ~0.9)  . Alcohol abuse   . Cocaine abuse (HCC)    03/2018: Postitive cocaine UA tox screen  . Diabetes (HCC)    03/20/18: A1C 6.4  . Hypertension   . Hypertriglyceridemia    03/20/18 TG 511  . Nicotine abuse   . Stroke [facility])     Past Surgical History:  Procedure Laterality Date  . NO PAST SURGERIES       Home Medications:  Prior to Admission medications   Medication Sig Start Date End Date Taking? Authorizing Provider  acetaminophen (TYLENOL) 325 MG tablet Take 2 tablets (650 mg total) by mouth every 4 (four) hours as needed for mild pain (or temp > 37.5 C (99.5 F)).  03/29/18  Yes Angiulli, Mcarthur Rossetti, PA-C  aspirin 81 MG chewable tablet Chew 1 tablet (81 mg total) by mouth daily. 03/22/18  Yes Auburn Bilberry, MD  atorvastatin (LIPITOR) 40 MG tablet Take 1 tablet (40 mg total) by mouth daily at 6 PM. 03/29/18  Yes Angiulli, Mcarthur Rossetti, PA-C  clopidogrel (PLAVIX) 75 MG tablet Take 1 tablet (75 mg total) by mouth daily. 03/29/18  Yes Angiulli, Mcarthur Rossetti, PA-C  fenofibrate 160 MG tablet Take 1 tablet (160 mg total) by mouth daily. 03/29/18  Yes Angiulli, Mcarthur Rossetti, PA-C  lisinopril (PRINIVIL,ZESTRIL) 5 MG tablet Take 1 tablet (5 mg total) by mouth daily. 03/29/18  Yes Angiulli, Mcarthur Rossetti, PA-C  Melatonin 3 MG TABS Take 1 tablet (3 mg total) by mouth at bedtime. 03/29/18  Yes Angiulli, Mcarthur Rossetti, PA-C  nicotine (NICODERM CQ - DOSED IN MG/24 HOURS) 14 mg/24hr patch 14 mg patch daily x3 weeks then 7 mg patch daily x3 weeks and stop 03/29/18  Yes Angiulli, Mcarthur Rossetti, PA-C    Inpatient Medications: Scheduled Meds: . aspirin EC  81 mg Oral Daily  . atorvastatin  40 mg Oral q1800  . clopidogrel  75 mg Oral Daily  . fenofibrate  160 mg Oral Daily  . insulin aspart  0-15 Units Subcutaneous TID WC  . insulin aspart  0-5 Units Subcutaneous QHS  . lisinopril  5 mg Oral Daily  . Melatonin  5 mg Oral QHS  . nicotine  21 mg Transdermal Daily  . sodium chloride flush  3 mL Intravenous Q12H   Continuous Infusions: . sodium chloride    . heparin 1,300 Units/hr (05/04/18 0953)   PRN Meds: sodium chloride, acetaminophen, guaiFENesin-dextromethorphan, nitroGLYCERIN, ondansetron (ZOFRAN) IV, sodium chloride flush  Allergies:   No Known Allergies  Social History:   Social History   Socioeconomic History  . Marital status: Single    Spouse name: Not on file  . Number of children: Not on file  . Years of education: Not on file  . Highest education level: Not on file  Occupational History  . Not on file  Social Needs  . Financial resource strain: Not on file  . Food  insecurity:    Worry: Not on file    Inability: Not on file  . Transportation needs:    Medical: Not on file    Non-medical: Not on file  Tobacco Use  . Smoking status: Current Every Day Smoker    Packs/day: 0.50    Types: Cigarettes  . Smokeless tobacco: Never Used  Substance and Sexual Activity  . Alcohol use: Yes  . Drug use: Yes    Types: Cocaine  . Sexual activity: Not on file  Lifestyle  . Physical activity:    Days per week: Not on file    Minutes per session: Not on file  . Stress: Not on file  Relationships  . Social connections:    Talks on phone: Not on file    Gets together: Not on file    Attends religious service: Not on file    Active member of club or organization: Not on file    Attends meetings of clubs or organizations: Not on file    Relationship status: Not on file  . Intimate partner violence:    Fear of current or ex partner: Not on file    Emotionally abused: Not on file    Physically abused: Not on file    Forced sexual activity: Not on file  Other Topics Concern  . Not on file  Social History Narrative  . Not on file    Family History:    Family History  Problem Relation Age of Onset  . Diabetes Mother   . Stroke Mother   . Hypertension Mother   . Heart attack Brother 12       Brother, MI, 25 yo  . Thyroid disease Daughter        Removed her entire thyroid     ROS:  Please see the history of present illness.  Review of Systems - General ROS: negative for - chills, fatigue, fever or malaise ENT ROS: negative for - hearing change or vertigo Respiratory ROS: no cough, shortness of breath, or wheezing Cardiovascular ROS: positive for - chest pain Gastrointestinal ROS: no abdominal pain, change in bowel habits, or black or bloody stools Genito-Urinary ROS: no dysuria, trouble voiding, or hematuria Musculoskeletal ROS: negative for - muscle pain or muscular weakness Neurological ROS: no TIA or stroke symptoms All other ROS reviewed and  negative.     Physical Exam/Data:   Vitals:   05/03/18 1754 05/03/18 1953 05/04/18 0355 05/04/18 0806  BP: 140/76 (!) 152/65 131/73 135/73  Pulse: 61 61 (!) 52 (!) 55  Resp:    20  Temp: 98.3 F (36.8 C) 98.5 F (36.9 C) 98 F (36.7 C) 98 F (36.7 C)  TempSrc: Oral Oral  Oral  SpO2:  100% 99% 100% 97%  Weight: 87.4 kg     Height: 5\' 6"  (1.676 m)       Intake/Output Summary (Last 24 hours) at 05/04/2018 1112 Last data filed at 05/04/2018 1000 Gross per 24 hour  Intake 451 ml  Output 1100 ml  Net -649 ml   Filed Weights   05/03/18 1319 05/03/18 1754  Weight: 88 kg 87.4 kg   Body mass index is 31.09 kg/m.  General:  Well nourished, well developed, in no acute distress HEENT: normal Lymph: no adenopathy Neck: no JVD Endocrine:  No thryomegaly Vascular: No carotid bruits; RA pulses 2+ bilaterally Cardiac:  normal S1, S2; RRR; no murmur Lungs:  clear to auscultation bilaterally, no wheezing, rhonchi or rales  Abd: soft, nontender, no hepatomegaly  Ext: no edema Musculoskeletal:  No deformities, BUE and BLE strength normal and equal Skin: warm and dry  Neuro:  CNs 2-12 intact, no focal abnormalities noted Psych:  Normal affect   EKG:  The EKG was personally reviewed and demonstrates:  Sinus bradycardia Telemetry:  Telemetry was personally reviewed and demonstrates:  Sinus rhythm  Relevant CV Studies: Updated echo pending  Echo 03/20/18 Study Conclusions  - Left ventricle: The cavity size was normal. There was moderate   concentric hypertrophy. Systolic function was normal. The   estimated ejection fraction was in the range of 60% to 65%. Wall   motion was normal; there were no regional wall motion   abnormalities. Left ventricular diastolic function parameters   were normal.  Impressions:  - No cardiac source of emboli was indentified.  Laboratory Data:  Chemistry Recent Labs  Lab 05/03/18 1337 05/04/18 0633  NA 141 139  K 3.8 4.2  CL 106 108    CO2 27 25  GLUCOSE 140* 129*  BUN 12 13  CREATININE 1.04 1.03  CALCIUM 9.0 8.6*  GFRNONAA >60 >60  GFRAA >60 >60  ANIONGAP 8 6    No results for input(s): PROT, ALBUMIN, AST, ALT, ALKPHOS, BILITOT in the last 168 hours. Hematology Recent Labs  Lab 05/03/18 1337 05/04/18 0633  WBC 7.8 9.1  RBC 4.82 4.69  HGB 13.3 13.0  HCT 43.3 41.8  MCV 89.8 89.1  MCH 27.6 27.7  MCHC 30.7 31.1  RDW 13.6 13.8  PLT 220 233   Cardiac Enzymes Recent Labs  Lab 05/03/18 1337 05/03/18 1604 05/03/18 1829 05/03/18 2324 05/04/18 0633  TROPONINI 0.06* 0.27* 0.30* 0.37* 0.23*   No results for input(s): TROPIPOC in the last 168 hours.  BNPNo results for input(s): BNP, PROBNP in the last 168 hours.  DDimer No results for input(s): DDIMER in the last 168 hours.  Radiology/Studies:  Dg Chest 2 View  Result Date: 05/03/2018 CLINICAL DATA:  57 year old male with history of chest pain and tenderness to palpation in the central aspect of the chest. Cough. EXAM: CHEST - 2 VIEW COMPARISON:  Chest x-ray 04/22/2017. FINDINGS: Lung volumes are normal. No consolidative airspace disease. No pleural effusions. No pneumothorax. No pulmonary nodule or mass noted. Pulmonary vasculature and the cardiomediastinal silhouette are within normal limits. IMPRESSION: No radiographic evidence of acute cardiopulmonary disease. Electronically Signed   By: Trudie Reedaniel  Entrikin M.D.   On: 05/03/2018 13:53    Assessment and Plan:   1. NSTEMI -already on aspirin and clopidogrel given recent CVA, continue -on heparin drip -has been started on atorvastatin 40 mg, should be on high intensity statin with LDL goal <70 -echo pending -no plans for cath today, started heart health diet.  Plan for NPO at midnight on 11/24 for cath 11/25 -on lisinopril 5 mg. Has had bradycardia, monitor and will consider beta blocker if resting heart rate allows -tobacco cessation -denies diabetes history to me, last A1c 6.4 -cardiac rehab after  cath -denies recent cocaine use, urine drug screen ordered.  Jodelle Red, MD, PhD [facility]   For questions or updates, please contact [facility] Please consult [email or web address] for contact info under   Signed, Jodelle Red, MD  05/04/2018 11:12 AM

## 2018-05-05 LAB — CBC
HCT: 40.9 % (ref 39.0–52.0)
HEMOGLOBIN: 12.8 g/dL — AB (ref 13.0–17.0)
MCH: 27.9 pg (ref 26.0–34.0)
MCHC: 31.3 g/dL (ref 30.0–36.0)
MCV: 89.1 fL (ref 80.0–100.0)
Platelets: 237 10*3/uL (ref 150–400)
RBC: 4.59 MIL/uL (ref 4.22–5.81)
RDW: 13.5 % (ref 11.5–15.5)
WBC: 9.1 10*3/uL (ref 4.0–10.5)
nRBC: 0 % (ref 0.0–0.2)

## 2018-05-05 LAB — GLUCOSE, CAPILLARY
GLUCOSE-CAPILLARY: 112 mg/dL — AB (ref 70–99)
Glucose-Capillary: 131 mg/dL — ABNORMAL HIGH (ref 70–99)
Glucose-Capillary: 121 mg/dL — ABNORMAL HIGH (ref 70–99)

## 2018-05-05 LAB — HEPARIN LEVEL (UNFRACTIONATED)
HEPARIN UNFRACTIONATED: 0.31 [IU]/mL (ref 0.30–0.70)
Heparin Unfractionated: 0.37 IU/mL (ref 0.30–0.70)
Heparin Unfractionated: 0.47 IU/mL (ref 0.30–0.70)

## 2018-05-05 MED ORDER — SODIUM CHLORIDE 0.9% FLUSH: 3.0000 mL | INTRAVENOUS | Status: DC | PRN

## 2018-05-05 MED ORDER — SODIUM CHLORIDE 0.9 % WEIGHT BASED INFUSION
3.0000 mL/kg/h | INTRAVENOUS | Status: DC
  Administered 2018-05-06: 3 mL/kg/h via INTRAVENOUS

## 2018-05-05 MED ORDER — ASPIRIN 81 MG PO CHEW
81.0000 mg | CHEWABLE_TABLET | ORAL | Status: AC
  Administered 2018-05-06: 81 mg via ORAL
  Filled 2018-05-05: qty 1

## 2018-05-05 MED ORDER — SODIUM CHLORIDE 0.9 % WEIGHT BASED INFUSION
1.0000 mL/kg/h | INTRAVENOUS | Status: DC
  Administered 2018-05-06 (×2): 1 mL/kg/h via INTRAVENOUS

## 2018-05-05 MED ORDER — SODIUM CHLORIDE 0.9 % IV SOLN: 250.0000 mL | INTRAVENOUS | Status: DC | PRN

## 2018-05-05 MED ORDER — SODIUM CHLORIDE 0.9% FLUSH
3.0000 mL | Freq: Two times a day (BID) | INTRAVENOUS | Status: DC
  Administered 2018-05-05 – 2018-05-06 (×2): 3 mL via INTRAVENOUS

## 2018-05-05 NOTE — Consult Note (Signed)
ANTICOAGULATION CONSULT NOTE   Pharmacy Consult for Heparin Indication: chest pain/ACS  No Known Allergies  Patient Measurements: Height: 5\' 6"  (167.6 cm) Weight: 192 lb 9.6 oz (87.4 kg) IBW/kg (Calculated) : 63.8 Heparin Dosing Weight: 82.2 kg  Vital Signs: Temp: 97.9 F (36.6 C) (11/24 0829) Temp Source: Oral (11/24 0829) BP: 121/71 (11/24 0829) Pulse Rate: 52 (11/24 0829)  Labs: Recent Labs    05/03/18 1337  05/03/18 1644 05/03/18 1829  05/03/18 2324 05/04/18 2595  05/04/18 2146 05/05/18 0428 05/05/18 1055  HGB 13.3  --   --   --   --   --  13.0  --   --  12.8*  --   HCT 43.3  --   --   --   --   --  41.8  --   --  40.9  --   PLT 220  --   --   --   --   --  233  --   --  237  --   APTT  --   --  30  --   --   --   --   --   --   --   --   LABPROT  --   --  11.9  --   --   --   --   --   --   --   --   INR  --   --  0.88  --   --   --   --   --   --   --   --   HEPARINUNFRC  --   --   --   --    < > 0.24* 0.25*   < > 0.37 0.31 0.47  CREATININE 1.04  --   --   --   --   --  1.03  --   --   --   --   TROPONINI 0.06*   < >  --  0.30*  --  0.37* 0.23*  --   --   --   --    < > = values in this interval not displayed.    Estimated Creatinine Clearance: 82.9 mL/min (by C-G formula based on SCr of 1.03 mg/dL).   Medical History: Past Medical History:  Diagnosis Date  . Acute kidney insufficiency    03/2018: Cr 1.1 in setting of stroke (baseline ~0.9)  . Alcohol abuse   . Cocaine abuse (HCC)    03/2018: Postitive cocaine UA tox screen  . Diabetes (HCC)    03/20/18: A1C 6.4  . Hypertension   . Hypertriglyceridemia    03/20/18 TG 511  . Nicotine abuse   . Stroke [facility])     Medications:  Scheduled:  . aspirin EC  81 mg Oral Daily  . atorvastatin  40 mg Oral q1800  . clopidogrel  75 mg Oral Daily  . fenofibrate  160 mg Oral Daily  . insulin aspart  0-15 Units Subcutaneous TID WC  . insulin aspart  0-5 Units Subcutaneous QHS  . lisinopril  5 mg Oral Daily  .  Melatonin  5 mg Oral QHS  . nicotine  21 mg Transdermal Daily  . sodium chloride flush  3 mL Intravenous Q12H    Assessment: 57 yo male admitted on 11/22 with chest pain. Pertinent PMH includes cocaine/alcohol use and stroke. Patient not on any PTA anticoagulation, but is on DAPT outpatient. Hgb is stable  Heparin Course 11/22  initiation 4000unit bolus, then 1000 units/hr 11/22 2324 HL 0.24 bolus 1200 units, increase to 1150 units/hr 11/23 0633 HL 0.25 11/23 0428 HL 0.31  Inc to 1350 units/hr  Goal of Therapy:  Heparin level 0.3-0.7 units/ml Monitor platelets by anticoagulation protocol: Yes   Plan:  11/23 @1055  HL 0.47. Therapeutic, will f/u HL with am labs.  Bari MantisKristin Delores Edelstein PharmD Clinical Pharmacist 05/05/2018

## 2018-05-05 NOTE — Progress Notes (Signed)
Progress Note  Patient Name: Clinton ArtistVince E Knight Date of Encounter: 05/05/2018  Primary Cardiologist: Julien Nordmannimothy Gollan, MD   Subjective   Doing well overall. Had some chest pain overnight, improved with treatment. Amenable to cath tomorrow. Concerned about cost.  Inpatient Medications    Scheduled Meds: . aspirin EC  81 mg Oral Daily  . atorvastatin  40 mg Oral q1800  . clopidogrel  75 mg Oral Daily  . fenofibrate  160 mg Oral Daily  . insulin aspart  0-15 Units Subcutaneous TID WC  . insulin aspart  0-5 Units Subcutaneous QHS  . lisinopril  5 mg Oral Daily  . Melatonin  5 mg Oral QHS  . nicotine  21 mg Transdermal Daily  . sodium chloride flush  3 mL Intravenous Q12H   Continuous Infusions: . sodium chloride    . heparin 1,350 Units/hr (05/05/18 0725)   PRN Meds: sodium chloride, acetaminophen, chlorpheniramine-HYDROcodone, nitroGLYCERIN, ondansetron (ZOFRAN) IV, sodium chloride flush   Vital Signs    Vitals:   05/05/18 0325 05/05/18 0333 05/05/18 0503 05/05/18 0829  BP: (!) 107/59 107/71 116/77 121/71  Pulse:  61 (!) 53 (!) 52  Resp:   20 20  Temp:   97.8 F (36.6 C) 97.9 F (36.6 C)  TempSrc:    Oral  SpO2:   100% 99%  Weight:      Height:        Intake/Output Summary (Last 24 hours) at 05/05/2018 1148 Last data filed at 05/05/2018 1017 Gross per 24 hour  Intake 794.17 ml  Output 3050 ml  Net -2255.83 ml   Filed Weights   05/03/18 1319 05/03/18 1754  Weight: 88 kg 87.4 kg    Telemetry    Sinus bradycardia - Personally Reviewed  ECG    None since 11/22 - Personally Reviewed  Physical Exam   GEN: No acute distress.   Neck: No JVD Cardiac: RRR, no murmurs, rubs, or gallops.  Respiratory: Clear to auscultation bilaterally. GI: Soft, nontender, non-distended  MS: No edema; No deformity. Neuro:  Nonfocal  Psych: Normal affect   Labs    Chemistry Recent Labs  Lab 05/03/18 1337 05/04/18 0633  NA 141 139  K 3.8 4.2  CL 106 108  CO2 27 25    GLUCOSE 140* 129*  BUN 12 13  CREATININE 1.04 1.03  CALCIUM 9.0 8.6*  GFRNONAA >60 >60  GFRAA >60 >60  ANIONGAP 8 6     Hematology Recent Labs  Lab 05/03/18 1337 05/04/18 0633 05/05/18 0428  WBC 7.8 9.1 9.1  RBC 4.82 4.69 4.59  HGB 13.3 13.0 12.8*  HCT 43.3 41.8 40.9  MCV 89.8 89.1 89.1  MCH 27.6 27.7 27.9  MCHC 30.7 31.1 31.3  RDW 13.6 13.8 13.5  PLT 220 233 237    Cardiac Enzymes Recent Labs  Lab 05/03/18 1604 05/03/18 1829 05/03/18 2324 05/04/18 0633  TROPONINI 0.27* 0.30* 0.37* 0.23*   No results for input(s): TROPIPOC in the last 168 hours.   BNPNo results for input(s): BNP, PROBNP in the last 168 hours.   DDimer No results for input(s): DDIMER in the last 168 hours.   Radiology    Dg Chest 2 View  Result Date: 05/03/2018 CLINICAL DATA:  57 year old male with history of chest pain and tenderness to palpation in the central aspect of the chest. Cough. EXAM: CHEST - 2 VIEW COMPARISON:  Chest x-ray 04/22/2017. FINDINGS: Lung volumes are normal. No consolidative airspace disease. No pleural effusions. No pneumothorax. No  pulmonary nodule or mass noted. Pulmonary vasculature and the cardiomediastinal silhouette are within normal limits. IMPRESSION: No radiographic evidence of acute cardiopulmonary disease. Electronically Signed   By: Trudie Reed M.D.   On: 05/03/2018 13:53    Cardiac Studies   Echo was ordered this hospitalization, ?canceled vs. Pending  Prior: 03/20/18 Study Conclusions  - Left ventricle: The cavity size was normal. There was moderate   concentric hypertrophy. Systolic function was normal. The   estimated ejection fraction was in the range of 60% to 65%. Wall   motion was normal; there were no regional wall motion   abnormalities. Left ventricular diastolic function parameters   were normal.  Impressions:  - No cardiac source of emboli was indentified.  Patient Profile     57 y.o. male with a hx of CVA, prior substance  abuse, hypertension, hyperlipidemia who is being followed for the evaluation of NSTEMI at the request of Dr. Tobi Bastos.  Assessment & Plan    1. NSTEMI -already on aspirin and clopidogrel given recent CVA, continue -on heparin drip -has been started on atorvastatin 40 mg, should be on high intensity statin with LDL goal <70 -echo pending -cath tomorrow Risks and benefits of cardiac catheterization have been discussed with the patient.  These include bleeding, infection, kidney damage, stroke, heart attack, death.  The patient understands these risks and is willing to proceed.  -on lisinopril 5 mg. Has had bradycardia, monitor and will consider beta blocker if resting heart rate allows -tobacco cessation -denies diabetes history to me, last A1c 6.4 -cardiac rehab after cath, though he does not think he will be able to afford this -denies recent cocaine use, urine drug screen ordered and shows no illicits in his system -he would like to talk to case worker about financial assistance  For questions or updates, please contact [facility] Please consult [email or web address] for contact info under     Signed, Jodelle Red, MD  05/05/2018, 11:48 AM

## 2018-05-05 NOTE — Progress Notes (Signed)
Patient ID: Clinton ArtistVince E Bramlett, male   DOB: Sep 08, 1960, 57 y.o.   MRN: [id number]   [employer] PROGRESS NOTE  Clinton Knight [id number] DOB: Sep 08, 1960 DOA: 05/03/2018 PCP: Patient, No Pcp Per  HPI/Subjective: Patient feels fine right now.  Patient had chest pains last night and required a nitroglycerin.  Objective: Vitals:   05/05/18 0503 05/05/18 0829  BP: 116/77 121/71  Pulse: (!) 53 (!) 52  Resp: 20 20  Temp: 97.8 F (36.6 C) 97.9 F (36.6 C)  SpO2: 100% 99%    Filed Weights   05/03/18 1319 05/03/18 1754  Weight: 88 kg 87.4 kg    ROS: Review of Systems  Constitutional: Negative for chills and fever.  Eyes: Negative for blurred vision.  Respiratory: Negative for cough and shortness of breath.   Cardiovascular: Negative for chest pain.  Gastrointestinal: Negative for abdominal pain, constipation, diarrhea, nausea and vomiting.  Genitourinary: Negative for dysuria.  Musculoskeletal: Negative for joint pain.  Neurological: Negative for dizziness and headaches.   Exam: Physical Exam  HENT:  Nose: No mucosal edema.  Mouth/Throat: No oropharyngeal exudate or posterior oropharyngeal edema.  Eyes: Pupils are equal, round, and reactive to light. Conjunctivae, EOM and lids are normal.  Neck: No JVD present. Carotid bruit is not present. No edema present. No thyroid mass and no thyromegaly present.  Cardiovascular: S1 normal and S2 normal. Exam reveals no gallop.  No murmur heard. Pulses:      Dorsalis pedis pulses are 2+ on the right side, and 2+ on the left side.  Respiratory: No respiratory distress. He has no wheezes. He has no rhonchi. He has no rales.  GI: Soft. Bowel sounds are normal. There is no tenderness.  Musculoskeletal:       Right ankle: He exhibits no swelling.       Left ankle: He exhibits no swelling.  Lymphadenopathy:    He has no cervical adenopathy.  Neurological: He is alert. No cranial nerve deficit.  Skin: Skin is warm. No rash noted. Nails  show no clubbing.  Psychiatric: He has a normal mood and affect.      Data Reviewed: Basic Metabolic Panel: Recent Labs  Lab 05/03/18 1337 05/04/18 0633  NA 141 139  K 3.8 4.2  CL 106 108  CO2 27 25  GLUCOSE 140* 129*  BUN 12 13  CREATININE 1.04 1.03  CALCIUM 9.0 8.6*   CBC: Recent Labs  Lab 05/03/18 1337 05/04/18 0633 05/05/18 0428  WBC 7.8 9.1 9.1  HGB 13.3 13.0 12.8*  HCT 43.3 41.8 40.9  MCV 89.8 89.1 89.1  PLT 220 233 237   Cardiac Enzymes: Recent Labs  Lab 05/03/18 1337 05/03/18 1604 05/03/18 1829 05/03/18 2324 05/04/18 0633  TROPONINI 0.06* 0.27* 0.30* 0.37* 0.23*   CBG: Recent Labs  Lab 05/04/18 1128 05/04/18 1624 05/04/18 2117 05/05/18 0830 05/05/18 1134  GLUCAP 104* 98 137* 131* 121*     Scheduled Meds: . [START ON 05/06/2018] aspirin  81 mg Oral Pre-Cath  . aspirin EC  81 mg Oral Daily  . atorvastatin  40 mg Oral q1800  . clopidogrel  75 mg Oral Daily  . fenofibrate  160 mg Oral Daily  . lisinopril  5 mg Oral Daily  . Melatonin  5 mg Oral QHS  . nicotine  21 mg Transdermal Daily  . sodium chloride flush  3 mL Intravenous Q12H  . sodium chloride flush  3 mL Intravenous Q12H   Continuous Infusions: . sodium chloride    .  sodium chloride    . [START ON 05/06/2018] sodium chloride     Followed by  . [START ON 05/06/2018] sodium chloride    . heparin 1,350 Units/hr (05/05/18 0725)    Assessment/Plan:  1. NSTEMI.  Heparin drip, aspirin, Plavix, Lipitor.  With bradycardia no beta-blocker ordered.  Cardiac catheterization on Monday 2. Hyperlipidemia and hypertriglyceridemia on atorvastatin and fenofibrate 3. Hypertension on lisinopril. 4. Recent stroke on aspirin and Plavix.  Patient went to acute inpatient rehab after that. 5. Tobacco abuse on nicotine patch 6. Impaired fasting glucose.  Last hemoglobin A1c 6.4.  Patient not a diabetic at this point.  Code Status:     Code Status Orders  (From admission, onward)          Start     Ordered   05/03/18 1755  Full code  Continuous     05/03/18 1754        Code Status History    Date Active Date Inactive Code Status Order ID Comments User Context   03/22/2018 1446 03/29/2018 1454 Full Code [id number]  Lynnae Prude Inpatient   03/22/2018 1446 03/22/2018 1446 Full Code [id number]  Charlton Amor, PA-C Inpatient   03/20/2018 0004 03/22/2018 1410 Full Code [id number]  Oralia Manis, MD ED      Disposition Plan: Potential discharge Monday night if cardiac cath negative or Tuesday if a stent placed on Monday  Consultants:  [facility] cardiology  Time spent: 26 minutes  Terria Deschepper [employer]

## 2018-05-05 NOTE — Consult Note (Signed)
ANTICOAGULATION CONSULT NOTE   Pharmacy Consult for Heparin Indication: chest pain/ACS  No Known Allergies  Patient Measurements: Height: 5\' 6"  (167.6 cm) Weight: 192 lb 9.6 oz (87.4 kg) IBW/kg (Calculated) : 63.8 Heparin Dosing Weight: 82.2 kg  Vital Signs: Temp: 98.4 F (36.9 C) (11/23 1941) Temp Source: Oral (11/23 1941) BP: 120/75 (11/23 1941) Pulse Rate: 69 (11/23 1941)  Labs: Recent Labs    05/03/18 1337  05/03/18 1644 05/03/18 1829  05/03/18 2324 05/04/18 0633 05/04/18 1558 05/04/18 2146  HGB 13.3  --   --   --   --   --  13.0  --   --   HCT 43.3  --   --   --   --   --  41.8  --   --   PLT 220  --   --   --   --   --  233  --   --   APTT  --   --  30  --   --   --   --   --   --   LABPROT  --   --  11.9  --   --   --   --   --   --   INR  --   --  0.88  --   --   --   --   --   --   HEPARINUNFRC  --   --   --   --    < > 0.24* 0.25* 0.48 0.37  CREATININE 1.04  --   --   --   --   --  1.03  --   --   TROPONINI 0.06*   < >  --  0.30*  --  0.37* 0.23*  --   --    < > = values in this interval not displayed.    Estimated Creatinine Clearance: 82.9 mL/min (by C-G formula based on SCr of 1.03 mg/dL).   Medical History: Past Medical History:  Diagnosis Date  . Acute kidney insufficiency    03/2018: Cr 1.1 in setting of stroke (baseline ~0.9)  . Alcohol abuse   . Cocaine abuse (HCC)    03/2018: Postitive cocaine UA tox screen  . Diabetes (HCC)    03/20/18: A1C 6.4  . Hypertension   . Hypertriglyceridemia    03/20/18 TG 511  . Nicotine abuse   . Stroke [facility](HCC)     Medications:  Scheduled:  . aspirin EC  81 mg Oral Daily  . atorvastatin  40 mg Oral q1800  . clopidogrel  75 mg Oral Daily  . fenofibrate  160 mg Oral Daily  . insulin aspart  0-15 Units Subcutaneous TID WC  . insulin aspart  0-5 Units Subcutaneous QHS  . lisinopril  5 mg Oral Daily  . Melatonin  5 mg Oral QHS  . nicotine  21 mg Transdermal Daily  . sodium chloride flush  3 mL Intravenous  Q12H    Assessment: 57 yo male admitted on 11/22 with chest pain. Pertinent PMH includes cocaine/alcohol use and stroke. Patient not on any PTA anticoagulation, but is on DAPT outpatient. Hgb is stable  Heparin Course 11/22 initiation 4000unit bolus, then 1000 units/hr 11/22 2324 HL 0.24 bolus 1200 units, increase to 1150 units/hr 11/23 0633 HL 0.25  Goal of Therapy:  Heparin level 0.3-0.7 units/ml Monitor platelets by anticoagulation protocol: Yes   Plan:  11/23 @ HL 0.34 therapeutic. Will continue current rate and  will recheck HL w/ am labs. CBC stable.   Thomasene Ripple, PharmD, BCPS Clinical Pharmacist 05/05/2018

## 2018-05-05 NOTE — Consult Note (Signed)
ANTICOAGULATION CONSULT NOTE   Pharmacy Consult for Heparin Indication: chest pain/ACS  No Known Allergies  Patient Measurements: Height: 5\' 6"  (167.6 cm) Weight: 192 lb 9.6 oz (87.4 kg) IBW/kg (Calculated) : 63.8 Heparin Dosing Weight: 82.2 kg  Vital Signs: Temp: 97.8 F (36.6 C) (11/24 0503) Temp Source: Oral (11/24 0316) BP: 116/77 (11/24 0503) Pulse Rate: 53 (11/24 0503)  Labs: Recent Labs    05/03/18 1337  05/03/18 1644 05/03/18 1829  05/03/18 2324 05/04/18 0633 05/04/18 1558 05/04/18 2146 05/05/18 0428  HGB 13.3  --   --   --   --   --  13.0  --   --  12.8*  HCT 43.3  --   --   --   --   --  41.8  --   --  40.9  PLT 220  --   --   --   --   --  233  --   --  237  APTT  --   --  30  --   --   --   --   --   --   --   LABPROT  --   --  11.9  --   --   --   --   --   --   --   INR  --   --  0.88  --   --   --   --   --   --   --   HEPARINUNFRC  --   --   --   --    < > 0.24* 0.25* 0.48 0.37 0.31  CREATININE 1.04  --   --   --   --   --  1.03  --   --   --   TROPONINI 0.06*   < >  --  0.30*  --  0.37* 0.23*  --   --   --    < > = values in this interval not displayed.    Estimated Creatinine Clearance: 82.9 mL/min (by C-G formula based on SCr of 1.03 mg/dL).   Medical History: Past Medical History:  Diagnosis Date  . Acute kidney insufficiency    03/2018: Cr 1.1 in setting of stroke (baseline ~0.9)  . Alcohol abuse   . Cocaine abuse (HCC)    03/2018: Postitive cocaine UA tox screen  . Diabetes (HCC)    03/20/18: A1C 6.4  . Hypertension   . Hypertriglyceridemia    03/20/18 TG 511  . Nicotine abuse   . Stroke [facility](HCC)     Medications:  Scheduled:  . aspirin EC  81 mg Oral Daily  . atorvastatin  40 mg Oral q1800  . clopidogrel  75 mg Oral Daily  . fenofibrate  160 mg Oral Daily  . insulin aspart  0-15 Units Subcutaneous TID WC  . insulin aspart  0-5 Units Subcutaneous QHS  . lisinopril  5 mg Oral Daily  . Melatonin  5 mg Oral QHS  . nicotine  21 mg  Transdermal Daily  . sodium chloride flush  3 mL Intravenous Q12H    Assessment: 57 yo male admitted on 11/22 with chest pain. Pertinent PMH includes cocaine/alcohol use and stroke. Patient not on any PTA anticoagulation, but is on DAPT outpatient. Hgb is stable  Heparin Course 11/22 initiation 4000unit bolus, then 1000 units/hr 11/22 2324 HL 0.24 bolus 1200 units, increase to 1150 units/hr 11/23 0633 HL 0.25  Goal of Therapy:  Heparin level 0.3-0.7  units/ml Monitor platelets by anticoagulation protocol: Yes   Plan:  11/23 @ HL 0.31 therapeutic trending down. Will increase slightly to 1350 and will recheck HL @ 1100. CBC stable will continue to monitor.   Thomasene Ripple, PharmD, BCPS Clinical Pharmacist 05/05/2018

## 2018-05-06 ENCOUNTER — Encounter
Admission: EM | Disposition: A | Discharge status: Home / Self Care | Payer: Self-pay | Source: Home / Self Care | Attending: Internal Medicine

## 2018-05-06 ENCOUNTER — Telehealth: Payer: Self-pay | Admitting: Pharmacy Technician

## 2018-05-06 HISTORY — PX: LEFT HEART CATH AND CORONARY ANGIOGRAPHY: CATH118249

## 2018-05-06 LAB — CBC
HCT: 44.9 % (ref 39.0–52.0)
Hemoglobin: 13.7 g/dL (ref 13.0–17.0)
MCH: 27.5 pg (ref 26.0–34.0)
MCHC: 30.5 g/dL (ref 30.0–36.0)
MCV: 90.2 fL (ref 80.0–100.0)
PLATELETS: 252 10*3/uL (ref 150–400)
RBC: 4.98 MIL/uL (ref 4.22–5.81)
RDW: 13.8 % (ref 11.5–15.5)
WBC: 9.1 10*3/uL (ref 4.0–10.5)
nRBC: 0 % (ref 0.0–0.2)

## 2018-05-06 LAB — HEPARIN LEVEL (UNFRACTIONATED): Heparin Unfractionated: 0.51 IU/mL (ref 0.30–0.70)

## 2018-05-06 SURGERY — LEFT HEART CATH AND CORONARY ANGIOGRAPHY
Anesthesia: Moderate Sedation

## 2018-05-06 MED ORDER — FENTANYL CITRATE (PF) 100 MCG/2ML IJ SOLN
INTRAMUSCULAR | Status: AC
  Filled 2018-05-06: qty 2

## 2018-05-06 MED ORDER — HEPARIN (PORCINE) IN NACL 1000-0.9 UT/500ML-% IV SOLN
INTRAVENOUS | Status: AC
  Filled 2018-05-06: qty 1000

## 2018-05-06 MED ORDER — VERAPAMIL HCL 2.5 MG/ML IV SOLN
INTRAVENOUS | Status: AC
  Filled 2018-05-06: qty 2

## 2018-05-06 MED ORDER — IOPAMIDOL (ISOVUE-300) INJECTION 61%
INTRAVENOUS | Status: DC | PRN
  Administered 2018-05-06: 90 mL via INTRA_ARTERIAL

## 2018-05-06 MED ORDER — NITROGLYCERIN 1 MG/10 ML FOR IR/CATH LAB
INTRA_ARTERIAL | Status: DC | PRN
  Administered 2018-05-06: 200 ug via INTRACORONARY

## 2018-05-06 MED ORDER — MIDAZOLAM HCL 2 MG/2ML IJ SOLN
INTRAMUSCULAR | Status: AC
  Filled 2018-05-06: qty 2

## 2018-05-06 MED ORDER — MIDAZOLAM HCL 2 MG/2ML IJ SOLN
INTRAMUSCULAR | Status: DC | PRN
  Administered 2018-05-06: 1 mg via INTRAVENOUS

## 2018-05-06 MED ORDER — NITROGLYCERIN 5 MG/ML IV SOLN
INTRAVENOUS | Status: AC
  Filled 2018-05-06: qty 10

## 2018-05-06 MED ORDER — VERAPAMIL HCL 2.5 MG/ML IV SOLN
INTRAVENOUS | Status: DC | PRN
  Administered 2018-05-06: 2.5 mg via INTRAVENOUS

## 2018-05-06 MED ORDER — HEPARIN SODIUM (PORCINE) 1000 UNIT/ML IJ SOLN
INTRAMUSCULAR | Status: AC
  Filled 2018-05-06: qty 1

## 2018-05-06 MED ORDER — SODIUM CHLORIDE 0.9 % IV SOLN
INTRAVENOUS | Status: AC | PRN
  Administered 2018-05-06: 999 mL/h via INTRAVENOUS

## 2018-05-06 MED ORDER — FENTANYL CITRATE (PF) 100 MCG/2ML IJ SOLN
INTRAMUSCULAR | Status: DC | PRN
  Administered 2018-05-06: 50 ug via INTRAVENOUS

## 2018-05-06 SURGICAL SUPPLY — 6 items
CATH INFINITI 5FR JK (CATHETERS) ×3 IMPLANT
DEVICE RAD TR BAND REGULAR (VASCULAR PRODUCTS) ×3 IMPLANT
GLIDESHEATH SLEND SS 6F .021 (SHEATH) ×3 IMPLANT
KIT MANI 3VAL PERCEP (MISCELLANEOUS) ×3 IMPLANT
PACK CARDIAC CATH (CUSTOM PROCEDURE TRAY) ×3 IMPLANT
WIRE ROSEN-J .035X260CM (WIRE) ×3 IMPLANT

## 2018-05-06 NOTE — Telephone Encounter (Signed)
Provided patient with new patient packet to obtain [facility] services.  [facility] in order to receive medication assistance.  Sherilyn DacostaBetty J. Abbott Jasinski Care Manager [facility]

## 2018-05-06 NOTE — Consult Note (Signed)
ANTICOAGULATION CONSULT NOTE   Pharmacy Consult for Heparin Indication: chest pain/ACS  No Known Allergies  Patient Measurements: Height: 5\' 6"  (167.6 cm) Weight: 192 lb 9.6 oz (87.4 kg) IBW/kg (Calculated) : 63.8 Heparin Dosing Weight: 82.2 kg  Vital Signs: Temp: 98.3 F (36.8 C) (11/25 0437) Temp Source: Oral (11/24 1946) BP: 102/68 (11/25 0437) Pulse Rate: 52 (11/25 0437)  Labs: Recent Labs    05/03/18 1337  05/03/18 1644 05/03/18 1829  05/03/18 2324 05/04/18 [id number]  05/05/18 0428 05/05/18 1055 05/06/18 0430  HGB 13.3  --   --   --   --   --  13.0  --  12.8*  --  13.7  HCT 43.3  --   --   --   --   --  41.8  --  40.9  --  44.9  PLT 220  --   --   --   --   --  233  --  237  --  252  APTT  --   --  30  --   --   --   --   --   --   --   --   LABPROT  --   --  11.9  --   --   --   --   --   --   --   --   INR  --   --  0.88  --   --   --   --   --   --   --   --   HEPARINUNFRC  --   --   --   --    < > 0.24* 0.25*   < > 0.31 0.47 0.51  CREATININE 1.04  --   --   --   --   --  1.03  --   --   --   --   TROPONINI 0.06*   < >  --  0.30*  --  0.37* 0.23*  --   --   --   --    < > = values in this interval not displayed.    Estimated Creatinine Clearance: 82.9 mL/min (by C-G formula based on SCr of 1.03 mg/dL).   Medical History: Past Medical History:  Diagnosis Date  . Acute kidney insufficiency    03/2018: Cr 1.1 in setting of stroke (baseline ~0.9)  . Alcohol abuse   . Cocaine abuse (HCC)    03/2018: Postitive cocaine UA tox screen  . Diabetes (HCC)    03/20/18: A1C 6.4  . Hypertension   . Hypertriglyceridemia    03/20/18 TG 511  . Nicotine abuse   . Stroke [facility](HCC)     Medications:  Scheduled:  . aspirin  81 mg Oral Pre-Cath  . aspirin EC  81 mg Oral Daily  . atorvastatin  40 mg Oral q1800  . clopidogrel  75 mg Oral Daily  . fenofibrate  160 mg Oral Daily  . lisinopril  5 mg Oral Daily  . Melatonin  5 mg Oral QHS  . nicotine  21 mg Transdermal Daily   . sodium chloride flush  3 mL Intravenous Q12H  . sodium chloride flush  3 mL Intravenous Q12H    Assessment: 57 yo male admitted on 11/22 with chest pain. Pertinent PMH includes cocaine/alcohol use and stroke. Patient not on any PTA anticoagulation, but is on DAPT outpatient. Hgb is stable  Heparin Course 11/22 initiation 4000unit bolus, then 1000 units/hr 11/22  2324 HL 0.24 bolus 1200 units, increase to 1150 units/hr 11/23 0633 HL 0.25 11/23 0428 HL 0.31  Inc to 1350 units/hr  Goal of Therapy:  Heparin level 0.3-0.7 units/ml Monitor platelets by anticoagulation protocol: Yes   Plan:  11/25 @ 0500 HL 0.51 therapeutic. Will continue current rate and will recheck w/ am labs. CBC stable, will continue to monitor.  Thomasene Ripple, PharmD, BCPS Clinical Pharmacist 05/06/2018

## 2018-05-06 NOTE — Progress Notes (Signed)
Patient ID: Clinton Knight, male   DOB: 05/20/1961, 57 y.o.   MRN: [id number]   [employer] PROGRESS NOTE  Clinton Knight HYQ:[id number] DOB: 11/02/1960 DOA: 05/03/2018 PCP: Patient, No Pcp Per  HPI/Subjective: Denies any chest pain, shortness of breath, nausea, or diaphoresis. States he is ready to go home.  Objective: Vitals:   05/06/18 1545 05/06/18 1600  BP:  119/68  Pulse: (!) 58 (!) 57  Resp: (!) 21 (!) 22  Temp:    SpO2: 96% 100%    Filed Weights   05/03/18 1319 05/03/18 1754 05/06/18 0437  Weight: 88 kg 87.4 kg 86.6 kg    ROS: Review of Systems  Constitutional: Negative for chills and fever.  Eyes: Negative for blurred vision.  Respiratory: Negative for cough and shortness of breath.   Cardiovascular: Negative for chest pain.  Gastrointestinal: Negative for abdominal pain, constipation, diarrhea, nausea and vomiting.  Genitourinary: Negative for dysuria.  Musculoskeletal: Negative for joint pain.  Neurological: Negative for dizziness and headaches.   Exam: Physical Exam  HENT:  Nose: No mucosal edema.  Mouth/Throat: No oropharyngeal exudate or posterior oropharyngeal edema.  Eyes: Pupils are equal, round, and reactive to light. Conjunctivae, EOM and lids are normal.  Neck: No JVD present. Carotid bruit is not present. No edema present. No thyroid mass and no thyromegaly present.  Cardiovascular: Normal rate, regular rhythm, S1 normal and S2 normal. Exam reveals no gallop.  No murmur heard. Pulses:      Dorsalis pedis pulses are 2+ on the right side, and 2+ on the left side.  Respiratory: No respiratory distress. He has no wheezes. He has no rhonchi. He has no rales.  GI: Soft. Bowel sounds are normal. There is no tenderness.  Musculoskeletal:       Right ankle: He exhibits no swelling.       Left ankle: He exhibits no swelling.  Lymphadenopathy:    He has no cervical adenopathy.  Neurological: He is alert. No cranial nerve deficit.  Skin: Skin is  warm. No rash noted. Nails show no clubbing.  Psychiatric: He has a normal mood and affect.      Data Reviewed: Basic Metabolic Panel: Recent Labs  Lab 05/03/18 1337 05/04/18 0633  NA 141 139  K 3.8 4.2  CL 106 108  CO2 27 25  GLUCOSE 140* 129*  BUN 12 13  CREATININE 1.04 1.03  CALCIUM 9.0 8.6*   CBC: Recent Labs  Lab 05/03/18 1337 05/04/18 0633 05/05/18 0428 05/06/18 0430  WBC 7.8 9.1 9.1 9.1  HGB 13.3 13.0 12.8* 13.7  HCT 43.3 41.8 40.9 44.9  MCV 89.8 89.1 89.1 90.2  PLT 220 233 237 252   Cardiac Enzymes: Recent Labs  Lab 05/03/18 1337 05/03/18 1604 05/03/18 1829 05/03/18 2324 05/04/18 0633  TROPONINI 0.06* 0.27* 0.30* 0.37* 0.23*   CBG: Recent Labs  Lab 05/04/18 1624 05/04/18 2117 05/05/18 0830 05/05/18 1134 05/05/18 1718  GLUCAP 98 137* 131* 121* 112*     Scheduled Meds: . [MAR Hold] aspirin EC  81 mg Oral Daily  . [MAR Hold] atorvastatin  40 mg Oral q1800  . [MAR Hold] clopidogrel  75 mg Oral Daily  . [MAR Hold] fenofibrate  160 mg Oral Daily  . [MAR Hold] Melatonin  5 mg Oral QHS  . [MAR Hold] nicotine  21 mg Transdermal Daily  . [MAR Hold] sodium chloride flush  3 mL Intravenous Q12H   Continuous Infusions: . [MAR Hold] sodium chloride  Assessment/Plan:  1. NSTEMI- Continue heparin drip, aspirin, Plavix, Lipitor.  No beta blocker due to bradycardia.  Plan for cardiac cath today. 2. Hyperlipidemia and hypertriglyceridemia- continue atorvastatin and fenofibrate 3. Hypertension- continue lisinopril. 4. Recent stroke- continue aspirin and Plavix. 5. Tobacco abuse- continue nicotine patch 6. Prediabetes- last hemoglobin A1c 6.4.   Code Status:     Code Status Orders  (From admission, onward)         Start     Ordered   05/03/18 1755  Full code  Continuous     05/03/18 1754        Code Status History    Date Active Date Inactive Code Status Order ID Comments User Context   03/22/2018 1446 03/29/2018 1454 Full Code  [id number]  Lynnae Prudengiulli, Daniel J, PA-C Inpatient   03/22/2018 1446 03/22/2018 1446 Full Code [id number]  Charlton Amorngiulli, Daniel J, PA-C Inpatient   03/20/2018 0004 03/22/2018 1410 Full Code [id number]  Oralia ManisWillis, David, MD ED      Disposition Plan: Likely discharge tomorrow after cath  Cons[facility]  [facility]  Time spent: 26 minutes  Hilton SinclairKaty D Scout Gumbs  [employer]

## 2018-05-06 NOTE — Progress Notes (Signed)
Progress Note  Patient Name: Clinton Knight Date of Encounter: 05/06/2018  Primary Cardiologist: Julien Nordmannimothy Gollan, MD   Subjective   No chest pain this morning.  Left heart catheterization is scheduled in the afternoon.  Inpatient Medications    Scheduled Meds: . aspirin EC  81 mg Oral Daily  . atorvastatin  40 mg Oral q1800  . clopidogrel  75 mg Oral Daily  . fenofibrate  160 mg Oral Daily  . lisinopril  5 mg Oral Daily  . Melatonin  5 mg Oral QHS  . nicotine  21 mg Transdermal Daily  . sodium chloride flush  3 mL Intravenous Q12H  . sodium chloride flush  3 mL Intravenous Q12H   Continuous Infusions: . sodium chloride    . sodium chloride    . sodium chloride 1 mL/kg/hr (05/06/18 0540)  . heparin 1,350 Units/hr (05/06/18 0305)   PRN Meds: sodium chloride, sodium chloride, acetaminophen, chlorpheniramine-HYDROcodone, nitroGLYCERIN, ondansetron (ZOFRAN) IV, sodium chloride flush, sodium chloride flush   Vital Signs    Vitals:   05/05/18 0829 05/05/18 1625 05/05/18 1946 05/06/18 0437  BP: 121/71 111/66 129/76 102/68  Pulse: (!) 52 61 66 (!) 52  Resp: 20 14 18 20   Temp: 97.9 F (36.6 C)  98.3 F (36.8 C) 98.3 F (36.8 C)  TempSrc: Oral  Oral   SpO2: 99% 100% 100% 100%  Weight:    86.6 kg  Height:        Intake/Output Summary (Last 24 hours) at 05/06/2018 0914 Last data filed at 05/06/2018 [id number] Gross per 24 hour  Intake 600 ml  Output 2575 ml  Net -1975 ml   Filed Weights   05/03/18 1319 05/03/18 1754 05/06/18 0437  Weight: 88 kg 87.4 kg 86.6 kg    Telemetry    Sinus bradycardia - Personally Reviewed  ECG    None since 11/22 - Personally Reviewed  Physical Exam   GEN: No acute distress.   Neck: No JVD Cardiac: RRR, no murmurs, rubs, or gallops.  Respiratory: Clear to auscultation bilaterally. GI: Soft, nontender, non-distended  MS: No edema; No deformity. Neuro:  Nonfocal  Psych: Normal affect  Right radial pulses normal.  Labs      Chemistry Recent Labs  Lab 05/03/18 1337 05/04/18 0633  NA 141 139  K 3.8 4.2  CL 106 108  CO2 27 25  GLUCOSE 140* 129*  BUN 12 13  CREATININE 1.04 1.03  CALCIUM 9.0 8.6*  GFRNONAA >60 >60  GFRAA >60 >60  ANIONGAP 8 6     Hematology Recent Labs  Lab 05/04/18 0633 05/05/18 0428 05/06/18 0430  WBC 9.1 9.1 9.1  RBC 4.69 4.59 4.98  HGB 13.0 12.8* 13.7  HCT 41.8 40.9 44.9  MCV 89.1 89.1 90.2  MCH 27.7 27.9 27.5  MCHC 31.1 31.3 30.5  RDW 13.8 13.5 13.8  PLT 233 237 252    Cardiac Enzymes Recent Labs  Lab 05/03/18 1604 05/03/18 1829 05/03/18 2324 05/04/18 0633  TROPONINI 0.27* 0.30* 0.37* 0.23*   No results for input(s): TROPIPOC in the last 168 hours.   BNPNo results for input(s): BNP, PROBNP in the last 168 hours.   DDimer No results for input(s): DDIMER in the last 168 hours.   Radiology    No results found.  Cardiac Studies   Echo was ordered this hospitalization, ?canceled vs. Pending  Prior: 03/20/18 Study Conclusions  - Left ventricle: The cavity size was normal. There was moderate   concentric hypertrophy. Systolic  function was normal. The   estimated ejection fraction was in the range of 60% to 65%. Wall   motion was normal; there were no regional wall motion   abnormalities. Left ventricular diastolic function parameters   were normal.  Impressions:  - No cardiac source of emboli was indentified.  Patient Profile     57 y.o. male with a hx of CVA, prior substance abuse, hypertension, hyperlipidemia who is being followed for the evaluation of NSTEMI at the request of Dr. Tobi Bastos.  Assessment & Plan    1. NSTEMI -already on aspirin and clopidogrel given recent CVA, continue -on heparin drip -Continue atorvastatin. The patient is scheduled for left heart catheterization this afternoon and he can have clear liquid diet this morning followed by n.p.o. -on lisinopril 5 mg.  -No beta-blockers due to bradycardia with heart rate in the  50s. -tobacco cessation -denies diabetes history to me, last A1c 6.4  Further recommendations to follow after cardiac catheterization.  2.  History of drug use including cocaine: He denies recent use and his urine drug screen was negative.  For questions or updates, please contact [facility] Please consult [email or web address] for contact info under     Signed, Lorine Bears, MD  05/06/2018, 9:14 AM

## 2018-05-07 ENCOUNTER — Encounter: Payer: Self-pay | Admitting: Cardiovascular Disease

## 2018-05-07 DIAGNOSIS — Z720 Tobacco use: Secondary | ICD-10-CM

## 2018-05-07 DIAGNOSIS — E785 Hyperlipidemia, unspecified: Secondary | ICD-10-CM

## 2018-05-07 LAB — BASIC METABOLIC PANEL
ANION GAP: 5 (ref 5–15)
BUN: 16 mg/dL (ref 6–20)
CO2: 25 mmol/L (ref 22–32)
CREATININE: 1.09 mg/dL (ref 0.61–1.24)
Calcium: 8.8 mg/dL — ABNORMAL LOW (ref 8.9–10.3)
Chloride: 108 mmol/L (ref 98–111)
GFR calc Af Amer: >60 mL/min (ref >60)
GFR calc non Af Amer: >60 mL/min (ref >60)
GLUCOSE: 112 mg/dL — AB (ref 70–99)
POTASSIUM: 4.7 mmol/L (ref 3.5–5.1)
SODIUM: 138 mmol/L (ref 135–145)

## 2018-05-07 LAB — CBC
HCT: 41.3 % (ref 39.0–52.0)
Hemoglobin: 12.9 g/dL — ABNORMAL LOW (ref 13.0–17.0)
MCH: 28 pg (ref 26.0–34.0)
MCHC: 31.2 g/dL (ref 30.0–36.0)
MCV: 89.6 fL (ref 80.0–100.0)
NRBC: 0 % (ref 0.0–0.2)
PLATELETS: 235 10*3/uL (ref 150–400)
RBC: 4.61 MIL/uL (ref 4.22–5.81)
RDW: 13.7 % (ref 11.5–15.5)
WBC: 9.1 10*3/uL (ref 4.0–10.5)

## 2018-05-07 MED ORDER — AMLODIPINE BESYLATE 5 MG PO TABS
2.5000 mg | ORAL_TABLET | Freq: Every day | ORAL | Status: DC
  Administered 2018-05-07: 2.5 mg via ORAL
  Filled 2018-05-07: qty 1

## 2018-05-07 MED ORDER — FENOFIBRATE 160 MG PO TABS: 160.0000 mg | ORAL_TABLET | Freq: Every day | ORAL | 0 refills | Status: DC

## 2018-05-07 MED ORDER — ASPIRIN 81 MG PO CHEW: 81.0000 mg | CHEWABLE_TABLET | Freq: Every day | ORAL | 0 refills | Status: DC

## 2018-05-07 MED ORDER — ATORVASTATIN CALCIUM 40 MG PO TABS: 40.0000 mg | ORAL_TABLET | Freq: Every day | ORAL | 0 refills | Status: DC

## 2018-05-07 MED ORDER — CLOPIDOGREL BISULFATE 75 MG PO TABS: 75.0000 mg | ORAL_TABLET | Freq: Every day | ORAL | 0 refills | Status: DC

## 2018-05-07 MED ORDER — LISINOPRIL 5 MG PO TABS: 5.0000 mg | ORAL_TABLET | Freq: Every day | ORAL | 0 refills | Status: DC

## 2018-05-07 MED ORDER — AMLODIPINE BESYLATE 2.5 MG PO TABS: 2.5000 mg | ORAL_TABLET | Freq: Every day | ORAL | 0 refills | Status: DC

## 2018-05-07 NOTE — Discharge Summary (Signed)
[facility] [place] [facility]   PATIENT NAME: Clinton Knight    MR#:  [id number]  DATE OF BIRTH:  07/16/1960  DATE OF ADMISSION:  05/03/2018   ADMITTING PHYSICIAN: Ihor AustinPavan Pyreddy, MD  DATE OF DISCHARGE: 05/07/18  PRIMARY CARE PHYSICIAN: Patient, No Pcp Per   ADMISSION DIAGNOSIS:  NSTEMI (non-ST elevated myocardial infarction) (HCC) [I21.4] DISCHARGE DIAGNOSIS:  Active Problems:   NSTEMI (non-ST elevated myocardial infarction) (HCC)  SECONDARY DIAGNOSIS:   Past Medical History:  Diagnosis Date  . Acute kidney insufficiency    03/2018: Cr 1.1 in setting of stroke (baseline ~0.9)  . Alcohol abuse   . Cocaine abuse (HCC)    03/2018: Postitive cocaine UA tox screen  . Diabetes (HCC)    03/20/18: A1C 6.4  . Hypertension   . Hypertriglyceridemia    03/20/18 TG 511  . Nicotine abuse   . Stroke [facility](HCC)    HOSPITAL COURSE:   Clinton Knight is a 57 year old male who presented to the ED with left-sided chest pain.  Troponin was elevated to 0.06 and then 0.27.  He was found to have an NSTEMI.  He was started on heparin drip.  He underwent cardiac cath on 11/25, which showed significant two-vessel coronary artery disease and coronary spasm.  Cardiology recommended dual antiplatelet therapy for 1 year.  Cardiology also added Norvasc for coronary spasm.  He was continued on Lipitor and fenofibrate for hyperlipidemia.  He was not started on a beta-blocker due to bradycardia.  He was discharged home with cardiology follow-up.  DISCHARGE CONDITIONS:  Coronary artery disease Hyperlipidemia Hypertriglyceridemia Hypertension Recent stroke Tobacco use Prediabetes CONSULTS OBTAINED:  Treatment Team:  Jodelle Redhristopher, Bridgette, MD DRUG ALLERGIES:  No Known Allergies DISCHARGE MEDICATIONS:   Allergies as of 05/07/2018   No Known Allergies     Medication List    TAKE these medications   acetaminophen 325 MG tablet Commonly known as:  TYLENOL Take 2 tablets (650 mg total)  by mouth every 4 (four) hours as needed for mild pain (or temp > 37.5 C (99.5 F)).   amLODipine 2.5 MG tablet Commonly known as:  NORVASC Take 1 tablet (2.5 mg total) by mouth daily. Start taking on:  05/08/2018   aspirin 81 MG chewable tablet Chew 1 tablet (81 mg total) by mouth daily.   atorvastatin 40 MG tablet Commonly known as:  LIPITOR Take 1 tablet (40 mg total) by mouth daily at 6 PM.   clopidogrel 75 MG tablet Commonly known as:  PLAVIX Take 1 tablet (75 mg total) by mouth daily.   fenofibrate 160 MG tablet Take 1 tablet (160 mg total) by mouth daily.   lisinopril 5 MG tablet Commonly known as:  PRINIVIL,ZESTRIL Take 1 tablet (5 mg total) by mouth daily.   Melatonin 3 MG Tabs Take 1 tablet (3 mg total) by mouth at bedtime.   nicotine 14 mg/24hr patch Commonly known as:  NICODERM CQ - dosed in mg/24 hours 14 mg patch daily x3 weeks then 7 mg patch daily x3 weeks and stop        DISCHARGE INSTRUCTIONS:  1.  Follow-up with PCP in 1 to 2 weeks 2.  Follow-up with cardiology in 2 weeks 3.  Cardiology recommended dual antiplatelet therapy for at least one year 4.  Patient started on Norvasc for coronary spasm 5.  Continue tobacco cessation counseling as an outpatient 6.  A1c was 6.4%.  Follow-up as an outpatient. DIET:  Cardiac diet DISCHARGE CONDITION:  Stable ACTIVITY:  Activity as tolerated OXYGEN:  Home Oxygen: No.  Oxygen Delivery: room air DISCHARGE LOCATION:  home   If you experience worsening of your admission symptoms, develop shortness of breath, life threatening emergency, suicidal or homicidal thoughts you must seek medical attention immediately by calling 911 or calling your MD immediately  if symptoms less severe.  You Must read complete instructions/literature along with all the possible adverse reactions/side effects for all the Medicines you take and that have been prescribed to you. Take any new Medicines after you have completely  understood and accpet all the possible adverse reactions/side effects.   Please note  You were cared for by a hospitalist during your hospital stay. If you have any questions about your discharge medications or the care you received while you were in the hospital after you are discharged, you can call the unit and asked to speak with the hospitalist on call if the hospitalist that took care of you is not available. Once you are discharged, your primary care physician will handle any further medical issues. Please note that NO REFILLS for any discharge medications will be authorized once you are discharged, as it is imperative that you return to your primary care physician (or establish a relationship with a primary care physician if you do not have one) for your aftercare needs so that they can reassess your need for medications and monitor your lab values.    On the day of Discharge:  VITAL SIGNS:  Blood pressure 118/66, pulse (!) 55, temperature 98.2 F (36.8 C), temperature source Oral, resp. rate 16, height 5\' 6"  (1.676 m), weight 86.6 kg, SpO2 99 %. PHYSICAL EXAMINATION:  GENERAL:  57 y.o.-year-old patient lying in the bed with no acute distress.  EYES: Pupils equal, round, reactive to light and accommodation. No scleral icterus. Extraocular muscles intact.  HEENT: Head atraumatic, normocephalic. Oropharynx and nasopharynx clear.  NECK:  Supple, no jugular venous distention. No thyroid enlargement, no tenderness.  LUNGS: Normal breath sounds bilaterally, no wheezing, rales,rhonchi or crepitation. No use of accessory muscles of respiration.  CARDIOVASCULAR: S1, S2 normal. No murmurs, rubs, or gallops.  ABDOMEN: Soft, non-tender, non-distended. Bowel sounds present. No organomegaly or mass.  EXTREMITIES: No pedal edema, cyanosis, or clubbing.  NEUROLOGIC: Cranial nerves II through XII are intact. Muscle strength 5/5 in all extremities. Sensation intact. Gait not checked.  PSYCHIATRIC: The  patient is alert and oriented x 3.  SKIN: No obvious rash, lesion, or ulcer.  DATA REVIEW:   CBC Recent Labs  Lab 05/07/18 0406  WBC 9.1  HGB 12.9*  HCT 41.3  PLT 235    Chemistries  Recent Labs  Lab 05/07/18 0406  NA 138  K 4.7  CL 108  CO2 25  GLUCOSE 112*  BUN 16  CREATININE 1.09  CALCIUM 8.8*     Microbiology Results  No results found for this or any previous visit.  RADIOLOGY:  No results found.   Management plans discussed with the patient, family and they are in agreement.  CODE STATUS: Full Code   TOTAL TIME TAKING CARE OF THIS PATIENT: 35 minutes.    Clinton Knight M.D on 05/07/2018 at 10:16 AM  Between 7am to 6pm - Pager - [phone]  After 6pm go to [email or web address] - Social research officer, government  [facility]  Office  [phone]  CC: Primary care physician; Patient, No Pcp Per   Note: This dictation was prepared with Dragon dictation along with smaller phrase technology. Any transcriptional  errors that result from this process are unintentional.

## 2018-05-07 NOTE — Progress Notes (Signed)
Pt discharged to home via wc.  Instructions and rx given to pt.  Questions answered.  No distress.  

## 2018-05-07 NOTE — Discharge Instructions (Signed)
It was so nice to meet you during this hospitalization! You came into the hospital with chest pain. You had a cardiac catheterization that showed some narrowing of 2 arteries. You did not need a stent.   Please make sure you take aspirin, plavix, and lipitor every single day.  The cardiologist has also added a medication called norvasc to help prevent spasm of the arteries of the heart.  Please make sure you follow-up with cardiology in the next 1-2 weeks.  -Dr. Nancy MarusMayo

## 2018-05-07 NOTE — Progress Notes (Addendum)
Cardiovascular and Pulmonary Nurse Navigator Note:    57 year old male patient well-built history of diabetes mellitus type 2, substance abuse, hypertension, hyperlipidemia presented to the emergency room for chest pain  Past Medical History:   . Acute kidney insufficiency    03/2018: Cr 1.1 in setting of stroke (baseline ~0.9)  . Alcohol abuse   . Cocaine abuse (HCC)    03/2018: Postitive cocaine UA tox screen  . Diabetes (HCC)    03/20/18: A1C 6.4  . Hypertension   . Hypertriglyceridemia    03/20/18 TG 511  . Nicotine abuse   . Stroke [facility](HCC)     [place] ArtistVince E Izzo  CARDIAC CATHETERIZATION  Order# [id number]  Reading physician: Iran OuchArida, Muhammad A, MD Ordering physician: Jodelle Redhristopher, Bridgette, MD Study date: 05/06/18  Physicians   Panel Physicians Referring Physician Case Authorizing Physician  Iran OuchArida, Muhammad A, MD (Primary)  Jodelle Redhristopher, Bridgette, MD  Procedures   LEFT HEART CATH AND CORONARY ANGIOGRAPHY  Conclusion     The left ventricular systolic function is normal.  LV end diastolic pressure is normal.  The left ventricular ejection fraction is 55-65% by visual estimate.  Dist Cx lesion is 85% stenosed.   1.  Significant two-vessel coronary artery disease involving distal left circumflex supplying a small territory as well as diffuse disease and diagonal branches.  There was significant spasm noted in the right coronary artery that improved with nitroglycerin. 2.  Normal LV systolic function and left ventricular end-diastolic pressure.  Recommendations: The distal left circumflex is relatively small in diameter and supplies a relatively small area.  No PCI is advised.  There is also diffuse diagonal disease.  In addition, it appears that the patient has coronary spasm. Recommend medical therapy with dual antiplatelet therapy for 1 year. Aggressive treatment of risk factors. I added amlodipine for spasm. Likely discharge home tomorrow.    _______________________________________________________________________________________________  Heart Attack Bouncing Back" booklet given and reviewed with patient. Discussed the definition of CAD. Reviewed the location of CAD.  ? Discussed modifiable risk factors including controlling blood pressure, cholesterol, and blood sugar; following heart healthy diet; maintaining healthy weight; exercise; and smoking cessation.  ? Discussed cardiac medications including rationale for taking, mechanisms of action, and side effects. Stressed the importance of taking medications as prescribed. Patient being discharged home on the following cardiac medications:  ASA, Plavix, Lisinopril, Amlodipine,  Atorvastatin, Fenofibrate.  Contacted RNCM to assist patient with obtaining medications.  Patient does not currently have health insurance, as patient has just started a new job.  He hopes to have health insurance in 90 days. ? Discussed emergency plan for heart attack symptoms. Patient verbalized understanding of need to call 911 and not to drive himself to ER if having cardiac symptoms / chest pain.  ? Heart healthy diet of low sodium, low fat, low cholesterol heart healthy diet discussed. Information on diet provided.  ? Smoking Cessation - Patient is a current every day smoker. Discussed the effects of smoking on the body.  Patient while hospitalized was using a Nicotine patch.  Information on smoking cessation provided to patient.  Patient being discharged on Nicotine patches as well.   ? Exercise - Benefits of exercised discussed.  Informed patient that his cardiologist has referred him to outpatient Cardiac Rehab. An overview of the program was provided. Patient currently does not have a payor source.  Patient declined participation in Cardiac Rehab program.  Patient informed this RN that he walks a lot and would like to continue walking on his  own.  Reviewed the American Heart Association guidelines for  exercise with patient.    Follow-up:  Patient has an appointment to follow-up with Eula Listen, PA-C on 05/22/2018.   ? Patient appreciative of the information.  ? Army Melia, RN, BSN, [facility]  [place]  [facility] Cardiac & Pulmonary Rehab  Cardiovascular & Pulmonary Nurse Navigator  Direct Line: [phone]  Department Phone #: [phone] Fax: [phone]  Email Address: [email or web address]

## 2018-05-07 NOTE — Care Management Note (Signed)
Case Management Note  Patient Details  Name: Clinton Knight MRN: [id number] Date of Birth: Sep 21, 1960  Subjective/Objective:     Patient admitted to the hospital for NSTEMI.  Patient is being discharged home today.  RNCM consult for medication assistance.  Patient lives with his sister in [place].  He reports that he is working at [employer], but he has only been there for about a month so he does not have insurance yet.  Patient is being discharged with prescriptions for Plavix 75 mg, asa 81mg , lipitor 40 mg , fenofibrate 160 mg, amlodipine 2.5mg , and lisinopril 5 mg.  Prescriptions faxed to [facility] [phone].  All prescriptions are on the [facility] Preferred Medication list for commonly seen disease states.  [facility] for [facility] and [facility] given to patient to complete.  Patient is aware that he can pick up his prescriptions at [facility]- they are closed for lunch from 1230-130 daily.  Referral made to [facility] and other local indigent care clinics list given to patient.   $4 [facility] also given to patient.  Patient does not drive but he has a friend that is coming to pick him up and he gets rides to work.   Robbie LisJeanna Alicha Raspberry RN BSN [phone]                    Action/Plan: Patient discharged home- [facility].   Expected Discharge Date:  05/07/18               Expected Discharge Plan:  Home/Self Care  In-House Referral:     Discharge planning Services  CM Consult  Post Acute Care Choice:    Choice offered to:     DME Arranged:    DME Agency:     HH Arranged:    HH Agency:     Status of Service:  Completed, signed off  If discussed at [employer] Length of Stay Meetings, dates discussed:    Additional Comments:  Clinton ButcherJeanna M Zayley Arras, RN 05/07/2018, 11:16 AM

## 2018-05-07 NOTE — Progress Notes (Signed)
Progress Note  Patient Name: Clinton Knight Date of Encounter: 05/07/2018  Primary Cardiologist: Julien Nordmann, MD  Subjective   Patient feels well.  No chest pain or shortness of breath.  He was able to ambulate without difficulty this morning.  Inpatient Medications    Scheduled Meds: . amLODipine  2.5 mg Oral Daily  . aspirin EC  81 mg Oral Daily  . atorvastatin  40 mg Oral q1800  . clopidogrel  75 mg Oral Daily  . fenofibrate  160 mg Oral Daily  . Melatonin  5 mg Oral QHS  . nicotine  21 mg Transdermal Daily  . sodium chloride flush  3 mL Intravenous Q12H   Continuous Infusions: . sodium chloride     PRN Meds: sodium chloride, acetaminophen, chlorpheniramine-HYDROcodone, nitroGLYCERIN, ondansetron (ZOFRAN) IV, sodium chloride flush   Vital Signs    Vitals:   05/06/18 1629 05/06/18 2000 05/07/18 0530 05/07/18 0847  BP: 125/71 117/86 113/74 118/66  Pulse: (!) 55 60 (!) 52 (!) 55  Resp: 18 16 16    Temp: 98 F (36.7 C) 97.9 F (36.6 C) 97.9 F (36.6 C) 98.2 F (36.8 C)  TempSrc:  Oral Oral Oral  SpO2: 100% 100% 100% 99%  Weight:      Height:        Intake/Output Summary (Last 24 hours) at 05/07/2018 0910 Last data filed at 05/07/2018 0800 Gross per 24 hour  Intake 3 ml  Output 2350 ml  Net -2347 ml   Filed Weights   05/03/18 1319 05/03/18 1754 05/06/18 0437  Weight: 88 kg 87.4 kg 86.6 kg    Telemetry    Normal sinus rhythm and sinus bradycardia - Personally Reviewed  ECG    No new tracing - Personally Reviewed  Physical Exam   GEN: No acute distress.   Neck: No JVD Cardiac: RRR, no murmurs, rubs, or gallops.  Right radial arteriotomy site covered with clean dressing.  No hematoma.  2+ right radial pulse. Respiratory: Clear to auscultation bilaterally. GI: Soft, nontender, non-distended  MS: No edema; No deformity. Neuro:  Nonfocal  Psych: Normal affect   Labs    Chemistry Recent Labs  Lab 05/03/18 1337 05/04/18 0633  05/07/18 0406  NA 141 139 138  K 3.8 4.2 4.7  CL 106 108 108  CO2 27 25 25   GLUCOSE 140* 129* 112*  BUN 12 13 16   CREATININE 1.04 1.03 1.09  CALCIUM 9.0 8.6* 8.8*  GFRNONAA >60 >60 >60  GFRAA >60 >60 >60  ANIONGAP 8 6 5      Hematology Recent Labs  Lab 05/05/18 0428 05/06/18 0430 05/07/18 0406  WBC 9.1 9.1 9.1  RBC 4.59 4.98 4.61  HGB 12.8* 13.7 12.9*  HCT 40.9 44.9 41.3  MCV 89.1 90.2 89.6  MCH 27.9 27.5 28.0  MCHC 31.3 30.5 31.2  RDW 13.5 13.8 13.7  PLT 237 252 235    Cardiac Enzymes Recent Labs  Lab 05/03/18 1604 05/03/18 1829 05/03/18 2324 05/04/18 0633  TROPONINI 0.27* 0.30* 0.37* 0.23*   No results for input(s): TROPIPOC in the last 168 hours.   BNPNo results for input(s): BNP, PROBNP in the last 168 hours.   DDimer No results for input(s): DDIMER in the last 168 hours.   Radiology    No results found.  Cardiac Studies   LHC (05/06/2018): 1.  Significant two-vessel coronary artery disease involving distal left circumflex supplying a small territory as well as diffuse disease and diagonal branches.  There was significant  spasm noted in the right coronary artery that improved with nitroglycerin. 2.  Normal LV systolic function and left ventricular Danish Ruffins-diastolic pressure.  Diagnostic Diagram        Patient Profile     57 y.o. male CVA, prior substance abuse, hypertension, and hyperlipidemia, admitted with NSTEMI.  Assessment & Plan    NSTEMI Patient without further symptoms.  Catheterization yesterday, which I have personally reviewed, shows significant disease involving diagonals and distal LCx.  Vessels are not amenable to PCI.  Vasospasm also noted in the RCA.  Medical therapy was advised.  Complete at least 12 months of dual antiplatelet therapy with aspirin and clopidogrel.  Discontinue lisinopril and initiate amlodipine 2.5 mg daily for treatment of vasospasm.  Aggressive secondary prevention, including lipid management and tobacco  cessation.  Hyperlipidemia LDL well controlled at 56 on admission.  Continue atorvastatin 40 mg daily and fenofibrate.  Tobacco abuse  Smoking cessation encouraged.  [facility] will sign off.  Patient is suitable for discharge home today. Medication Recommendations: Aspirin and clopidogrel for 12 months, amlodipine 2.5 mg daily, and atorvastatin 40 mg daily.  Lisinopril to be discontinued. Other recommendations (labs, testing, etc): None. Follow up as an outpatient: Return to see Dr. Mariah MillingGollan or APP in approximately 2 weeks.  For questions or updates, please contact [facility] Please consult [email or web address] for contact info under [facility].     Signed, Yvonne Kendallhristopher Kyana Aicher, MD  05/07/2018, 9:10 AM

## 2018-05-13 ENCOUNTER — Telehealth: Payer: Self-pay

## 2018-05-13 NOTE — Telephone Encounter (Signed)
EMMI Follow-up: Received a voice message from Clinton Knight on Sunday, 05/12/18.  He had received an automated call and checking to see why we had called as he was at work and couldn't take the call when it came in.  I explained our process of two automated calls post discharge and asked if he had his Rx filled and he responded yes.  I asked if he had his after visit summary near by as it has some follow-up appointments listed.  He didn't have the paperwork with him so we reviewed them over the phone.  I let him know there would be another automated call and if he had any concerns at that time to let us know.

## 2018-05-14 ENCOUNTER — Telehealth: Payer: Self-pay | Admitting: Adult Health Nurse Practitioner

## 2018-05-14 NOTE — Telephone Encounter (Signed)
Called to determine eligibility. Patient states that he has no insurance and income under $1518/mth. Will bring in documents and person he stays with to fill out application.

## 2018-05-15 ENCOUNTER — Telehealth: Payer: Self-pay | Admitting: *Deleted

## 2018-05-15 NOTE — Telephone Encounter (Signed)
-----   Message from Sondra Bargesyan M Dunn, PA-C sent at 05/11/2018  8:56 AM EST ----- Monitor showed NSR with an average heart rate of 75 bpm.  Minimal heart rate was 29 bpm.  First degree AV block was noted Longest pause was 3.2 seconds at 11:36 PM. His bradycardic events seemed to correspond with sleep time.  Rare extra betas from the top and bottom portions of the heart.   Patient triggered events corresponded to sinus rhythm, sinus tachycardia, and artifact.   Recommend previously discussed sleep apnea screening.  Pending those results, we may need to have him see EP.   Follow up as planned.

## 2018-05-15 NOTE — Telephone Encounter (Signed)
Results called to pt. Pt verbalized understanding. He said he is willing to proceed with the sleep study though he declined earlier in November. He is aware of follow up appointment with us next week. Routing to Pulmonology for follow up on sleep study.

## 2018-05-16 NOTE — Telephone Encounter (Signed)
Sleep Med form completed and faxed to Sleep [facility], with note stating that pt is willing to schedule study at this time.  Please contact to arrange. Confirmation received via fax that Sleep Med has received order.  Please proceed with contacting patient to schedule ASAP. Nothing else needed at this time. [name]

## 2018-05-20 NOTE — Progress Notes (Signed)
Cardiology Office Note Date:  05/22/2018  Patient ID:  Clinton Knight, DOB [birth year], MRN [id number] PCP:  Patient, No Pcp Per  Cardiologist:  Dr. Mariah Milling, MD    Chief Complaint: Hospital follow up  History of Present Illness: Clinton Knight is a 57 y.o. male with history of CAD medically managed as below, multiple strokes, sinus pause while sleeping, DM2, HTN, HLD, and polysubstance abuse with ongoing alcohol, tobacco, and cocaine abuse who presents for hospital follow up after recent admission to [facility] from 11/22 to 11/26 for NSTEMI.   Patient was reportedly previously evaluated by an outside cardiologist for intermittent chest pain with workup being unrevealing per the patient with symptoms continuing for the past several years. He was admitted to the hospital from 10/8 for 10/11 right pontine stroke. On telemetry, overnight while sleeping he was noted to have sinus pauses during sleep of less than 4 seconds felt to be secondary to increased vagal tone. There is mention in notes of there being Mobitz type I with the inability to exclude Mobitz type II noted. No indication for PPM. There was no evidence of high grade AV block on baseline EKG. Recommendation was made to avoid medications that can cause bradycardia. Sleep apnea screening was advised. Echo 10/10 showed an EF of 60-65%, normal wall motion, LV diastolic function normal, no source of emboli. TSH normal, potassium 4.3, magnesium 2.1, urine drug screen positive for cocaine, troponin negative, TC 156, TG 511, direct LDL 113, A1c 6.4. Neurology recommended ASA 81 mg and Plavix 75 mg, and LDL < 70. He was discharged to inpatient rehab. In follow up on 10/31, he was doing reasonably well and continued to have some left-sided weakness that was improving. He denied using any further cocaine. He reported a long history of stable intermittent chest pain that went "back years" and lasted ~ 5 seconds before spontaneously resolving. No dizziness,  presyncope, or syncope. He wore an Zio monitor that showed NSR with an average heart rate of 75 bpm, with a minimum heart rate of 29 bpm. 1st degree AV block was noted. The longest pause of 3.2 seconds. The patient's bradycardic events seemed to be during sleep. There were rare PACs and PVCs.   He was admitted to [facility] on 11/22 with a NSTEMI. Troponin peaked at 0.37. Urine drug screen was negative. LDL was improved to 56. He underwent LHC on 05/06/2018 that showed significant 2-vessel CAD involving the distal LCx supplying a small territory as well as diffuse disease in the diagonal branches. There was significant spasm noted in the RCA that improved with nitroglycerin. He had normal LVSF and LVEDP. The vessels were not amenable to PCI and medical management was advised.   He comes in doing well from a cardiac perspective.  He is compliant with all medications including aspirin and Plavix.  He did have a brief episode of chest pain on 12/10 that lasted for 5 to 10 seconds and was similar to his prior episodes.  He still does not have insurance so requests that we defer his sleep study.  He is currently smoking 2 to 3 cigarettes/day and hopes to taper off completely.  He has not yet picked up his nicotine patches.  [facility] remaining paperwork to be established there.  He is getting his medications through medication management.  He denies any further alcohol or cocaine use.  He is trying to eat a healthier  diet.  Since he was last seen, he has not had any falls.  No BRBPR or melena.  No dizziness, presyncope, or syncope.  No lower extremity swelling, abdominal distention, PND, orthopnea, or early satiety.  He continues to feel fatigued following his recent admissions though notes his energy level is improving.  Past Medical History:  Diagnosis Date  . Acute kidney insufficiency    03/2018: Cr 1.1 in setting of stroke (baseline ~0.9)  . Alcohol  abuse   . Cocaine abuse (HCC)    03/2018: Postitive cocaine UA tox screen  . Diabetes (HCC)    03/20/18: A1C 6.4  . Hypertension   . Hypertriglyceridemia    03/20/18 TG 511  . Nicotine abuse   . Stroke [facility])     Past Surgical History:  Procedure Laterality Date  . LEFT HEART CATH AND CORONARY ANGIOGRAPHY N/A 05/06/2018   Procedure: LEFT HEART CATH AND CORONARY ANGIOGRAPHY;  Surgeon: Iran Ouch, MD;  Location: [facility] INVASIVE CV LAB;  Service: Cardiovascular;  Laterality: N/A;  . NO PAST SURGERIES      Current Meds  Medication Sig  . acetaminophen (TYLENOL) 325 MG tablet Take 2 tablets (650 mg total) by mouth every 4 (four) hours as needed for mild pain (or temp > 37.5 C (99.5 F)).  Marland Kitchen amLODipine (NORVASC) 2.5 MG tablet Take 1 tablet (2.5 mg total) by mouth daily.  Marland Kitchen aspirin 81 MG chewable tablet Chew 1 tablet (81 mg total) by mouth daily.  Marland Kitchen atorvastatin (LIPITOR) 40 MG tablet Take 1 tablet (40 mg total) by mouth daily at 6 PM.  . clopidogrel (PLAVIX) 75 MG tablet Take 1 tablet (75 mg total) by mouth daily.  . fenofibrate 160 MG tablet Take 1 tablet (160 mg total) by mouth daily.  Marland Kitchen lisinopril (PRINIVIL,ZESTRIL) 5 MG tablet Take 1 tablet (5 mg total) by mouth daily.    Allergies:   Patient has no known allergies.   Social History:  The patient  reports that he has been smoking cigarettes. He has been smoking about 0.25 packs per day. He has never used smokeless tobacco. He reports that he drinks alcohol. He reports that he has current or past drug history. Drug: Cocaine.   Family History:  The patient's family history includes Diabetes in his mother; Heart attack (age of onset: 65) in his brother; Hypertension in his mother; Stroke in his mother; Thyroid disease in his daughter.  ROS:   Review of Systems  Constitutional: Positive for malaise/fatigue. Negative for chills, diaphoresis, fever and weight loss.  HENT: Negative for congestion.   Eyes: Negative for discharge and  redness.  Respiratory: Negative for cough, hemoptysis, sputum production, shortness of breath and wheezing.   Cardiovascular: Positive for chest pain. Negative for palpitations, orthopnea, claudication, leg swelling and PND.  Gastrointestinal: Negative for abdominal pain, blood in stool, heartburn, melena, nausea and vomiting.  Genitourinary: Negative for hematuria.  Musculoskeletal: Negative for falls and myalgias.  Skin: Negative for rash.  Neurological: Negative for dizziness, tingling, tremors, sensory change, speech change, focal weakness, loss of consciousness and weakness.  Endo/Heme/Allergies: Does not bruise/bleed easily.  Psychiatric/Behavioral: Negative for substance abuse. The patient is not nervous/anxious.   All other systems reviewed and are negative.    PHYSICAL EXAM:  VS:  BP 120/62 (BP Location: Left Arm, Patient Position: Sitting, Cuff Size: Normal)   Pulse (!) 58   Ht 5\' 6"  (1.676 m)   Wt 195 lb 8 oz (88.7 kg)   BMI 31.55 kg/m  BMI: Body mass index is 31.55 kg/m.  Physical Exam  Constitutional: He is oriented to person, place, and time. He appears well-developed and well-nourished.  HENT:  Head: Normocephalic and atraumatic.  Eyes: Right eye exhibits no discharge. Left eye exhibits no discharge.  Neck: Normal range of motion. No JVD present.  Cardiovascular: Normal rate, regular rhythm, S1 normal, S2 normal and normal heart sounds. Exam reveals no distant heart sounds, no friction rub, no midsystolic click and no opening snap.  No murmur heard. Pulses:      Posterior tibial pulses are 2+ on the right side, and 2+ on the left side.  Pulmonary/Chest: Effort normal and breath sounds normal. No respiratory distress. He has no decreased breath sounds. He has no wheezes. He has no rales. He exhibits no tenderness.  Abdominal: Soft. He exhibits no distension. There is no tenderness.  Musculoskeletal: He exhibits no edema.  Neurological: He is alert and oriented to  person, place, and time.  Skin: Skin is warm and dry. No cyanosis. Nails show no clubbing.  Psychiatric: He has a normal mood and affect. His speech is normal and behavior is normal. Judgment and thought content normal.     EKG:  Was ordered and interpreted by me today. Shows sinus bradycardia, 58 bpm, no acute st/t changes   Recent Labs: 03/21/2018: Magnesium 2.1; TSH 4.131 03/25/2018: ALT 30 05/07/2018: BUN 16; Creatinine, Ser 1.09; Hemoglobin 12.9; Platelets 235; Potassium 4.7; Sodium 138  03/21/2018: Direct LDL 113 05/04/2018: Cholesterol 110; HDL 31; LDL Cholesterol 56; Total CHOL/HDL Ratio 3.5; Triglycerides 114; VLDL 23   Estimated Creatinine Clearance: 79 mL/min (by C-G formula based on SCr of 1.09 mg/dL).   Wt Readings from Last 3 Encounters:  05/22/18 195 lb 8 oz (88.7 kg)  05/21/18 196 lb 3.2 oz (89 kg)  05/06/18 190 lb 14.7 oz (86.6 kg)     Other studies reviewed: Additional studies/records reviewed today include: summarized above  ASSESSMENT AND PLAN:  1. CAD involving the native coronary arteries with stable angina and documented coronary artery vasospasm: Currently symptom-free.  Start Imdur 15 mg daily given his documented coronary artery vasospasm during cardiac catheterization that was responsive to nitroglycerin as well as his recurrent brief episode of chest pain on 12/10.  It is certainly possible that this was in the setting of vasospasm.  He remains on amlodipine 2.5 mg daily.  Continue dual antiplatelet therapy with aspirin and Plavix.  Not currently on beta-blocker given his underlying bradycardia and sinus pauses as outlined below.  Continue fenofibrate and Lipitor.  Aggressive risk factor modification including complete smoking cessation and secondary prevention.  No plans for further ischemic evaluation at this time.  2. Sinus pauses/sinus bradycardia: Noted while sleeping during admission in 03/2018 and again on outpatient cardiac monitoring. Patient was  referred to pulmonology for sleep study which was scheduled. Patient then refused sleep study due to lack of insurance. Recommend he obtain sleep study when we can. No indication for PPM in this setting at this time given the above circumstances of his pauses and likely untreated sleep apnea. Continue to avoid AV nodal blocking medications.   3. Multiple strokes: Has follow up with neurology later this month.  Remains on dual and a platelet therapy per neurology recommendations.  Optimal blood pressure and lipid control recommended.  4. HTN: Blood pressure is well controlled today.  Continue Norvasc 2.5 mg daily as well as lisinopril 5 mg daily.  Add Imdur 15 mg daily as above.  5.  HLD: LDL of 56 from 04/2018.  Continue Lipitor and fenofibrate.  6. Polysubstance abuse: He indicates he no longer drinks alcohol or uses cocaine.  He is smoking 2 to 3 cigarettes/day.  He has prescription for nicotine patches though has not yet filled these.  [facility].  Complete smoking cessation is advised.  Disposition: F/u with Dr. Mariah Milling at previously scheduled appointment in 06/2018.  Current medicines are reviewed at length with the patient today.  The patient did not have any concerns regarding medicines.  Signed, Eula Listen, PA-C 05/22/2018 9:17 AM     [facility] [address] [place], [place] [address] [phone]

## 2018-05-21 ENCOUNTER — Encounter: Payer: Self-pay | Admitting: Physical Medicine & Rehabilitation

## 2018-05-21 ENCOUNTER — Encounter: Payer: Self-pay | Attending: Registered Nurse

## 2018-05-21 ENCOUNTER — Other Ambulatory Visit: Payer: Self-pay

## 2018-05-21 ENCOUNTER — Ambulatory Visit (HOSPITAL_BASED_OUTPATIENT_CLINIC_OR_DEPARTMENT_OTHER): Payer: Self-pay | Admitting: Physical Medicine & Rehabilitation

## 2018-05-21 VITALS — BP 143/81 | HR 65 | Ht 66.0 in | Wt 196.2 lb

## 2018-05-21 DIAGNOSIS — E781 Pure hyperglyceridemia: Secondary | ICD-10-CM | POA: Insufficient documentation

## 2018-05-21 DIAGNOSIS — Z8249 Family history of ischemic heart disease and other diseases of the circulatory system: Secondary | ICD-10-CM | POA: Insufficient documentation

## 2018-05-21 DIAGNOSIS — E119 Type 2 diabetes mellitus without complications: Secondary | ICD-10-CM | POA: Insufficient documentation

## 2018-05-21 DIAGNOSIS — I6350 Cerebral infarction due to unspecified occlusion or stenosis of unspecified cerebral artery: Secondary | ICD-10-CM

## 2018-05-21 DIAGNOSIS — F1910 Other psychoactive substance abuse, uncomplicated: Secondary | ICD-10-CM | POA: Insufficient documentation

## 2018-05-21 DIAGNOSIS — F17210 Nicotine dependence, cigarettes, uncomplicated: Secondary | ICD-10-CM | POA: Insufficient documentation

## 2018-05-21 DIAGNOSIS — Z7982 Long term (current) use of aspirin: Secondary | ICD-10-CM | POA: Insufficient documentation

## 2018-05-21 DIAGNOSIS — I10 Essential (primary) hypertension: Secondary | ICD-10-CM | POA: Insufficient documentation

## 2018-05-21 DIAGNOSIS — Z79899 Other long term (current) drug therapy: Secondary | ICD-10-CM | POA: Insufficient documentation

## 2018-05-21 DIAGNOSIS — I455 Other specified heart block: Secondary | ICD-10-CM | POA: Insufficient documentation

## 2018-05-21 DIAGNOSIS — G479 Sleep disorder, unspecified: Secondary | ICD-10-CM | POA: Insufficient documentation

## 2018-05-21 NOTE — Progress Notes (Signed)
Subjective:    Patient ID: Clinton Knight, male    DOB: Jan 18, 1961, 57 y.o.   MRN: [id number] 57 year old right-handed male with history of chronic renal insufficiency.  Creatinine 1.1.  Alcohol, tobacco, cocaine use, diabetes mellitus and hypertension.  Independent prior to admission.  Plan was to discharge to home  with his sister.  Presented 03/19/2018 to [facility] with left-sided weakness, slurred speech.  Cranial CT scan negative for acute findings.  Noted remote bilateral basal ganglia and left cerebellar infarctions.  The patient did not  receive tPA.  Urine drug screen positive for cocaine.  Troponin negative.  MRI showed acute infarct in the right pons.  Angiogram of head and neck with no acute ischemia, no emergent large vessel occlusion.  Echocardiogram with ejection fraction of 65%,  no wall motion abnormalities.  Cardiology consulted for evaluation of sinus pauses noted on telemetry.  No evidence of high grade AV block noted advised to avoid any medications that can cause bradycardia.  No further workup indicated.  Maintained on  aspirin and Plavix for CVA prophylaxis.  HPI No longer using cane Sees cardiology in [place] Dressing and bathing Mod I No falls  Denies alcohol cocaine.  Still smokes ~2-3 cig per day Patient has been hospitalized for non-ST elevation MI.  Catheterization on 05/06/2018 demonstrated circumflex artery stenosis 85%.  There was spasm of the RCA responsive to nitroglycerin  Pain Inventory Average Pain 0 Pain Right Now 0 My pain is no pain  In the last 24 hours, has pain interfered with the following? General activity 0 Relation with others 0 Enjoyment of life 0 What TIME of day is your pain at its worst? no pain Sleep (in general) NA  Pain is worse with: no pain Pain improves with: no pain Relief from Meds: no pain  Mobility walk without assistance  Function disabled: date disabled  temp  Neuro/Psych depression  Prior Studies Any changes since last visit?  yes  MI  Physicians involved in your care Any changes since last visit?  no   Family History  Problem Relation Age of Onset  . Diabetes Mother   . Stroke Mother   . Hypertension Mother   . Heart attack Brother 47       Brother, MI, 52 yo  . Thyroid disease Daughter        Removed her entire thyroid   Social History   Socioeconomic History  . Marital status: Single    Spouse name: Not on file  . Number of children: Not on file  . Years of education: Not on file  . Highest education level: Not on file  Occupational History  . Not on file  Social Needs  . Financial resource strain: Not on file  . Food insecurity:    Worry: Not on file    Inability: Not on file  . Transportation needs:    Medical: Not on file    Non-medical: Not on file  Tobacco Use  . Smoking status: Current Every Day Smoker    Packs/day: 0.50    Types: Cigarettes  . Smokeless tobacco: Never Used  Substance and Sexual Activity  . Alcohol use: Yes  . Drug use: Yes    Types: Cocaine  . Sexual activity: Not on file  Lifestyle  . Physical activity:    Days per week: Not on file    Minutes per session: Not on file  . Stress: Not on file  Relationships  . Social  connections:    Talks on phone: Not on file    Gets together: Not on file    Attends religious service: Not on file    Active member of club or organization: Not on file    Attends meetings of clubs or organizations: Not on file    Relationship status: Not on file  Other Topics Concern  . Not on file  Social History Narrative  . Not on file   Past Surgical History:  Procedure Laterality Date  . LEFT HEART CATH AND CORONARY ANGIOGRAPHY N/A 05/06/2018   Procedure: LEFT HEART CATH AND CORONARY ANGIOGRAPHY;  Surgeon: Iran OuchArida, Muhammad A, MD;  Location: [facility] INVASIVE CV LAB;  Service: Cardiovascular;  Laterality: N/A;  . NO PAST SURGERIES     Past Medical  History:  Diagnosis Date  . Acute kidney insufficiency    03/2018: Cr 1.1 in setting of stroke (baseline ~0.9)  . Alcohol abuse   . Cocaine abuse (HCC)    03/2018: Postitive cocaine UA tox screen  . Diabetes (HCC)    03/20/18: A1C 6.4  . Hypertension   . Hypertriglyceridemia    03/20/18 TG 511  . Nicotine abuse   . Stroke (HCC)    BP (!) 143/81   Pulse 65   Ht 5\' 6"  (1.676 m)   Wt 196 lb 3.2 oz (89 kg)   SpO2 97%   BMI 31.67 kg/m   Opioid Risk Score:   Fall Risk Score:  `1  Depression screen PHQ 2/9  [facility] 05/21/2018 04/09/2018  Decreased Interest 1 3  Down, Depressed, Hopeless 1 1  PHQ - 2 Score 2 4  Altered sleeping - 3  Tired, decreased energy - 1  Change in appetite - 0  Feeling bad or failure about yourself  - 0  Trouble concentrating - 0  Moving slowly or fidgety/restless - 2  Suicidal thoughts - 0  PHQ-9 Score - 10  Difficult doing work/chores - Somewhat difficult    Review of Systems  Constitutional: Negative.   HENT: Negative.   Eyes: Negative.   Respiratory: Negative.   Cardiovascular: Negative.   Gastrointestinal: Negative.   Endocrine: Negative.   Genitourinary: Negative.   Musculoskeletal: Negative.   Skin: Negative.   Allergic/Immunologic: Negative.   Hematological: Negative.   Psychiatric/Behavioral: Negative.   All other systems reviewed and are negative.      Objective:   Physical Exam  Constitutional: He appears well-developed and well-nourished.  HENT:  Head: Normocephalic and atraumatic.  Nursing note and vitals reviewed. Motor strength is 5/5 bilateral deltoid, bicep, tricep, grip, hip flexor, knee extensor, ankle dorsiflexor Extremities without edema Gait without evidence of toe drag or knee stability Speech without evidence of dysarthria or aphasia Vision intact visual fields as well as extraocular muscles Heart regular rate and rhythm no rubs murmurs extra sounds Abdomen positive bowel sounds soft  nontender palpation Lungs clear to auscultation no wheezes      Assessment & Plan:  #1.  History of right pontine infarct without any significant residual deficits We discussed risk factor modification including smoking cessation.  He would like to get some patches I advised that he gets the 7 mcg patch given that he is only smoking 2 to 3 cigarettes/day at the current time. We also discussed alcohol cessation which he states he is already doing as well as avoiding cocaine which he states he is also doing. We discussed keeping up with his medications and we reviewed his current medications  the only new one was Norvasc. Also [facility] [place] area.  They have contacted him and he still needs to fill out some paperwork. In addition he needs to follow-up with cardiology nurse practitioner tomorrow and cardiologist in January.  We also discussed the importance of following up with neurology on December 17 with Dr. Daisy Blossom. Physical medicine rehab follow-up on as-needed basis

## 2018-05-21 NOTE — Patient Instructions (Addendum)
Nicoderm patch may get at drug store- get the lowest dose patch  Quit smoking  Stay off alcohol and other substances  Keep walking for exercise at least per day  [facility]ol[facility] with [facility] Follow up with cardiology and Neurology

## 2018-05-22 ENCOUNTER — Encounter: Payer: Self-pay | Admitting: Physician Assistant

## 2018-05-22 ENCOUNTER — Ambulatory Visit (INDEPENDENT_AMBULATORY_CARE_PROVIDER_SITE_OTHER): Payer: Self-pay | Admitting: Physician Assistant

## 2018-05-22 VITALS — BP 120/62 | HR 58 | Ht 66.0 in | Wt 195.5 lb

## 2018-05-22 DIAGNOSIS — I455 Other specified heart block: Secondary | ICD-10-CM

## 2018-05-22 DIAGNOSIS — F1910 Other psychoactive substance abuse, uncomplicated: Secondary | ICD-10-CM

## 2018-05-22 DIAGNOSIS — I10 Essential (primary) hypertension: Secondary | ICD-10-CM

## 2018-05-22 DIAGNOSIS — R001 Bradycardia, unspecified: Secondary | ICD-10-CM

## 2018-05-22 DIAGNOSIS — Z8673 Personal history of transient ischemic attack (TIA), and cerebral infarction without residual deficits: Secondary | ICD-10-CM

## 2018-05-22 DIAGNOSIS — I25111 Atherosclerotic heart disease of native coronary artery with angina pectoris with documented spasm: Secondary | ICD-10-CM

## 2018-05-22 DIAGNOSIS — E785 Hyperlipidemia, unspecified: Secondary | ICD-10-CM

## 2018-05-22 MED ORDER — ISOSORBIDE MONONITRATE ER 30 MG PO TB24: 15.0000 mg | ORAL_TABLET | Freq: Every day | ORAL | 3 refills | Status: DC

## 2018-05-22 NOTE — Patient Instructions (Signed)
Medication Instructions:  Your physician has recommended you make the following change in your medication:  1- Start Imdur 0.5 tablet (15 mg total) once daily by mouth  If you need a refill on your cardiac medications before your next appointment, please call your pharmacy.   Lab work: None ordered   If you have labs (blood work) drawn today and your tests are completely normal, you will receive your results only by: Marland Kitchen. MyChart Message (if you have MyChart) OR . A paper copy in the mail If you have any lab test that is abnormal or we need to change your treatment, we will call you to review the results.  Testing/Procedures: None ordered

## 2018-05-23 NOTE — Addendum Note (Signed)
Addended by: Festus AloeRESPO, SHARON G on: 05/23/2018 01:05 PM   Modules accepted: Orders

## 2018-05-30 ENCOUNTER — Encounter: Payer: Self-pay | Admitting: Neurology

## 2018-05-30 ENCOUNTER — Ambulatory Visit: Payer: Self-pay | Admitting: Neurology

## 2018-06-19 ENCOUNTER — Telehealth: Payer: Self-pay | Admitting: Pharmacy Technician

## 2018-06-19 NOTE — Telephone Encounter (Signed)
Patient failed to provide proof of income.  No additional medication assistance will be provided by [facility] without the required proof of income documentation.  Patient notified by letter.  Ander Wamser J. Denyce Harr Care Manager [facility] 

## 2018-07-11 ENCOUNTER — Encounter: Payer: Self-pay | Admitting: Cardiovascular Disease

## 2018-07-11 ENCOUNTER — Ambulatory Visit (INDEPENDENT_AMBULATORY_CARE_PROVIDER_SITE_OTHER): Payer: Self-pay | Admitting: Cardiovascular Disease

## 2018-07-11 VITALS — BP 154/76 | HR 57 | Ht 66.0 in | Wt 199.0 lb

## 2018-07-11 DIAGNOSIS — I639 Cerebral infarction, unspecified: Secondary | ICD-10-CM

## 2018-07-11 DIAGNOSIS — I214 Non-ST elevation (NSTEMI) myocardial infarction: Secondary | ICD-10-CM

## 2018-07-11 DIAGNOSIS — I10 Essential (primary) hypertension: Secondary | ICD-10-CM

## 2018-07-11 DIAGNOSIS — F1410 Cocaine abuse, uncomplicated: Secondary | ICD-10-CM

## 2018-07-11 MED ORDER — ATORVASTATIN CALCIUM 40 MG PO TABS: 40.0000 mg | ORAL_TABLET | Freq: Every day | ORAL | 6 refills | Status: DC

## 2018-07-11 MED ORDER — CLOPIDOGREL BISULFATE 75 MG PO TABS: 75.0000 mg | ORAL_TABLET | Freq: Every day | ORAL | 6 refills | Status: DC

## 2018-07-11 MED ORDER — LISINOPRIL 20 MG PO TABS: 20.0000 mg | ORAL_TABLET | Freq: Every day | ORAL | 6 refills | Status: DC

## 2018-07-11 NOTE — Patient Instructions (Addendum)
Medication Instructions:  Please stay on atorvastatin, clopidogrel, lisinopril, aspirin  If you need a refill on your cardiac medications before your next appointment, please call your pharmacy.    Lab work: No new labs needed   If you have labs (blood work) drawn today and your tests are completely normal, you will receive your results only by: Marland Kitchen. MyChart Message (if you have MyChart) OR . A paper copy in the mail If you have any lab test that is abnormal or we need to change your treatment, we will call you to review the results.   Testing/Procedures: No new testing needed   Follow-Up: At [facility], you and your health needs are our priority.  As part of our continuing mission to provide you with exceptional heart care, we have created designated Provider Care Teams.  These Care Teams include your primary Cardiologist (physician) and Advanced Practice Providers (APPs -  Physician Assistants and Nurse Practitioners) who all work together to provide you with the care you need, when you need it.  . You will need a follow up appointment in 6 months .   Please call our office 2 months in advance to schedule this appointment.    . Providers on your designated Care Team:   . Nicolasa Duckinghristopher Berge, NP . Eula Listenyan Dunn, PA-C . Marisue IvanJacquelyn Visser, PA-C  Any Other Special Instructions Will Be Listed Below (If Applicable).  For educational health videos Log in to : [email or web address] Or : [email or web address], password : [email or web address]

## 2018-07-11 NOTE — Progress Notes (Signed)
Cardiology Office Note  Date:  07/12/2018   ID:  Clinton Knight, DOB [birth year], MRN [id number]  PCP:  Patient, No Pcp Per   Chief Complaint  Patient presents with  . other    3 mo follow up. Medications reviewed verbally with patient.Only taking aspirin, unable to afford other medications.    HPI:  Clinton Knight is a 58 y.o. 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  Does not know where the paperwork is, not know if he has some of the forms that he needs. Has not taken medications for several weeks time  Otherwise reports that he feels relatively well Blood pressure running high today, discussed with him  EKG personally reviewed by myself on todays visit Shows normal sinus rhythm with rate 57 bpm no significant ST or T wave changes  We reviewed recent events with him in detail as this was the first time meeting him  [facility] 11/22 to 11/26 for NSTEMI.  LHC on 05/06/2018 that showed significant 2-vessel CAD involving the distal LCx supplying a small territory as well as diffuse disease in the diagonal branches. There was significant spasm noted in the RCA that improved with nitroglycerin. He had normal LVSF and LVEDP. The vessels were not amenable to PCI and medical management was advised.   currently smoking 2 to 3 cigarettes/day   He was last seen by Eula Listenyan Dunn, PA-C on 05/22/2018 for CAD   Other past medical history personally reviewed by me at today's visit: ECHO 03/20/2018 LV EF: 60% -   65%  normal wall motion, LV diastolic function normal, no source of emboli NSTEMI non-ST elevated MI 05/03/2018 Left Heart Cath and Coronary Angiography 05/06/2018 by  Dr. Kirke CorinArida   PMH:   has a past medical history of Acute kidney insufficiency, Alcohol abuse, Cocaine abuse (HCC), Diabetes (HCC), Hypertension, Hypertriglyceridemia, Nicotine abuse, and Stroke (HCC).  PSH:    Past Surgical History:  Procedure Laterality Date  . LEFT HEART CATH AND CORONARY ANGIOGRAPHY N/A 05/06/2018   Procedure: LEFT HEART CATH AND CORONARY ANGIOGRAPHY;  Surgeon: Iran OuchArida, Muhammad A, MD;  Location: [facility] INVASIVE CV LAB;  Service: Cardiovascular;  Laterality: N/A;  . NO PAST SURGERIES      Current Outpatient Medications  Medication Sig Dispense Refill  . aspirin 81 MG chewable tablet Chew 1 tablet (81 mg total) by mouth daily. 30 tablet 0  . acetaminophen (TYLENOL) 325 MG tablet Take 2 tablets (650 mg total) by mouth every 4 (four) hours as needed for mild pain (or temp > 37.5 C (99.5 F)). (Patient not taking: Reported on 07/11/2018)    . atorvastatin (LIPITOR) 40 MG tablet Take 1 tablet (40 mg total) by mouth daily at 6 PM. 30 tablet 6  . clopidogrel (PLAVIX) 75 MG tablet Take 1 tablet (75 mg total) by mouth daily. 30 tablet 6  . lisinopril (PRINIVIL,ZESTRIL) 20 MG tablet Take 1 tablet (20 mg total) by mouth daily. 30 tablet 6   No current facility-administered medications for this visit.      Allergies:   Patient has no known allergies.   Social History:  The patient  reports that he has been smoking cigarettes. He has been  smoking about 0.25 packs per day. He has never used smokeless tobacco. He reports current alcohol use. He reports current drug use. Drug: Cocaine.   Family History:   family history includes Diabetes in his mother; Heart attack (age of onset: 6940) in his brother; Hypertension in his mother; Stroke in his mother; Thyroid disease in his daughter.    Review of Systems: Review of Systems  Constitutional: Negative.   Respiratory: Negative.   Cardiovascular: Negative.   Gastrointestinal: Negative.   Musculoskeletal: Negative.   Neurological: Negative.    Psychiatric/Behavioral: Negative.   All other systems reviewed and are negative.   PHYSICAL EXAM: VS:  BP (!) 154/76 (BP Location: Left Arm, Patient Position: Sitting, Cuff Size: Normal)   Pulse (!) 57   Ht 5\' 6"  (1.676 m)   Wt 199 lb (90.3 kg)   BMI 32.12 kg/m  , BMI Body mass index is 32.12 kg/m. GEN: Well nourished, well developed, in no acute distress  HEENT: normal  Neck: no JVD, carotid bruits, or masses Cardiac: RRR; no murmurs, rubs, or gallops,no edema  Respiratory:  clear to auscultation bilaterally, normal work of breathing GI: soft, nontender, nondistended, + BS MS: no deformity or atrophy  Skin: warm and dry, no rash Neuro:  Strength and sensation are intact Psych: euthymic mood, full affect   Recent Labs: 03/21/2018: Magnesium 2.1; TSH 4.131 03/25/2018: ALT 30 05/07/2018: BUN 16; Creatinine, Ser 1.09; Hemoglobin 12.9; Platelets 235; Potassium 4.7; Sodium 138    Lipid Panel     Wt Readings from Last 3 Encounters:  07/11/18 199 lb (90.3 kg)  05/22/18 195 lb 8 oz (88.7 kg)  05/21/18 196 lb 3.2 oz (89 kg)       ASSESSMENT AND PLAN:  NSTEMI (non-ST elevated myocardial infarction) (HCC) - Plan: EKG 12-Lead Stressed importance of smoking cessation, avoiding cocaine Aggressive lipid management We have refilled his aspirin, Plavix, statin  Cerebrovascular accident (CVA), unspecified mechanism (HCC) - Plan: EKG 12-Lead Recommended strict blood pressure control, aspirin Plavix and statin  Cocaine abuse (HCC) Cessation recommended  Essential hypertension As he is medication noncompliant, we have consolidated his medications He is essentially not on any medications at this time We will give him a prescription for lisinopril 20 mg daily He does have some lower extremity edema, likely dependent edema and we will avoid amlodipine  Dispo We have given him new paperwork for medication management We have also printed up prescriptions and coupons for good  [email or web address]  Disposition:   F/U  6 months   Total encounter time more than 25 minutes  Greater than 50% was spent in counseling and coordination of care with the patient   Orders Placed This Encounter  Procedures  . EKG 12-Lead     Signed, Dossie Arbourim Elliotte Marsalis, M.D., Ph.D. 07/12/2018  [facility] [place] [phone]

## 2018-07-12 DIAGNOSIS — F1410 Cocaine abuse, uncomplicated: Secondary | ICD-10-CM | POA: Insufficient documentation

## 2018-11-07 ENCOUNTER — Other Ambulatory Visit: Payer: Self-pay

## 2018-11-07 ENCOUNTER — Emergency Department
Admission: EM | Admit: 2018-11-07 | Discharge: 2018-11-07 | Disposition: A | Discharge status: Home / Self Care | Payer: Self-pay | Attending: Emergency Medicine | Admitting: Emergency Medicine

## 2018-11-07 ENCOUNTER — Encounter: Payer: Self-pay | Admitting: Emergency Medicine

## 2018-11-07 DIAGNOSIS — Y999 Unspecified external cause status: Secondary | ICD-10-CM | POA: Insufficient documentation

## 2018-11-07 DIAGNOSIS — Z8673 Personal history of transient ischemic attack (TIA), and cerebral infarction without residual deficits: Secondary | ICD-10-CM | POA: Insufficient documentation

## 2018-11-07 DIAGNOSIS — E1122 Type 2 diabetes mellitus with diabetic chronic kidney disease: Secondary | ICD-10-CM | POA: Insufficient documentation

## 2018-11-07 DIAGNOSIS — Z7982 Long term (current) use of aspirin: Secondary | ICD-10-CM | POA: Insufficient documentation

## 2018-11-07 DIAGNOSIS — I129 Hypertensive chronic kidney disease with stage 1 through stage 4 chronic kidney disease, or unspecified chronic kidney disease: Secondary | ICD-10-CM | POA: Insufficient documentation

## 2018-11-07 DIAGNOSIS — Y9389 Activity, other specified: Secondary | ICD-10-CM | POA: Insufficient documentation

## 2018-11-07 DIAGNOSIS — I252 Old myocardial infarction: Secondary | ICD-10-CM | POA: Insufficient documentation

## 2018-11-07 DIAGNOSIS — N182 Chronic kidney disease, stage 2 (mild): Secondary | ICD-10-CM | POA: Insufficient documentation

## 2018-11-07 DIAGNOSIS — Y929 Unspecified place or not applicable: Secondary | ICD-10-CM | POA: Insufficient documentation

## 2018-11-07 DIAGNOSIS — W260XXA Contact with knife, initial encounter: Secondary | ICD-10-CM | POA: Insufficient documentation

## 2018-11-07 DIAGNOSIS — Z79899 Other long term (current) drug therapy: Secondary | ICD-10-CM | POA: Insufficient documentation

## 2018-11-07 DIAGNOSIS — S61412A Laceration without foreign body of left hand, initial encounter: Secondary | ICD-10-CM | POA: Insufficient documentation

## 2018-11-07 DIAGNOSIS — Z7902 Long term (current) use of antithrombotics/antiplatelets: Secondary | ICD-10-CM | POA: Insufficient documentation

## 2018-11-07 DIAGNOSIS — F17210 Nicotine dependence, cigarettes, uncomplicated: Secondary | ICD-10-CM | POA: Insufficient documentation

## 2018-11-07 NOTE — ED Triage Notes (Signed)
States he was cutting something outside  Knife slipped  Laceration noted to back of left hand

## 2018-11-07 NOTE — Discharge Instructions (Signed)
Do not get the sutured area wet for 24 hours. After 24 hours, shower/bathe as usual and pat the area dry. °Change the bandage 2 times per day and apply antibiotic ointment. °Leave open to air when at no risk of getting the area dirty, but cover at night before bed. °See your PCP or go to Urgent Care in 10 days for suture removal or sooner for signs or concern of infection. ° °

## 2018-11-07 NOTE — ED Provider Notes (Signed)
[facility] Emergency Department Provider Note  ____________________________________________  Time seen: Approximately 3:13 PM  I have reviewed the triage vital signs and the nursing notes.   HISTORY  Chief Complaint Laceration   HPI Clinton Knight is a 58 y.o. male who presents to the emergency department for treatment and evaluation of left hand laceration that will not stop bleeding.  Patient states that he was trying to cut something with the old knife when it slipped. He is on blood thinner and states that the wound has not stopped bleeding despite pressure since it occurred.  Tdap is up-to-date.  Past Medical History:  Diagnosis Date  . Acute kidney insufficiency    03/2018: Cr 1.1 in setting of stroke (baseline ~0.9)  . Alcohol abuse   . Cocaine abuse (HCC)    03/2018: Postitive cocaine UA tox screen  . Diabetes (HCC)    03/20/18: A1C 6.4  . Hypertension   . Hypertriglyceridemia    03/20/18 TG 511  . Nicotine abuse   . Stroke [facility](HCC)     Patient Active Problem List   Diagnosis Date Noted  . Cocaine abuse (HCC) 07/12/2018  . NSTEMI (non-ST elevated myocardial infarction) (HCC) 05/03/2018  . CKD (chronic kidney disease), stage II   . Sleep disturbance   . Diabetes mellitus type 2 in obese (HCC)   . Leukocytosis   . Slow transit constipation   . Essential hypertension   . 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 Sinus pause   . Stroke [facility](HCC) 03/19/2018    Past Surgical History:  Procedure Laterality Date  . LEFT HEART CATH AND CORONARY ANGIOGRAPHY N/A 05/06/2018   Procedure: LEFT HEART CATH AND CORONARY ANGIOGRAPHY;  Surgeon: Iran OuchArida, Muhammad A, MD;  Location: [facility] INVASIVE CV LAB;  Service: Cardiovascular;  Laterality: N/A;  . NO PAST SURGERIES      Prior to Admission medications   Medication Sig Start Date End Date Taking? Authorizing Provider  aspirin 81 MG chewable tablet Chew 1 tablet (81 mg total) by mouth daily. 05/07/18   Mayo, Allyn KennerKaty  Dodd, MD  atorvastatin (LIPITOR) 40 MG tablet Take 1 tablet (40 mg total) by mouth daily at 6 PM. 07/11/18   Gollan, Tollie Pizzaimothy J, MD  clopidogrel (PLAVIX) 75 MG tablet Take 1 tablet (75 mg total) by mouth daily. 07/11/18   Antonieta IbaGollan, Timothy J, MD  lisinopril (PRINIVIL,ZESTRIL) 20 MG tablet Take 1 tablet (20 mg total) by mouth daily. 07/11/18   Antonieta IbaGollan, Timothy J, MD    Allergies Patient has no known allergies.  Family History  Problem Relation Age of Onset  . Diabetes Mother   . Stroke Mother   . Hypertension Mother   . Heart attack Brother 8040       Brother, MI, 58 yo  . Thyroid disease Daughter        Removed her entire thyroid    Social History Social History   Tobacco Use  . Smoking status: Current Every Day Smoker    Packs/day: 0.25    Types: Cigarettes  . Smokeless tobacco: Never Used  . Tobacco comment: smoking 1 cigarette everyday.   Substance Use Topics  . Alcohol use: Yes  . Drug use: Yes    Types: Cocaine    Review of Systems  Constitutional: Negative for fever. Respiratory: Negative for cough or shortness of breath.  Musculoskeletal: Negative for myalgias Skin: Positive for laceration to the left hand Neurological: Negative for numbness or paresthesias. ____________________________________________   PHYSICAL EXAM:  VITAL SIGNS: ED Triage Vitals  Enc Vitals Group     BP 11/07/18 1036 131/68     Pulse Rate 11/07/18 1036 73     Resp 11/07/18 1036 16     Temp 11/07/18 1036 98 F (36.7 C)     Temp Source 11/07/18 1036 Oral     SpO2 11/07/18 1036 98 %     Weight 11/07/18 1037 190 lb (86.2 kg)     Height 11/07/18 1037 5\' 6"  (1.676 m)     Head Circumference --      Peak Flow --      Pain Score 11/07/18 1037 5     Pain Loc --      Pain Edu? --      Excl. in GC? --      Constitutional: Well appearing. Eyes: Conjunctivae are clear without discharge or drainage. Nose: No rhinorrhea noted. Mouth/Throat: Airway is patent.  Neck: No stridor. Unrestricted  range of motion observed. Cardiovascular: Capillary refill is <3 seconds.  Respiratory: Respirations are even and unlabored.. Musculoskeletal: Unrestricted range of motion observed, specifically of the left hand, index finger. Neurologic: Awake, alert, and oriented x 4.  Skin: 2 cm laceration to the dorsal aspect of the left hand overlying the first metatarsal.  ____________________________________________   LABS (all labs ordered are listed, but only abnormal results are displayed)  Labs Reviewed - No data to display ____________________________________________  EKG  Not indicated. ____________________________________________  RADIOLOGY  Not indicated ____________________________________________   PROCEDURES  .Marland KitchenLaceration Repair Date/Time: 11/07/2018 3:20 PM Performed by: Chinita Pester, FNP Authorized by: Chinita Pester, FNP   Consent:    Consent obtained:  Verbal   Consent given by:  Patient   Risks discussed:  Poor wound healing, poor cosmetic result and pain Anesthesia (see MAR for exact dosages):    Anesthesia method:  Local infiltration   Local anesthetic:  Lidocaine 2% WITH epi Laceration details:    Location:  Hand   Hand location:  L hand, dorsum   Length (cm):  2 Repair type:    Repair type:  Simple Exploration:    Hemostasis achieved with:  Epinephrine   Contaminated: no   Treatment:    Area cleansed with:  Betadine and saline   Amount of cleaning:  Standard   Irrigation method:  Syringe Skin repair:    Repair method:  Sutures   Suture size:  5-0   Suture material:  Nylon   Number of sutures:  4 Approximation:    Approximation:  Close Post-procedure details:    Dressing:  Antibiotic ointment and sterile dressing   Patient tolerance of procedure:  Tolerated well, no immediate complications   ____________________________________________   INITIAL IMPRESSION / ASSESSMENT AND PLAN / ED COURSE  Clinton Knight is a 58 y.o. male who  presents to the emergency department for treatment and evaluation of left hand laceration.  Laceration was cleaned and repaired as above.  Patient is to have the sutures removed in approximately 10 days.  He is to see his primary care provider, urgent care, or return to the ER for any symptoms of concern.   Medications - No data to display   Pertinent labs & imaging results that were available during my care of the patient were reviewed by me and considered in my medical decision making (see chart for details).  ____________________________________________   FINAL CLINICAL IMPRESSION(S) / ED DIAGNOSES  Final diagnoses:  Laceration of left hand without foreign body, initial encounter  ED Discharge Orders    None       Note:  This document was prepared using Dragon voice recognition software and may include unintentional dictation errors.   Chinita Pester, FNP 11/07/18 1521    Sharman Cheek, MD 11/10/18 Marlyne Beards

## 2018-11-18 ENCOUNTER — Emergency Department
Admission: EM | Admit: 2018-11-18 | Discharge: 2018-11-18 | Disposition: A | Discharge status: Home / Self Care | Payer: Self-pay | Attending: Emergency Medicine | Admitting: Emergency Medicine

## 2018-11-18 ENCOUNTER — Encounter: Payer: Self-pay | Admitting: Emergency Medicine

## 2018-11-18 ENCOUNTER — Other Ambulatory Visit: Payer: Self-pay

## 2018-11-18 DIAGNOSIS — Z4802 Encounter for removal of sutures: Secondary | ICD-10-CM | POA: Insufficient documentation

## 2018-11-18 DIAGNOSIS — F17210 Nicotine dependence, cigarettes, uncomplicated: Secondary | ICD-10-CM | POA: Insufficient documentation

## 2018-11-18 DIAGNOSIS — S61412D Laceration without foreign body of left hand, subsequent encounter: Secondary | ICD-10-CM | POA: Insufficient documentation

## 2018-11-18 DIAGNOSIS — Z79899 Other long term (current) drug therapy: Secondary | ICD-10-CM | POA: Insufficient documentation

## 2018-11-18 DIAGNOSIS — Z7982 Long term (current) use of aspirin: Secondary | ICD-10-CM | POA: Insufficient documentation

## 2018-11-18 DIAGNOSIS — Z7902 Long term (current) use of antithrombotics/antiplatelets: Secondary | ICD-10-CM | POA: Insufficient documentation

## 2018-11-18 DIAGNOSIS — N182 Chronic kidney disease, stage 2 (mild): Secondary | ICD-10-CM | POA: Insufficient documentation

## 2018-11-18 DIAGNOSIS — E1122 Type 2 diabetes mellitus with diabetic chronic kidney disease: Secondary | ICD-10-CM | POA: Insufficient documentation

## 2018-11-18 DIAGNOSIS — X58XXXD Exposure to other specified factors, subsequent encounter: Secondary | ICD-10-CM | POA: Insufficient documentation

## 2018-11-18 DIAGNOSIS — I129 Hypertensive chronic kidney disease with stage 1 through stage 4 chronic kidney disease, or unspecified chronic kidney disease: Secondary | ICD-10-CM | POA: Insufficient documentation

## 2018-11-18 NOTE — ED Provider Notes (Signed)
[facility] Emergency Department Provider Note  ____________________________________________   First MD Initiated Contact with Patient 11/18/18 [phone]     (approximate)  I have reviewed the triage vital signs and the nursing notes.   HISTORY  Chief Complaint Suture / Staple Removal  HPI Clinton Knight is a 58 y.o. male presents to the ED for suture removal from his left hand.  EGD on 11/04/2018 at which time his hand was sutured.  He denies any difficulties or signs of infection.  Currently rates pain as 0/10.     Past Medical History:  Diagnosis Date  . Acute kidney insufficiency    03/2018: Cr 1.1 in setting of stroke (baseline ~0.9)  . Alcohol abuse   . Cocaine abuse ([place])    03/2018: Postitive cocaine UA tox screen  . Diabetes ([place])    03/20/18: A1C 6.4  . Hypertension   . Hypertriglyceridemia    03/20/18 TG 511  . Nicotine abuse   . Stroke [facility])     Patient Active Problem List   Diagnosis Date Noted  . Cocaine abuse ([place]) 07/12/2018  . NSTEMI (non-ST elevated myocardial infarction) ([employer]) 05/03/2018  . CKD (chronic kidney disease), stage II   . Sleep disturbance   . Diabetes mellitus type 2 in obese ([place])   . Leukocytosis   . Slow transit constipation   . Essential hypertension   . Right pontine stroke ([place]) 03/22/2018  . Sinus pause   . Stroke [facility]) 03/19/2018    Past Surgical History:  Procedure Laterality Date  . LEFT HEART CATH AND CORONARY ANGIOGRAPHY N/A 05/06/2018   Procedure: LEFT HEART CATH AND CORONARY ANGIOGRAPHY;  Surgeon: Wellington Hampshire, MD;  Location: [place] CV LAB;  Service: Cardiovascular;  Laterality: N/A;  . NO PAST SURGERIES      Prior to Admission medications   Medication Sig Start Date End Date Taking? Authorizing Provider  aspirin 81 MG chewable tablet Chew 1 tablet (81 mg total) by mouth daily. 05/07/18   Mayo, Pete Pelt, MD  atorvastatin (LIPITOR) 40 MG tablet Take 1 tablet (40 mg total) by mouth  daily at 6 PM. 07/11/18   Gollan, Kathlene November, MD  clopidogrel (PLAVIX) 75 MG tablet Take 1 tablet (75 mg total) by mouth daily. 07/11/18   Minna Merritts, MD  lisinopril (PRINIVIL,ZESTRIL) 20 MG tablet Take 1 tablet (20 mg total) by mouth daily. 07/11/18   Minna Merritts, MD    Allergies Patient has no known allergies.  Family History  Problem Relation Age of Onset  . Diabetes Mother   . Stroke Mother   . Hypertension Mother   . Heart attack Brother 16       Brother, MI, 12 yo  . Thyroid disease Daughter        Removed her entire thyroid    Social History Social History   Tobacco Use  . Smoking status: Current Every Day Smoker    Packs/day: 0.25    Types: Cigarettes  . Smokeless tobacco: Never Used  . Tobacco comment: smoking 1 cigarette everyday.   Substance Use Topics  . Alcohol use: Yes  . Drug use: Yes    Types: Cocaine    Review of Systems Constitutional: No fever/chills Cardiovascular: Denies chest pain. Respiratory: Denies shortness of breath. Musculoskeletal: Negative for back pain. Skin: Healing laceration left hand. Neurological: Negative for  focal weakness or numbness. ___________________________________________   PHYSICAL EXAM:  VITAL SIGNS: ED Triage Vitals  Enc Vitals Group  BP 11/18/18 0832 120/80     Pulse Rate 11/18/18 0832 66     Resp 11/18/18 0832 16     Temp 11/18/18 0832 98.5 F (36.9 C)     Temp Source 11/18/18 0832 Oral     SpO2 11/18/18 0832 100 %     Weight 11/18/18 0833 200 lb (90.7 kg)     Height 11/18/18 0833 5\' 6"  (1.676 m)     Head Circumference --      Peak Flow --      Pain Score 11/18/18 0833 0     Pain Loc --      Pain Edu? --      Excl. in GC? --    Constitutional: Alert and oriented. Well appearing and in no acute distress. Eyes: Conjunctivae are normal.  Head: Atraumatic. Neck: No stridor.   Cardiovascular: Normal rate, regular rhythm. Grossly normal heart sounds.  Good peripheral circulation.  Respiratory: Normal respiratory effort.  No retractions. Lungs CTAB. Musculoskeletal: On examination of left hand there is sutures present on the dorsal aspect over the first metacarpal.  No evidence of infection or erythema was noted. Neurologic:  Normal speech and language. No gross focal neurologic deficits are appreciated. No gait instability. Skin:  Skin is warm, dry and intact. No rash noted. Psychiatric: Mood and affect are normal. Speech and behavior are normal.  ____________________________________________   LABS (all labs ordered are listed, but only abnormal results are displayed)  Labs Reviewed - No data to display  PROCEDURES  Procedure(s) performed (including Critical Care):  Procedures Sutures removed and Steri-Strips placed by RN Carmon SailsLinda McClamb  ____________________________________________   INITIAL IMPRESSION / ASSESSMENT AND PLAN / ED COURSE  As part of my medical decision making, I reviewed the following data within the electronic MEDICAL RECORD NUMBER Notes from prior ED visits and [place] Controlled Substance Database  Patient presents to the ED for suture removal.  He was seen in the ED on 11/07/2018 at which time he was sutured.  He denies any difficulty or drainage noted.  Sutures were removed and patient was discharged with instructions to continue cleaning the area and watch for any signs of infection.  There was 1 area that apparently a suture had broken at some time during the healing process and has opened slightly.  Steri-Strips were placed in this area and patient was made aware that he should let this fall off on its own.  ____________________________________________   FINAL CLINICAL IMPRESSION(S) / ED DIAGNOSES  Final diagnoses:  Encounter for removal of sutures     ED Discharge Orders    None       Note:  This document was prepared using Dragon voice recognition software and may include unintentional dictation errors.    Tommi RumpsSummers,  L, PA-C  11/18/18 1123    Emily FilbertWilliams, Jonathan E, MD 11/18/18 1255

## 2018-11-18 NOTE — Discharge Instructions (Addendum)
Continue to keep the area clean and dry.  Steri-Strips were placed in the area to hold the skin closed for several more days.  Let the Steri-Strips fall off on their own.  Watch for any signs of infection.  He can follow-up with your primary care provider or can no clinic acute care if any continued problems.

## 2018-11-18 NOTE — ED Triage Notes (Signed)
Pt is here for suture removal from the left hand 

## 2020-05-16 ENCOUNTER — Emergency Department: Payer: Self-pay

## 2020-05-16 ENCOUNTER — Encounter: Payer: Self-pay | Admitting: Emergency Medicine

## 2020-05-16 ENCOUNTER — Emergency Department
Admission: EM | Admit: 2020-05-16 | Discharge: 2020-05-16 | Disposition: A | Discharge status: Home / Self Care | Payer: Self-pay | Attending: Emergency Medicine | Admitting: Emergency Medicine

## 2020-05-16 ENCOUNTER — Other Ambulatory Visit: Payer: Self-pay

## 2020-05-16 DIAGNOSIS — N182 Chronic kidney disease, stage 2 (mild): Secondary | ICD-10-CM | POA: Insufficient documentation

## 2020-05-16 DIAGNOSIS — F17210 Nicotine dependence, cigarettes, uncomplicated: Secondary | ICD-10-CM | POA: Insufficient documentation

## 2020-05-16 DIAGNOSIS — Z7902 Long term (current) use of antithrombotics/antiplatelets: Secondary | ICD-10-CM | POA: Insufficient documentation

## 2020-05-16 DIAGNOSIS — E1122 Type 2 diabetes mellitus with diabetic chronic kidney disease: Secondary | ICD-10-CM | POA: Insufficient documentation

## 2020-05-16 DIAGNOSIS — Z79899 Other long term (current) drug therapy: Secondary | ICD-10-CM | POA: Insufficient documentation

## 2020-05-16 DIAGNOSIS — M79671 Pain in right foot: Secondary | ICD-10-CM

## 2020-05-16 DIAGNOSIS — I129 Hypertensive chronic kidney disease with stage 1 through stage 4 chronic kidney disease, or unspecified chronic kidney disease: Secondary | ICD-10-CM | POA: Insufficient documentation

## 2020-05-16 DIAGNOSIS — M19071 Primary osteoarthritis, right ankle and foot: Secondary | ICD-10-CM | POA: Insufficient documentation

## 2020-05-16 DIAGNOSIS — Z7982 Long term (current) use of aspirin: Secondary | ICD-10-CM | POA: Insufficient documentation

## 2020-05-16 MED ORDER — KETOROLAC TROMETHAMINE 30 MG/ML IJ SOLN
30.0000 mg | Freq: Once | INTRAMUSCULAR | Status: AC
  Administered 2020-05-16: 30 mg via INTRAMUSCULAR
  Filled 2020-05-16: qty 1

## 2020-05-16 MED ORDER — NAPROXEN 500 MG PO TABS: 500.0000 mg | ORAL_TABLET | Freq: Two times a day (BID) | ORAL | 0 refills | Status: DC

## 2020-05-16 NOTE — Discharge Instructions (Signed)
Follow up with Dr. Alberteen Spindle if any continued problems.  You will need to call make an appointment.  Begin taking naproxen 500 mg twice daily with food.  A prescription for 2 weeks was sent to your pharmacy.  You may also use ice or heat to your foot as needed for discomfort.  Wear shoes that are supportive.

## 2020-05-16 NOTE — ED Provider Notes (Signed)
[facility] Emergency Department Provider Note   ____________________________________________   First MD Initiated Contact with Patient 05/16/20 1149     (approximate)  I have reviewed the triage vital signs and the nursing notes.   HISTORY  Chief Complaint Foot Pain   HPI Clinton Knight is a 59 y.o. male presents to the ED with complaint of right foot pain for 1 month which is gradually been getting worse.  Patient denies any recent injury or prior difficulties with his foot.  He states he has been taking Tylenol without any relief.  Currently rates his pain as a 10/10.        Past Medical History:  Diagnosis Date  . Acute kidney insufficiency    03/2018: Cr 1.1 in setting of stroke (baseline ~0.9)  . Alcohol abuse   . Cocaine abuse (HCC)    03/2018: Postitive cocaine UA tox screen  . Diabetes (HCC)    03/20/18: A1C 6.4  . Hypertension   . Hypertriglyceridemia    03/20/18 TG 511  . Nicotine abuse   . Stroke [facility])     Patient Active Problem List   Diagnosis Date Noted  . Cocaine abuse (HCC) 07/12/2018  . NSTEMI (non-ST elevated myocardial infarction) (HCC) 05/03/2018  . CKD (chronic kidney disease), stage II   . Sleep disturbance   . Diabetes mellitus type 2 in obese (HCC)   . Leukocytosis   . Slow transit constipation   . Essential hypertension   . 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 Sinus pause   . Stroke [facility]) 03/19/2018    Past Surgical History:  Procedure Laterality Date  . LEFT HEART CATH AND CORONARY ANGIOGRAPHY N/A 05/06/2018   Procedure: LEFT HEART CATH AND CORONARY ANGIOGRAPHY;  Surgeon: Iran Ouch, MD;  Location: [facility] INVASIVE CV LAB;  Service: Cardiovascular;  Laterality: N/A;  . NO PAST SURGERIES      Prior to Admission medications   Medication Sig Start Date End Date Taking? Authorizing Provider  aspirin 81 MG chewable tablet Chew 1 tablet (81 mg total) by mouth daily. 05/07/18   Mayo, Allyn Kenner, MD    atorvastatin (LIPITOR) 40 MG tablet Take 1 tablet (40 mg total) by mouth daily at 6 PM. 07/11/18   Gollan, Tollie Pizza, MD  clopidogrel (PLAVIX) 75 MG tablet Take 1 tablet (75 mg total) by mouth daily. 07/11/18   Antonieta Iba, MD  lisinopril (PRINIVIL,ZESTRIL) 20 MG tablet Take 1 tablet (20 mg total) by mouth daily. 07/11/18   Antonieta Iba, MD  naproxen (NAPROSYN) 500 MG tablet Take 1 tablet (500 mg total) by mouth 2 (two) times daily with a meal. 05/16/20   Tommi Rumps, PA-C    Allergies Patient has no known allergies.  Family History  Problem Relation Age of Onset  . Diabetes Mother   . Stroke Mother   . Hypertension Mother   . Heart attack Brother 67       Brother, MI, 59 yo  . Thyroid disease Daughter        Removed her entire thyroid    Social History Social History   Tobacco Use  . Smoking status: Current Every Day Smoker    Packs/day: 0.25    Types: Cigarettes  . Smokeless tobacco: Never Used  . Tobacco comment: smoking 1 cigarette everyday.   Substance Use Topics  . Alcohol use: Yes  . Drug use: Yes    Types: Cocaine    Review of Systems  Constitutional: No fever/chills Eyes: No visual changes. Cardiovascular: Denies chest pain. Respiratory: Denies shortness of breath. Gastrointestinal: No abdominal pain.   Musculoskeletal: Positive right foot pain. Skin: Negative for rash. Neurological: Negative for headaches, focal weakness or numbness. ____________________________________________   PHYSICAL EXAM:  VITAL SIGNS: ED Triage Vitals  Enc Vitals Group     BP 05/16/20 1110 128/71     Pulse Rate 05/16/20 1110 (!) 56     Resp 05/16/20 1110 20     Temp 05/16/20 1110 97.8 F (36.6 C)     Temp Source 05/16/20 1110 Oral     SpO2 05/16/20 1110 98 %     Weight 05/16/20 1109 180 lb (81.6 kg)     Height 05/16/20 1109 5\' 6"  (1.676 m)     Head Circumference --      Peak Flow --      Pain Score 05/16/20 1109 10     Pain Loc --      Pain Edu? --       Excl. in GC? --     Constitutional: Alert and oriented. Well appearing and in no acute distress. Eyes: Conjunctivae are normal.  Head: Atraumatic. Neck: No stridor.   Cardiovascular: Normal rate, regular rhythm. Grossly normal heart sounds.  Good peripheral circulation. Respiratory: Normal respiratory effort.  No retractions. Lungs CTAB. Musculoskeletal: Examination of the right foot there is no gross deformity or evidence of injury such as abrasions or discoloration.  Pulses present.  There is tenderness on palpation of the right fifth metatarsal area.  Patient is able move digits without any difficulty.  Capillary refills less than 3 seconds and motor sensory function is intact.  There is no discoloration to the skin, abrasions or edema present. Neurologic:  Normal speech and language. No gross focal neurologic deficits are appreciated. No gait instability. Skin:  Skin is warm, dry and intact. No rash noted. Psychiatric: Mood and affect are normal. Speech and behavior are normal.  ____________________________________________   LABS (all labs ordered are listed, but only abnormal results are displayed)  Labs Reviewed - No data to display ____________________________________________  RADIOLOGY I, 14/05/21, personally viewed and evaluated these images (plain radiographs) as part of my medical decision making, as well as reviewing the written report by the radiologist.   Official radiology report(s): DG Foot Complete Right  Result Date: 05/16/2020 CLINICAL DATA:  Pain with walking. EXAM: RIGHT FOOT COMPLETE - 3+ VIEW COMPARISON:  None. FINDINGS: Degenerative changes at the first MTP joint. No fractures. No other acute abnormalities. IMPRESSION: Degenerative changes at the first MTP joint. No other acute abnormalities. Electronically Signed   By: 14/10/2019 III M.D   On: 05/16/2020 11:37    ____________________________________________   PROCEDURES  Procedure(s)  performed (including Critical Care):  Procedures   ____________________________________________   INITIAL IMPRESSION / ASSESSMENT AND PLAN / ED COURSE  As part of my medical decision making, I reviewed the following data within the electronic MEDICAL RECORD NUMBER Notes from prior ED visits and [place] Controlled Substance Database  59 year old male presents to the ED with complaint of right foot pain for 1 month without history of injury.  Patient denies any previous injury to his foot.  Physical exam is benign with the exception of some tenderness on palpation of the proximal fifth metatarsal.  X-ray is consistent with degenerative changes especially on the first metatarsal.  Patient is to follow-up with the podiatrist listed on his discharge papers.  A prescription for naproxen was  sent to the pharmacy to take twice a day with food.  ____________________________________________   FINAL CLINICAL IMPRESSION(S) / ED DIAGNOSES  Final diagnoses:  Acute foot pain, right  Osteoarthritis of right foot, unspecified osteoarthritis type     ED Discharge Orders         Ordered    naproxen (NAPROSYN) 500 MG tablet  2 times daily with meals        05/16/20 1201          *Please note:  Lonie E Sturgeon was evaluated in Emergency Department on 05/16/2020 for the symptoms described in the history of present illness. He was evaluated in the context of the global COVID-19 pandemic, which necessitated consideration that the patient might be at risk for infection with the SARS-CoV-2 virus that causes COVID-19. Institutional protocols and algorithms that pertain to the evaluation of patients at risk for COVID-19 are in a state of rapid change based on information released by regulatory bodies including the CDC and federal and state organizations. These policies and algorithms were followed during the patient's care in the ED.  Some ED evaluations and interventions may be delayed as a result of limited staffing  during and the pandemic.*   Note:  This document was prepared using Dragon voice recognition software and may include unintentional dictation errors.    Tommi Rumps, PA-C 05/16/20 1341    Minna Antis, MD 05/16/20 1446

## 2020-05-16 NOTE — ED Triage Notes (Signed)
Pt reports pain to right foot for a month getting worse. Pt denies obvious injuries

## 2020-07-23 ENCOUNTER — Telehealth: Payer: Self-pay | Admitting: Cardiovascular Disease

## 2020-07-23 NOTE — Telephone Encounter (Signed)
3 attempts to schedule fu appt from recall list.   Deleting recall.   

## 2020-09-10 ENCOUNTER — Emergency Department
Admission: EM | Admit: 2020-09-10 | Discharge: 2020-09-10 | Disposition: A | Discharge status: Home / Self Care | Payer: Self-pay | Attending: Emergency Medicine | Admitting: Emergency Medicine

## 2020-09-10 ENCOUNTER — Other Ambulatory Visit: Payer: Self-pay

## 2020-09-10 DIAGNOSIS — F17210 Nicotine dependence, cigarettes, uncomplicated: Secondary | ICD-10-CM | POA: Insufficient documentation

## 2020-09-10 DIAGNOSIS — R45851 Suicidal ideations: Secondary | ICD-10-CM | POA: Insufficient documentation

## 2020-09-10 DIAGNOSIS — Z5900 Homelessness unspecified: Secondary | ICD-10-CM | POA: Insufficient documentation

## 2020-09-10 DIAGNOSIS — F1994 Other psychoactive substance use, unspecified with psychoactive substance-induced mood disorder: Secondary | ICD-10-CM

## 2020-09-10 DIAGNOSIS — N182 Chronic kidney disease, stage 2 (mild): Secondary | ICD-10-CM | POA: Insufficient documentation

## 2020-09-10 DIAGNOSIS — I129 Hypertensive chronic kidney disease with stage 1 through stage 4 chronic kidney disease, or unspecified chronic kidney disease: Secondary | ICD-10-CM | POA: Insufficient documentation

## 2020-09-10 DIAGNOSIS — R0789 Other chest pain: Secondary | ICD-10-CM | POA: Insufficient documentation

## 2020-09-10 DIAGNOSIS — Z7982 Long term (current) use of aspirin: Secondary | ICD-10-CM | POA: Insufficient documentation

## 2020-09-10 DIAGNOSIS — Z20822 Contact with and (suspected) exposure to covid-19: Secondary | ICD-10-CM | POA: Insufficient documentation

## 2020-09-10 DIAGNOSIS — F1410 Cocaine abuse, uncomplicated: Secondary | ICD-10-CM | POA: Insufficient documentation

## 2020-09-10 DIAGNOSIS — Z8673 Personal history of transient ischemic attack (TIA), and cerebral infarction without residual deficits: Secondary | ICD-10-CM | POA: Insufficient documentation

## 2020-09-10 DIAGNOSIS — R079 Chest pain, unspecified: Secondary | ICD-10-CM

## 2020-09-10 DIAGNOSIS — Z79899 Other long term (current) drug therapy: Secondary | ICD-10-CM | POA: Insufficient documentation

## 2020-09-10 DIAGNOSIS — F1010 Alcohol abuse, uncomplicated: Secondary | ICD-10-CM | POA: Insufficient documentation

## 2020-09-10 DIAGNOSIS — Y900 Blood alcohol level of less than 20 mg/100 ml: Secondary | ICD-10-CM | POA: Insufficient documentation

## 2020-09-10 DIAGNOSIS — E1122 Type 2 diabetes mellitus with diabetic chronic kidney disease: Secondary | ICD-10-CM | POA: Insufficient documentation

## 2020-09-10 DIAGNOSIS — F329 Major depressive disorder, single episode, unspecified: Secondary | ICD-10-CM | POA: Insufficient documentation

## 2020-09-10 DIAGNOSIS — Z7902 Long term (current) use of antithrombotics/antiplatelets: Secondary | ICD-10-CM | POA: Insufficient documentation

## 2020-09-10 DIAGNOSIS — F32A Depression, unspecified: Secondary | ICD-10-CM

## 2020-09-10 LAB — COMPREHENSIVE METABOLIC PANEL
ALT: 22 U/L (ref 0–44)
AST: 27 U/L (ref 15–41)
Albumin: 4.1 g/dL (ref 3.5–5.0)
Alkaline Phosphatase: 94 U/L (ref 38–126)
Anion gap: 9 (ref 5–15)
BUN: 7 mg/dL (ref 6–20)
CO2: 23 mmol/L (ref 22–32)
Calcium: 9.4 mg/dL (ref 8.9–10.3)
Chloride: 103 mmol/L (ref 98–111)
Creatinine, Ser: 0.82 mg/dL (ref 0.61–1.24)
GFR, Estimated: >60 mL/min (ref >60)
Glucose, Bld: 103 mg/dL — ABNORMAL HIGH (ref 70–99)
Potassium: 4.3 mmol/L (ref 3.5–5.1)
Sodium: 135 mmol/L (ref 135–145)
Total Bilirubin: 0.8 mg/dL (ref 0.3–1.2)
Total Protein: 7.9 g/dL (ref 6.5–8.1)

## 2020-09-10 LAB — CBC
HCT: 47.8 % (ref 39.0–52.0)
Hemoglobin: 15.2 g/dL (ref 13.0–17.0)
MCH: 27.5 pg (ref 26.0–34.0)
MCHC: 31.8 g/dL (ref 30.0–36.0)
MCV: 86.6 fL (ref 80.0–100.0)
Platelets: 250 10*3/uL (ref 150–400)
RBC: 5.52 MIL/uL (ref 4.22–5.81)
RDW: 14.5 % (ref 11.5–15.5)
WBC: 7.1 10*3/uL (ref 4.0–10.5)
nRBC: 0 % (ref 0.0–0.2)

## 2020-09-10 LAB — SALICYLATE LEVEL: Salicylate Lvl: <7 mg/dL — ABNORMAL LOW (ref 7.0–30.0)

## 2020-09-10 LAB — RESP PANEL BY RT-PCR (FLU A&B, COVID) ARPGX2
Influenza A by PCR: NEGATIVE
Influenza B by PCR: NEGATIVE
SARS Coronavirus 2 by RT PCR: NEGATIVE

## 2020-09-10 LAB — ETHANOL: Alcohol, Ethyl (B): <10 mg/dL (ref <10)

## 2020-09-10 LAB — ACETAMINOPHEN LEVEL: Acetaminophen (Tylenol), Serum: <10 ug/mL — ABNORMAL LOW (ref 10–30)

## 2020-09-10 LAB — TROPONIN I (HIGH SENSITIVITY): Troponin I (High Sensitivity): 6 ng/L (ref <18)

## 2020-09-10 NOTE — ED Triage Notes (Signed)
Pt comes into the ED via [facility] from a hotel c/o CP and dizziness that started today a 9:00.  Unremarkable 12 lead with EMS. 140/80's, h/o TIA and MI.  Pt ran out of his home medication 6 months ago with little physician f/u.  Pt states he has not had any PO food or drink for 2 days except ETOH.

## 2020-09-10 NOTE — ED Triage Notes (Signed)
Pt to ED [facility] from hotel. Pt states he is overall not feeling well. States has CP earlier that has since resolved.  Pt states he has not been eating or sleeping lately because he became homeless within the last week and is stressed. States has been having thoughts of harming himself but will not share his plan and states he wants to keep it to himself.  Pt is very reserved in triage, talking quietly   Reports last using cocaine yesterday, last drinking yesteday

## 2020-09-10 NOTE — Discharge Instructions (Addendum)
Your cardiac work-up was negative and you were seen by psychiatry who cleared you for discharge

## 2020-09-10 NOTE — BH Assessment (Signed)
Comprehensive Clinical Assessment (CCA) Screening, Triage and Referral Note  09/10/2020 Clinton Knight [id number]   Clinton Knight is an 60 y.o male who presents to [facility] ED voluntarily for treatment. Per triage note, Pt to ED [facility] from hotel. Pt states he is overall not feeling well. States has CP earlier that has since resolved. Pt states he has not been eating or sleeping lately because he became homeless within the last week and is stressed. States has been having thoughts of harming himself but will not share his plan and states he wants to keep it to himself. Pt is very reserved in triage, talking quietly. Reports last using cocaine yesterday, last drinking yesterday.  During TTS assessment pt presents calm, depressed, soft spoke (mumbling) and oriented x 5, restless with some irritability but cooperative, and mood-congruent with affect. The pt does not appear to be responding to internal or external stimuli. Neither is the pt presenting with any delusional thinking. Pt was able to verify the information provided to triage RN. Pt identified his main compliant to be chest pains, dizzy spasms and depression. Pt reports the recent loss of his job and home due to his disability but was unclear as to what his disability was. Pt reports struggles with arthritis in his right foot after suffering 4 strokes and a heart attack in 2020. Pt expressed the need to apply for disability now that he is not working. Pt reports to feel depressed but was very vague with details and unable to identify any specific triggers stating, "just life". Pt identified lack of appetite, decreased sleep (5 hours), increase in SA and fleeting SI without a plan or intent as his current symptoms. Pt reports endorsing the identified symptoms for "a while".  Pt denies a hx of suicide attempts but reports a family hx of suicide (cousin). Pt reports a MH hx of MDD and addictions but denies compliance with medications or treatment.  Pt reports  weekly use of cocaine and alcohol only and identified his last use of both to be yesterday. Pt reports an INPT hx "years ago" in [place] but is currently unable to provide further information. Pt denies OPT hx and reports a family hx of SA (Alcohol) only. Pt reports to be homeless and expressed to need help with his depression and a place to "think". Pt denies any current SI/HI/AH/VH and asked to be admitted to the hospital.   Per Dr. Toni Amend pt does not meet criteria for INPT  Chief Complaint:  Chief Complaint  Patient presents with  . Suicidal  . Depression   Visit Diagnosis: Substance induced mood disorder  Patient Reported Information How did you hear about [place]? Self   Referral name: Self   Referral phone number: No data recorded Whom do you see for routine medical problems? I don't have a doctor   Practice/Facility Name: No data recorded  Practice/Facility Phone Number: No data recorded  Name of Contact: No data recorded  Contact Number: No data recorded  Contact Fax Number: No data recorded  Prescriber Name: No data recorded  Prescriber Address (if known): No data recorded What Is the Reason for Your Visit/Call Today? SI, Depression  How Long Has This Been Causing You Problems? <Week  Have You Recently Been in Any Inpatient Treatment (Hospital/Detox/Crisis Center/28-Day Program)? No   Name/Location of Program/Hospital:No data recorded  How Long Were You There? No data recorded  When Were You Discharged? No data recorded Have You Ever Received Services From [facility] Before? Yes  Who Do You See at [facility]? ED  Have You Recently Had Any Thoughts About Hurting Yourself? Yes   Are You Planning to Commit Suicide/Harm Yourself At This time?  No  Have you Recently Had Thoughts About Hurting Someone Karolee Ohs? No   Explanation: No data recorded Have You Used Any Alcohol or Drugs in the Past 24 Hours? Yes   How Long Ago Did You Use Drugs or Alcohol?  No data  recorded  What Did You Use and How Much? Cocaine & beer  What Do You Feel Would Help You the Most Today? Alcohol or Drug Use Treatment  Do You Currently Have a Therapist/Psychiatrist? No   Name of Therapist/Psychiatrist: No data recorded  Have You Been Recently Discharged From Any Office Practice or Programs? No   Explanation of Discharge From Practice/Program:  No data recorded    CCA Screening Triage Referral Assessment Type of Contact: Face-to-Face   Is this Initial or Reassessment? No data recorded  Date Telepsych consult ordered in [facility]:  No data recorded  Time Telepsych consult ordered in [facility]:  No data recorded Patient Reported Information Reviewed? Yes   Patient Left Without Being Seen? No data recorded  Reason for Not Completing Assessment: No data recorded Collateral Involvement: None provided  Does Patient Have a Court Appointed Legal Guardian? No data recorded  Name and Contact of Legal Guardian:  No data recorded If Minor and Not Living with Parent(s), Who has Custody? n/a  Is CPS involved or ever been involved? Never  Is APS involved or ever been involved? Never  Patient Determined To Be At Risk for Harm To Self or Others Based on Review of Patient Reported Information or Presenting Complaint? No   Method: No data recorded  Availability of Means: No data recorded  Intent: No data recorded  Notification Required: No data recorded  Additional Information for Danger to Others Potential:  No data recorded  Additional Comments for Danger to Others Potential:  No data recorded  Are There Guns or Other Weapons in Your Home?  No data recorded   Types of Guns/Weapons: No data recorded   Are These Weapons Safely Secured?                              No data recorded   Who Could Verify You Are Able To Have These Secured:    No data recorded Do You Have any Outstanding Charges, Pending Court Dates, Parole/Probation? No data recorded Contacted To Inform of Risk of Harm  To Self or Others: No data recorded Location of Assessment: [facility] ED  Does Patient Present under Involuntary Commitment? No   IVC Papers Initial File Date: No data recorded  [place] of Residence: [place]  Patient Currently Receiving the Following Services: Not Receiving Services   Determination of Need: Emergent (2 hours)   Options For Referral: Outpatient Therapy   Opal Sidles, LCSWA

## 2020-09-10 NOTE — ED Provider Notes (Signed)
[facility] Emergency Department Provider Note  Time seen: 1:33 PM  I have reviewed the triage vital signs and the nursing notes.   HISTORY  Chief Complaint Suicidal   HPI Clinton Knight is a 60 y.o. male with a past medical history of alcohol abuse, crack cocaine use, diabetes, hypertension, presents to the emergency department for chest discomfort.  According to the patient last night he was having some chest pain but states he has been under tremendous amount of stress recently  as he has recently become homeless.  While here patient also states vague suicidal ideation although denies any plan to kill himself or hurt himself.  No HI.  Patient last used alcohol and cocaine yesterday.  No chest pain currently.  Patient states he has been off of his medications for approximately 6 months.  Past Medical History:  Diagnosis Date  . Acute kidney insufficiency    03/2018: Cr 1.1 in setting of stroke (baseline ~0.9)  . Alcohol abuse   . Cocaine abuse (HCC)    03/2018: Postitive cocaine UA tox screen  . Diabetes (HCC)    03/20/18: A1C 6.4  . Hypertension   . Hypertriglyceridemia    03/20/18 TG 511  . Nicotine abuse   . Stroke [facility])     Patient Active Problem List   Diagnosis Date Noted  . Cocaine abuse (HCC) 07/12/2018  . NSTEMI (non-ST elevated myocardial infarction) (HCC) 05/03/2018  . CKD (chronic kidney disease), stage II   . Sleep disturbance   . Diabetes mellitus type 2 in obese (HCC)   . Leukocytosis   . Slow transit constipation   . Essential hypertension   . 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 Sinus pause   . Stroke [facility]) 03/19/2018    Past Surgical History:  Procedure Laterality Date  . LEFT HEART CATH AND CORONARY ANGIOGRAPHY N/A 05/06/2018   Procedure: LEFT HEART CATH AND CORONARY ANGIOGRAPHY;  Surgeon: Iran Ouch, MD;  Location: [facility] INVASIVE CV LAB;  Service: Cardiovascular;  Laterality: N/A;  . NO PAST SURGERIES      Prior  to Admission medications   Medication Sig Start Date End Date Taking? Authorizing Provider  aspirin 81 MG chewable tablet Chew 1 tablet (81 mg total) by mouth daily. 05/07/18   Mayo, Allyn Kenner, MD  atorvastatin (LIPITOR) 40 MG tablet Take 1 tablet (40 mg total) by mouth daily at 6 PM. 07/11/18   Gollan, Tollie Pizza, MD  clopidogrel (PLAVIX) 75 MG tablet Take 1 tablet (75 mg total) by mouth daily. 07/11/18   Antonieta Iba, MD  lisinopril (PRINIVIL,ZESTRIL) 20 MG tablet Take 1 tablet (20 mg total) by mouth daily. 07/11/18   Antonieta Iba, MD  naproxen (NAPROSYN) 500 MG tablet Take 1 tablet (500 mg total) by mouth 2 (two) times daily with a meal. 05/16/20   Bridget Hartshorn L, PA-C    No Known Allergies  Family History  Problem Relation Age of Onset  . Diabetes Mother   . Stroke Mother   . Hypertension Mother   . Heart attack Brother 65       Brother, MI, 74 yo  . Thyroid disease Daughter        Removed her entire thyroid    Social History Social History   Tobacco Use  . Smoking status: Current Every Day Smoker    Packs/day: 0.25    Types: Cigarettes  . Smokeless tobacco: Never Used  . Tobacco comment: smoking 1 cigarette everyday.  Substance Use Topics  . Alcohol use: Yes  . Drug use: Yes    Types: Cocaine    Review of Systems Constitutional: Negative for fever. Cardiovascular: Mild dull chest pain last night.  States it has been intermittent for several weeks.  None currently. Respiratory: Negative for shortness of breath. Gastrointestinal: Negative for abdominal pain, vomiting Musculoskeletal: Negative for musculoskeletal complaints Neurological: Negative for headache All other ROS negative  ____________________________________________   PHYSICAL EXAM:  VITAL SIGNS: ED Triage Vitals [09/10/20 1227]  Enc Vitals Group     BP 131/83     Pulse Rate (!) 57     Resp 18     Temp 98.2 F (36.8 C)     Temp Source Oral     SpO2 97 %     Weight 175 lb (79.4 kg)      Height 5\' 6"  (1.676 m)     Head Circumference      Peak Flow      Pain Score 0     Pain Loc      Pain Edu?      Excl. in GC?    Constitutional: Alert and oriented. Well appearing and in no distress. Eyes: Normal exam ENT      Head: Normocephalic and atraumatic.      Mouth/Throat: Mucous membranes are moist. Cardiovascular: Normal rate, regular rhythm.  Respiratory: Normal respiratory effort without tachypnea nor retractions. Breath sounds are clear  Gastrointestinal: Soft and nontender. No distention.   Musculoskeletal: Nontender with normal range of motion in all extremities.  Neurologic:  Normal speech and language. No gross focal neurologic deficits  Skin:  Skin is warm, dry and intact.  Psychiatric: Mood and affect are normal.   ____________________________________________    EKG  EKG viewed and interpreted by myself shows sinus bradycardia 58 bpm with a narrow QRS, normal axis, normal intervals, nonspecific ST changes  ____________________________________________    INITIAL IMPRESSION / ASSESSMENT AND PLAN / ED COURSE  Pertinent labs & imaging results that were available during my care of the patient were reviewed by me and considered in my medical decision making (see chart for details).   Patient presents emergency department with chest pain intermittent over the past several weeks but occurred last night.  States alcohol and crack cocaine use last night as well.  Patient states he has not been sleeping or eating well he is under a lot of stress and has recently become homeless.  Patient states vague SI when asked specifically about other states he would never hurt himself and has no active plans.  We will check labs including cardiac enzymes we will continue to closely monitor and have psychiatry involved.  Patient's labs are reassuring including a normal blood glucose and a negative troponin.  Vitals are reassuring as well including his blood pressure.  EKG shows no  concerning findings.  We will have psychiatry evaluate the patient however I do not believe that this time the patient meets IVC criteria.  Clinton Knight was evaluated in Emergency Department on 09/10/2020 for the symptoms described in the history of present illness. He was evaluated in the context of the global COVID-19 pandemic, which necessitated consideration that the patient might be at risk for infection with the SARS-CoV-2 virus that causes COVID-19. Institutional protocols and algorithms that pertain to the evaluation of patients at risk for COVID-19 are in a state of rapid change based on information released by regulatory bodies including the CDC and federal and state  organizations. These policies and algorithms were followed during the patient's care in the ED.  ____________________________________________   FINAL CLINICAL IMPRESSION(S) / ED DIAGNOSES  Depression Chest pain   Minna Antis, MD 09/10/20 1520

## 2020-09-10 NOTE — ED Provider Notes (Signed)
4:34 PM Assumed care for off going team.   Blood pressure 131/83, pulse (!) 57, temperature 98.2 F (36.8 C), temperature source Oral, resp. rate 18, height 5\' 6"  (1.676 m), weight 79.4 kg, SpO2 97 %.  See their HPI for full report but in brief Vol- vague SI but Negative chest pain workup    Patient was seen by psychiatry.  Please see his note for more details but cleared for discharge home       , MD 09/10/20 [phone]

## 2020-09-10 NOTE — ED Notes (Signed)
Cab voucher given to patient for Homeless shelter.

## 2020-09-10 NOTE — Consult Note (Signed)
[facility]   Reason for Consult: Consult for 60 year old man with a long history of substance abuse who came to the hospital initially with a complaint of chest pain and then later revealed suicidal thoughts Referring Physician: Paduchowski Patient Identification: Clinton Knight MRN:  [id number] Principal Diagnosis: Substance induced mood disorder (HCC) Diagnosis:  Principal Problem:   Substance induced mood disorder (HCC) Active Problems:   Cocaine abuse (HCC)   Total Time spent with patient: 1 hour  Subjective:   Clinton Knight is a 60 y.o. male patient admitted with "I am homeless".  HPI: Patient seen chart reviewed.  This gentleman came to the emergency room after calling an ambulance on himself apparently.  He initially said he was having chest pain but later told intake staff he was having suicidal thoughts as well and was placed in a psychiatric room.  He repeats this statement about chest pain to me but does not emphasize it and does not seem to be in physical discomfort.  He is evasive during the interview frequently covering his mouth speaking in a mutter declining to answer a lot of specific questions.  Patient says he lost his home on Tuesday.  Since then he has been staying "nowhere".  He says that he was having suicidal thoughts.  I expressed empathy and ask him if he could talk more about it and describe been and all he could come up with was "I thought about suicide that is all".  Did not have any method that he could describe.  Patient admits he has been using crack cocaine regularly.  Again somewhat evasive about the amount or frequency.  Tells me he has been drinking but the amount is "not a lot".  Not getting any outpatient treatment.  States multiple times that outpatient treatment is impossible for him and that he has to be admitted somewhere to the hospital.  Past Psychiatric History: Patient has been seen several times before by the psychiatric  service under similar circumstances.  Is been several years since he was seen.  He has in the past made suicidal statements but there is no history of him ever actually trying to kill himself.  All of his presentations have been during periods of substance use.  Documented in the past that he has had multiple rehab stays without any ongoing sobriety.  No consistent medication treatment.  Risk to Self:   Risk to Others:   Prior Inpatient Therapy:   Prior Outpatient Therapy:    Past Medical History:  Past Medical History:  Diagnosis Date  . Acute kidney insufficiency    03/2018: Cr 1.1 in setting of stroke (baseline ~0.9)  . Alcohol abuse   . Cocaine abuse (HCC)    03/2018: Postitive cocaine UA tox screen  . Diabetes (HCC)    03/20/18: A1C 6.4  . Hypertension   . Hypertriglyceridemia    03/20/18 TG 511  . Nicotine abuse   . Stroke [facility](HCC)     Past Surgical History:  Procedure Laterality Date  . LEFT HEART CATH AND CORONARY ANGIOGRAPHY N/A 05/06/2018   Procedure: LEFT HEART CATH AND CORONARY ANGIOGRAPHY;  Surgeon: Iran OuchArida, Muhammad A, MD;  Location: [facility] INVASIVE CV LAB;  Service: Cardiovascular;  Laterality: N/A;  . NO PAST SURGERIES     Family History:  Family History  Problem Relation Age of Onset  . Diabetes Mother   . Stroke Mother   . Hypertension Mother   . Heart attack Brother 40  Brother, MI, 45 yo  . Thyroid disease Daughter        Removed her entire thyroid   Family Psychiatric  History: None reported Social History:  Social History   Substance and Sexual Activity  Alcohol Use Yes     Social History   Substance and Sexual Activity  Drug Use Yes  . Types: Cocaine    Social History   Socioeconomic History  . Marital status: Single    Spouse name: Not on file  . Number of children: Not on file  . Years of education: Not on file  . Highest education level: Not on file  Occupational History  . Not on file  Tobacco Use  . Smoking status: Current Every  Day Smoker    Packs/day: 0.25    Types: Cigarettes  . Smokeless tobacco: Never Used  . Tobacco comment: smoking 1 cigarette everyday.   Substance and Sexual Activity  . Alcohol use: Yes  . Drug use: Yes    Types: Cocaine  . Sexual activity: Not on file  Other Topics Concern  . Not on file  Social History Narrative  . Not on file   Social Determinants of Health   Financial Resource Strain: Not on file  Food Insecurity: Not on file  Transportation Needs: Not on file  Physical Activity: Not on file  Stress: Not on file  Social Connections: Not on file   Additional Social History:    Allergies:  No Known Allergies  Labs:  Results for orders placed or performed during the hospital encounter of 09/10/20 (from the past 48 hour(s))  Comprehensive metabolic panel     Status: Abnormal   Collection Time: 09/10/20 12:33 PM  Result Value Ref Range   Sodium 135 135 - 145 mmol/L   Potassium 4.3 3.5 - 5.1 mmol/L   Chloride 103 98 - 111 mmol/L   CO2 23 22 - 32 mmol/L   Glucose, Bld 103 (H) 70 - 99 mg/dL    Comment: Glucose reference range applies only to samples taken after fasting for at least 8 hours.   BUN 7 6 - 20 mg/dL   Creatinine, Ser 7.02 0.61 - 1.24 mg/dL   Calcium 9.4 8.9 - 63.7 mg/dL   Total Protein 7.9 6.5 - 8.1 g/dL   Albumin 4.1 3.5 - 5.0 g/dL   AST 27 15 - 41 U/L   ALT 22 0 - 44 U/L   Alkaline Phosphatase 94 38 - 126 U/L   Total Bilirubin 0.8 0.3 - 1.2 mg/dL   GFR, Estimated >85 >88 mL/min    Comment: (NOTE) Calculated using the CKD-EPI Creatinine Equation (2021)    Anion gap 9 5 - 15    Comment: Performed at [facility], [address] [place], [place] [address]  Ethanol     Status: None   Collection Time: 09/10/20 12:33 PM  Result Value Ref Range   Alcohol, Ethyl (B) <10 <10 mg/dL    Comment: (NOTE) Lowest detectable limit for serum alcohol is 10 mg/dL.  For medical purposes only. Performed at [facility], [address]  [place], [place] [address]   cbc     Status: None   Collection Time: 09/10/20 12:33 PM  Result Value Ref Range   WBC 7.1 4.0 - 10.5 K/uL   RBC 5.52 4.22 - 5.81 MIL/uL   Hemoglobin 15.2 13.0 - 17.0 g/dL   HCT 86.7 67.2 - 09.4 %   MCV 86.6 80.0 - 100.0 fL  MCH 27.5 26.0 - 34.0 pg   MCHC 31.8 30.0 - 36.0 g/dL   RDW 16.1 09.6 - 04.5 %   Platelets 250 150 - 400 K/uL   nRBC 0.0 0.0 - 0.2 %    Comment: Performed at [facility], [address] [place], [place] [address]  Troponin I (High Sensitivity)     Status: None   Collection Time: 09/10/20 12:33 PM  Result Value Ref Range   Troponin I (High Sensitivity) 6 <18 ng/L    Comment: (NOTE) Elevated high sensitivity troponin I (hsTnI) values and significant  changes across serial measurements may suggest ACS but many other  chronic and acute conditions are known to elevate hsTnI results.  Refer to the "Links" section for chest pain algorithms and additional  guidance. Performed at [facility], [address] [place], [place] [address]   Acetaminophen level     Status: Abnormal   Collection Time: 09/10/20  2:19 PM  Result Value Ref Range   Acetaminophen (Tylenol), Serum <10 (L) 10 - 30 ug/mL    Comment: (NOTE) Therapeutic concentrations vary significantly. A range of 10-30 ug/mL  may be an effective concentration for many patients. However, some  are best treated at concentrations outside of this range. Acetaminophen concentrations >150 ug/mL at 4 hours after ingestion  and >50 ug/mL at 12 hours after ingestion are often associated with  toxic reactions.  Performed at [facility], [address] [place], [place] [address]   Salicylate level     Status: Abnormal   Collection Time: 09/10/20  2:19 PM  Result Value Ref Range   Salicylate Lvl <7.0 (L) 7.0 - 30.0 mg/dL    Comment: Performed at [facility], [address] [place], [place] [address]  Resp Panel by RT-PCR (Flu A&B, Covid)  Nasopharyngeal Swab     Status: None   Collection Time: 09/10/20  2:24 PM   Specimen: Nasopharyngeal Swab; Nasopharyngeal(NP) swabs in vial transport medium  Result Value Ref Range   SARS Coronavirus 2 by RT PCR NEGATIVE NEGATIVE    Comment: (NOTE) SARS-CoV-2 target nucleic acids are NOT DETECTED.  The SARS-CoV-2 RNA is generally detectable in upper respiratory specimens during the acute phase of infection. The lowest concentration of SARS-CoV-2 viral copies this assay can detect is 138 copies/mL. A negative result does not preclude SARS-Cov-2 infection and should not be used as the sole basis for treatment or other patient management decisions. A negative result may occur with  improper specimen collection/handling, submission of specimen other than nasopharyngeal swab, presence of viral mutation(s) within the areas targeted by this assay, and inadequate number of viral copies(<138 copies/mL). A negative result must be combined with clinical observations, patient history, and epidemiological information. The expected result is Negative.  Fact Sheet for Patients:  BloggerCourse.com  Fact Sheet for Healthcare Providers:  SeriousBroker.it  This test is no t yet approved or cleared by the [place] FDA and  has been authorized for detection and/or diagnosis of SARS-CoV-2 by FDA under an Emergency Use Authorization (EUA). This EUA will remain  in effect (meaning this test can be used) for the duration of the COVID-19 declaration under Section 564(b)(1) of the Act, 21 U.S.C.section 360bbb-3(b)(1), unless the authorization is terminated  or revoked sooner.       Influenza A by PCR NEGATIVE NEGATIVE   Influenza B by PCR NEGATIVE NEGATIVE    Comment: (NOTE) The Xpert Xpress SARS-CoV-2/FLU/RSV plus assay is intended as an aid in  the diagnosis of influenza from Nasopharyngeal swab specimens and should not be used as a sole basis for  treatment. Nasal washings and aspirates are unacceptable for Xpert Xpress SARS-CoV-2/FLU/RSV testing.  Fact Sheet for Patients: BloggerCourse.com  Fact Sheet for Healthcare Providers: SeriousBroker.it  This test is not yet approved or cleared by the [place] FDA and has been authorized for detection and/or diagnosis of SARS-CoV-2 by FDA under an Emergency Use Authorization (EUA). This EUA will remain in effect (meaning this test can be used) for the duration of the COVID-19 declaration under Section 564(b)(1) of the Act, 21 U.S.C. section 360bbb-3(b)(1), unless the authorization is terminated or revoked.  Performed at [facility], [address] [place], [place] [address]     No current facility-administered medications for this encounter.   Current Outpatient Medications  Medication Sig Dispense Refill  . aspirin 81 MG chewable tablet Chew 1 tablet (81 mg total) by mouth daily. 30 tablet 0  . atorvastatin (LIPITOR) 40 MG tablet Take 1 tablet (40 mg total) by mouth daily at 6 PM. 30 tablet 6  . clopidogrel (PLAVIX) 75 MG tablet Take 1 tablet (75 mg total) by mouth daily. 30 tablet 6  . lisinopril (PRINIVIL,ZESTRIL) 20 MG tablet Take 1 tablet (20 mg total) by mouth daily. 30 tablet 6  . naproxen (NAPROSYN) 500 MG tablet Take 1 tablet (500 mg total) by mouth 2 (two) times daily with a meal. 30 tablet 0    Musculoskeletal: Strength & Muscle Tone: within normal limits Gait & Station: normal Patient leans: N/A            Psychiatric Specialty Exam:  Presentation  General Appearance: No data recorded Eye Contact:No data recorded Speech:No data recorded Speech Volume:No data recorded Handedness:No data recorded  Mood and Affect  Mood:No data recorded Affect:No data recorded  Thought Process  Thought Processes:No data recorded Descriptions of Associations:No data recorded Orientation:No data  recorded Thought Content:No data recorded History of Schizophrenia/Schizoaffective disorder:No data recorded Duration of Psychotic Symptoms:No data recorded Hallucinations:No data recorded Ideas of Reference:No data recorded Suicidal Thoughts:No data recorded Homicidal Thoughts:No data recorded  Sensorium  Memory:No data recorded Judgment:No data recorded Insight:No data recorded  Executive Functions  Concentration:No data recorded Attention Span:No data recorded Recall:No data recorded Fund of Knowledge:No data recorded Language:No data recorded  Psychomotor Activity  Psychomotor Activity:No data recorded  Assets  Assets:No data recorded  Sleep  Sleep:No data recorded  Physical Exam: Physical Exam Vitals and nursing note reviewed.  Constitutional:      Appearance: Normal appearance.  HENT:     Head: Normocephalic and atraumatic.     Mouth/Throat:     Pharynx: Oropharynx is clear.  Eyes:     Pupils: Pupils are equal, round, and reactive to light.  Cardiovascular:     Rate and Rhythm: Normal rate and regular rhythm.  Pulmonary:     Effort: Pulmonary effort is normal.     Breath sounds: Normal breath sounds.  Abdominal:     General: Abdomen is flat.     Palpations: Abdomen is soft.  Musculoskeletal:        General: Normal range of motion.  Skin:    General: Skin is warm and dry.  Neurological:     General: No focal deficit present.     Mental Status: He is alert. Mental status is at baseline.  Psychiatric:        Attention and Perception: He is inattentive.  Mood and Affect: Mood is depressed.        Speech: Speech is slurred.        Behavior: Behavior is withdrawn.        Thought Content: Thought content is not paranoid or delusional. Thought content includes suicidal ideation. Thought content does not include homicidal ideation. Thought content does not include suicidal plan.        Cognition and Memory: Cognition normal.        Judgment: Judgment  normal.    Review of Systems  Constitutional: Negative.   HENT: Negative.   Eyes: Negative.   Respiratory: Negative.   Cardiovascular: Positive for chest pain.  Gastrointestinal: Negative.   Musculoskeletal: Negative.   Skin: Negative.   Neurological: Negative.   Psychiatric/Behavioral: Positive for depression, substance abuse and suicidal ideas. Negative for hallucinations and memory loss. The patient has insomnia. The patient is not nervous/anxious.    Blood pressure 131/83, pulse (!) 57, temperature 98.2 F (36.8 C), temperature source Oral, resp. rate 18, height 5\' 6"  (1.676 m), weight 79.4 kg, SpO2 97 %. Body mass index is 28.25 kg/m.  Treatment Plan Summary: Plan 60 year old man comes in with several complaints among them suicidal ideation without any intention or plan of hurting himself.  He is not psychotic.  He is lucid and calm.  Currently has a drug screen positive for cocaine but no alcohol in his system.  Does not appear to be in acute alcohol withdrawal and does not have a past history of seizures.  No indication for psychiatric hospitalization.  Case will be reviewed with emergency physician and TTS.  Recommended that patient can be discharged to outpatient mental health and substance abuse follow-up with [facility].  Disposition: No evidence of imminent risk to self or others at present.   Patient does not meet criteria for psychiatric inpatient admission. Supportive therapy provided about ongoing stressors. Discussed crisis plan, support from social network, calling 911, coming to the Emergency Department, and calling Suicide Hotline.  46, MD 09/10/2020 3:32 PM

## 2020-09-10 NOTE — ED Notes (Signed)
Pt given dinner tray.

## 2020-09-10 NOTE — ED Notes (Signed)
Pt dressed out with this RN and SN Grayce Sessions. Belongings include: Black shoes Black hoodie Black tshirt [employer] 2 book bags full of clothes, soap, other belongings cellphone [employer]  Black socks  Pt placed some clothes taken off into own bookbags

## 2021-01-17 ENCOUNTER — Observation Stay: Payer: Self-pay

## 2021-01-17 ENCOUNTER — Inpatient Hospital Stay
Admission: EM | Admit: 2021-01-17 | Discharge: 2021-01-19 | DRG: 066 | Disposition: A | Discharge status: Home / Self Care | Payer: Self-pay | Attending: Internal Medicine | Admitting: Internal Medicine

## 2021-01-17 ENCOUNTER — Encounter: Payer: Self-pay | Admitting: *Deleted

## 2021-01-17 ENCOUNTER — Other Ambulatory Visit: Payer: Self-pay

## 2021-01-17 ENCOUNTER — Emergency Department: Payer: Self-pay

## 2021-01-17 DIAGNOSIS — I639 Cerebral infarction, unspecified: Secondary | ICD-10-CM | POA: Diagnosis present

## 2021-01-17 DIAGNOSIS — J40 Bronchitis, not specified as acute or chronic: Secondary | ICD-10-CM

## 2021-01-17 DIAGNOSIS — Z79899 Other long term (current) drug therapy: Secondary | ICD-10-CM

## 2021-01-17 DIAGNOSIS — Z7982 Long term (current) use of aspirin: Secondary | ICD-10-CM

## 2021-01-17 DIAGNOSIS — I10 Essential (primary) hypertension: Secondary | ICD-10-CM | POA: Diagnosis present

## 2021-01-17 DIAGNOSIS — Z720 Tobacco use: Secondary | ICD-10-CM

## 2021-01-17 DIAGNOSIS — Z7902 Long term (current) use of antithrombotics/antiplatelets: Secondary | ICD-10-CM

## 2021-01-17 DIAGNOSIS — I2510 Atherosclerotic heart disease of native coronary artery without angina pectoris: Secondary | ICD-10-CM | POA: Diagnosis present

## 2021-01-17 DIAGNOSIS — F1910 Other psychoactive substance abuse, uncomplicated: Secondary | ICD-10-CM | POA: Diagnosis present

## 2021-01-17 DIAGNOSIS — I6350 Cerebral infarction due to unspecified occlusion or stenosis of unspecified cerebral artery: Secondary | ICD-10-CM | POA: Diagnosis present

## 2021-01-17 DIAGNOSIS — F1410 Cocaine abuse, uncomplicated: Secondary | ICD-10-CM

## 2021-01-17 DIAGNOSIS — E1169 Type 2 diabetes mellitus with other specified complication: Secondary | ICD-10-CM | POA: Diagnosis present

## 2021-01-17 DIAGNOSIS — R519 Headache, unspecified: Secondary | ICD-10-CM

## 2021-01-17 DIAGNOSIS — Z9114 Patient's other noncompliance with medication regimen: Secondary | ICD-10-CM

## 2021-01-17 DIAGNOSIS — I6329 Cerebral infarction due to unspecified occlusion or stenosis of other precerebral arteries: Principal | ICD-10-CM | POA: Diagnosis present

## 2021-01-17 DIAGNOSIS — E781 Pure hyperglyceridemia: Secondary | ICD-10-CM | POA: Diagnosis present

## 2021-01-17 DIAGNOSIS — E119 Type 2 diabetes mellitus without complications: Secondary | ICD-10-CM | POA: Diagnosis present

## 2021-01-17 DIAGNOSIS — Z823 Family history of stroke: Secondary | ICD-10-CM

## 2021-01-17 DIAGNOSIS — Z833 Family history of diabetes mellitus: Secondary | ICD-10-CM

## 2021-01-17 DIAGNOSIS — Z5900 Homelessness unspecified: Secondary | ICD-10-CM

## 2021-01-17 DIAGNOSIS — R059 Cough, unspecified: Secondary | ICD-10-CM

## 2021-01-17 DIAGNOSIS — F17210 Nicotine dependence, cigarettes, uncomplicated: Secondary | ICD-10-CM | POA: Diagnosis present

## 2021-01-17 DIAGNOSIS — Z20822 Contact with and (suspected) exposure to covid-19: Secondary | ICD-10-CM | POA: Diagnosis present

## 2021-01-17 DIAGNOSIS — Z9119 Patient's noncompliance with other medical treatment and regimen: Secondary | ICD-10-CM

## 2021-01-17 DIAGNOSIS — E669 Obesity, unspecified: Secondary | ICD-10-CM | POA: Diagnosis present

## 2021-01-17 DIAGNOSIS — Z8249 Family history of ischemic heart disease and other diseases of the circulatory system: Secondary | ICD-10-CM

## 2021-01-17 DIAGNOSIS — F17200 Nicotine dependence, unspecified, uncomplicated: Secondary | ICD-10-CM | POA: Diagnosis present

## 2021-01-17 DIAGNOSIS — Z91148 Patient's other noncompliance with medication regimen for other reason: Secondary | ICD-10-CM

## 2021-01-17 DIAGNOSIS — E785 Hyperlipidemia, unspecified: Secondary | ICD-10-CM | POA: Diagnosis present

## 2021-01-17 DIAGNOSIS — I6523 Occlusion and stenosis of bilateral carotid arteries: Secondary | ICD-10-CM | POA: Diagnosis present

## 2021-01-17 LAB — LIPID PANEL
Cholesterol: 188 mg/dL (ref 0–200)
HDL: 45 mg/dL (ref >40)
LDL Cholesterol: 96 mg/dL (ref 0–99)
Total CHOL/HDL Ratio: 4.2 RATIO
Triglycerides: 237 mg/dL — ABNORMAL HIGH (ref <150)
VLDL: 47 mg/dL — ABNORMAL HIGH (ref 0–40)

## 2021-01-17 LAB — COMPREHENSIVE METABOLIC PANEL
ALT: 20 U/L (ref 0–44)
AST: 22 U/L (ref 15–41)
Albumin: 3.6 g/dL (ref 3.5–5.0)
Alkaline Phosphatase: 93 U/L (ref 38–126)
Anion gap: 5 (ref 5–15)
BUN: 13 mg/dL (ref 6–20)
CO2: 25 mmol/L (ref 22–32)
Calcium: 8.6 mg/dL — ABNORMAL LOW (ref 8.9–10.3)
Chloride: 110 mmol/L (ref 98–111)
Creatinine, Ser: 0.91 mg/dL (ref 0.61–1.24)
GFR, Estimated: >60 mL/min (ref >60)
Glucose, Bld: 107 mg/dL — ABNORMAL HIGH (ref 70–99)
Potassium: 3.9 mmol/L (ref 3.5–5.1)
Sodium: 140 mmol/L (ref 135–145)
Total Bilirubin: 0.5 mg/dL (ref 0.3–1.2)
Total Protein: 6.9 g/dL (ref 6.5–8.1)

## 2021-01-17 LAB — HEMOGLOBIN A1C
Hgb A1c MFr Bld: 6.3 % — ABNORMAL HIGH (ref 4.8–5.6)
Mean Plasma Glucose: 134.11 mg/dL

## 2021-01-17 LAB — CBC
HCT: 40.8 % (ref 39.0–52.0)
Hemoglobin: 13.1 g/dL (ref 13.0–17.0)
MCH: 28.8 pg (ref 26.0–34.0)
MCHC: 32.1 g/dL (ref 30.0–36.0)
MCV: 89.7 fL (ref 80.0–100.0)
Platelets: 256 10*3/uL (ref 150–400)
RBC: 4.55 MIL/uL (ref 4.22–5.81)
RDW: 14.2 % (ref 11.5–15.5)
WBC: 11 10*3/uL — ABNORMAL HIGH (ref 4.0–10.5)
nRBC: 0 % (ref 0.0–0.2)

## 2021-01-17 LAB — ETHANOL: Alcohol, Ethyl (B): 64 mg/dL — ABNORMAL HIGH (ref <10)

## 2021-01-17 LAB — RESP PANEL BY RT-PCR (FLU A&B, COVID) ARPGX2
Influenza A by PCR: NEGATIVE
Influenza B by PCR: NEGATIVE
SARS Coronavirus 2 by RT PCR: NEGATIVE

## 2021-01-17 LAB — HIV ANTIBODY (ROUTINE TESTING W REFLEX): HIV Screen 4th Generation wRfx: NONREACTIVE

## 2021-01-17 MED ORDER — CLOPIDOGREL BISULFATE 75 MG PO TABS
75.0000 mg | ORAL_TABLET | Freq: Every day | ORAL | Status: DC
  Administered 2021-01-17 – 2021-01-19 (×3): 75 mg via ORAL
  Filled 2021-01-17 (×3): qty 1

## 2021-01-17 MED ORDER — SODIUM CHLORIDE 0.9 % IV SOLN: 250.0000 mL | INTRAVENOUS | Status: DC | PRN

## 2021-01-17 MED ORDER — LORAZEPAM 1 MG PO TABS: 1.0000 mg | ORAL_TABLET | ORAL | Status: DC | PRN

## 2021-01-17 MED ORDER — ASPIRIN 81 MG PO CHEW
81.0000 mg | CHEWABLE_TABLET | Freq: Every day | ORAL | Status: DC
  Administered 2021-01-18 – 2021-01-19 (×2): 81 mg via ORAL
  Filled 2021-01-17 (×2): qty 1

## 2021-01-17 MED ORDER — SODIUM CHLORIDE 0.9 % IV SOLN: INTRAVENOUS | Status: DC

## 2021-01-17 MED ORDER — LORAZEPAM 2 MG PO TABS: 0.0000 mg | ORAL_TABLET | Freq: Two times a day (BID) | ORAL | Status: DC

## 2021-01-17 MED ORDER — DIPHENHYDRAMINE HCL 25 MG PO CAPS
25.0000 mg | ORAL_CAPSULE | Freq: Once | ORAL | Status: AC
  Administered 2021-01-17: 25 mg via ORAL
  Filled 2021-01-17: qty 1

## 2021-01-17 MED ORDER — ATORVASTATIN CALCIUM 20 MG PO TABS
80.0000 mg | ORAL_TABLET | Freq: Every day | ORAL | Status: DC
  Administered 2021-01-18 – 2021-01-19 (×2): 80 mg via ORAL
  Filled 2021-01-17 (×2): qty 4

## 2021-01-17 MED ORDER — THIAMINE HCL 100 MG PO TABS
100.0000 mg | ORAL_TABLET | Freq: Every day | ORAL | Status: DC
  Administered 2021-01-17 – 2021-01-19 (×3): 100 mg via ORAL
  Filled 2021-01-17 (×3): qty 1

## 2021-01-17 MED ORDER — LORAZEPAM 2 MG/ML IJ SOLN: 1.0000 mg | INTRAMUSCULAR | Status: DC | PRN

## 2021-01-17 MED ORDER — ASPIRIN 81 MG PO CHEW
81.0000 mg | CHEWABLE_TABLET | Freq: Every day | ORAL | 0 refills | Status: DC
  Filled 2021-01-17: qty 30, 30d supply, fill #0

## 2021-01-17 MED ORDER — SODIUM CHLORIDE 0.9% FLUSH: 3.0000 mL | INTRAVENOUS | Status: DC | PRN

## 2021-01-17 MED ORDER — STROKE: EARLY STAGES OF RECOVERY BOOK: Freq: Once | Status: DC

## 2021-01-17 MED ORDER — FOLIC ACID 1 MG PO TABS
1.0000 mg | ORAL_TABLET | Freq: Every day | ORAL | Status: DC
  Administered 2021-01-17 – 2021-01-19 (×3): 1 mg via ORAL
  Filled 2021-01-17 (×3): qty 1

## 2021-01-17 MED ORDER — ADULT MULTIVITAMIN W/MINERALS CH
1.0000 | ORAL_TABLET | Freq: Every day | ORAL | Status: DC
  Administered 2021-01-17 – 2021-01-19 (×3): 1 via ORAL
  Filled 2021-01-17 (×3): qty 1

## 2021-01-17 MED ORDER — ATORVASTATIN CALCIUM 20 MG PO TABS
40.0000 mg | ORAL_TABLET | Freq: Every day | ORAL | Status: DC
  Administered 2021-01-17: 40 mg via ORAL
  Filled 2021-01-17: qty 2

## 2021-01-17 MED ORDER — ATORVASTATIN CALCIUM 40 MG PO TABS
40.0000 mg | ORAL_TABLET | Freq: Every day | ORAL | 6 refills | Status: DC
  Filled 2021-01-17: qty 30, 30d supply, fill #0

## 2021-01-17 MED ORDER — SODIUM CHLORIDE 0.9% FLUSH
3.0000 mL | Freq: Two times a day (BID) | INTRAVENOUS | Status: DC
  Administered 2021-01-18: 3 mL via INTRAVENOUS

## 2021-01-17 MED ORDER — ACETAMINOPHEN 650 MG RE SUPP: 650.0000 mg | RECTAL | Status: DC | PRN

## 2021-01-17 MED ORDER — CLOPIDOGREL BISULFATE 75 MG PO TABS: 75.0000 mg | ORAL_TABLET | Freq: Every day | ORAL | 6 refills | Status: DC

## 2021-01-17 MED ORDER — THIAMINE HCL 100 MG/ML IJ SOLN: 100.0000 mg | Freq: Every day | INTRAMUSCULAR | Status: DC

## 2021-01-17 MED ORDER — ASPIRIN 81 MG PO CHEW
324.0000 mg | CHEWABLE_TABLET | Freq: Once | ORAL | Status: AC
  Administered 2021-01-17: 324 mg via ORAL
  Filled 2021-01-17: qty 4

## 2021-01-17 MED ORDER — ACETAMINOPHEN 160 MG/5ML PO SOLN
650.0000 mg | ORAL | Status: DC | PRN
  Filled 2021-01-17: qty 20.3

## 2021-01-17 MED ORDER — ACETAMINOPHEN 325 MG PO TABS
650.0000 mg | ORAL_TABLET | ORAL | Status: DC | PRN
  Administered 2021-01-18: 650 mg via ORAL
  Filled 2021-01-17: qty 2

## 2021-01-17 MED ORDER — PROCHLORPERAZINE EDISYLATE 10 MG/2ML IJ SOLN
10.0000 mg | Freq: Once | INTRAMUSCULAR | Status: AC
  Administered 2021-01-17: 10 mg via INTRAVENOUS
  Filled 2021-01-17: qty 2

## 2021-01-17 MED ORDER — NICOTINE 21 MG/24HR TD PT24
21.0000 mg | MEDICATED_PATCH | Freq: Every day | TRANSDERMAL | Status: DC
  Administered 2021-01-17 – 2021-01-19 (×3): 21 mg via TRANSDERMAL
  Filled 2021-01-17 (×3): qty 1

## 2021-01-17 MED ORDER — MAGNESIUM SULFATE 2 GM/50ML IV SOLN
2.0000 g | Freq: Once | INTRAVENOUS | Status: AC
  Administered 2021-01-17: 2 g via INTRAVENOUS
  Filled 2021-01-17: qty 50

## 2021-01-17 MED ORDER — LACTATED RINGERS IV BOLUS
1000.0000 mL | Freq: Once | INTRAVENOUS | Status: AC
  Administered 2021-01-17: 1000 mL via INTRAVENOUS

## 2021-01-17 MED ORDER — LORAZEPAM 2 MG PO TABS: 0.0000 mg | ORAL_TABLET | Freq: Four times a day (QID) | ORAL | Status: AC

## 2021-01-17 NOTE — ED Notes (Signed)
Pt to MRI

## 2021-01-17 NOTE — Progress Notes (Signed)
OT Cancellation Note  Patient Details Name: Clinton Knight MRN: [id number] DOB: 1960/10/06   Cancelled Treatment:    Reason Eval/Treat Not Completed: Patient at procedure or test/ unavailable. OT order received and chart reviewed. Pt currently off the unit for MRI. OT will re-attempt when pt is next available.   Jackquline Denmark, MS, OTR/L , CBIS ascom [phone]  01/17/21, 11:29 AM

## 2021-01-17 NOTE — Evaluation (Signed)
Occupational Therapy Evaluation Patient Details Name: Clinton Knight MRN: [id number] DOB: Aug 02, 1960 Today's Date: 01/17/2021    History of Present Illness Clinton Knight is a 60 y.o. male with medical history significant for hypertension noncompliant with prescribed medications, history of migraine headaches, history of prior CVA with no focal deficits who presents to the ER via EMS for evaluation of a 6-day history of left-sided headache. 01/17/21 MRI: Acute bilateral occipital lobe (larger on the left ) and left posterior temporal and parietal lobe infarcts   Clinical Impression   Clinton Knight was seen for OT evaluation this date. Prior to hospital admission, pt was MOD I for mobility and ADLs. Pt lives with sister in 1 level home. Pt presents to acute OT demonstrating impaired ADL performance and functional mobility 2/2 functional coordination/balance deficits and visual deficits. Pt currently requires SBA + HHA for standing funcitonal balance tasks reaching inside/outside BOS - assist 2/2 lethargy. MOD I self drinking - increased time to open soda tab. Currently pt presenting with decreased FMC to screw/unscrew nut and bolt, dropping screw in dominant R hand. Pt instructed on stroke lifestyle changes and visual scanning techniques.   Pt completed Albert's Test - a screening tool used to detect the presence of unilateral spatial neglect (USN) in patients with stroke with no USN indicated. Pt would benefit from skilled OT to address noted impairments and functional limitations (see below for any additional details) in order to maximize safety and independence while minimizing falls risk and caregiver burden. Upon hospital discharge, recommend HHOT to maximize pt safety and return to functional independence during meaningful occupations of daily life.     Follow Up Recommendations  Home health OT    Equipment Recommendations  None recommended by OT    Recommendations for Other Services        Precautions / Restrictions Precautions Precautions: Fall Restrictions Weight Bearing Restrictions: No      Mobility Bed Mobility Overal bed mobility: Modified Independent                  Transfers Overall transfer level: Needs assistance Equipment used: 1 person hand held assist Transfers: Sit to/from Stand Sit to Stand: Min guard              Balance Overall balance assessment: Needs assistance Sitting-balance support: Feet supported;No upper extremity supported Sitting balance-Leahy Scale: Good     Standing balance support: Single extremity supported;During functional activity Standing balance-Leahy Scale: Fair                             ADL either performed or assessed with clinical judgement   ADL Overall ADL's : Needs assistance/impaired                                       General ADL Comments: SBA + HHA for standing funcitonal balance tasks reaching inside/outside BOS - assist 2/2 lethargy. MOD I self drinking - increased time to open soda tab     Vision Baseline Vision/History: No visual deficits Patient Visual Report: Blurring of vision;Eye fatigue/eye pain/headache Vision Assessment?: Vision impaired- to be further tested in functional context Additional Comments: pt completed line bisection test with no impairment noted            Pertinent Vitals/Pain Pain Assessment: Faces Faces Pain Scale: Hurts a little bit Pain Location: headache  Pain Descriptors / Indicators: Headache Pain Intervention(s): Repositioned     Hand Dominance Right   Extremity/Trunk Assessment Upper Extremity Assessment Upper Extremity Assessment: RUE deficits/detail;LUE deficits/detail RUE Deficits / Details: 4+/5 grossly, FNF intact, 5 finger opposition intact RUE Coordination: decreased fine motor LUE Deficits / Details: 4+/5 grossly, FNF intact, 5 finger opposition intact   Lower Extremity Assessment Lower Extremity Assessment:  Overall WFL for tasks assessed       Communication Communication Communication: No difficulties (lethargic)   Cognition Arousal/Alertness: Lethargic Behavior During Therapy: WFL for tasks assessed/performed Overall Cognitive Status: Within Functional Limits for tasks assessed                                 General Comments: answers questions appropriately with increased time/reprtition likelt r/t lethargy (eyes closed intermittetnly t/o session)   General Comments       Exercises Exercises: Other exercises Other Exercises Other Exercises: Pt educated re; OT roel, DME recs, d/c recs, falls prevention, stroke education Other Exercises: LBD, functinoal reach, sup<>sit, sit<>stand, sitting/standing balance/tolerance, Frederica Kuster test   Shoulder Instructions      Home Living Family/patient expects to be discharged to:: Private residence Living Arrangements: Other (Comment) (sister) Available Help at Discharge: Family Type of Home: House Home Access: Stairs to enter Secretary/administrator of Steps: 1-2 Entrance Stairs-Rails: None Home Layout: One level     Bathroom Shower/Tub: Chief Strategy Officer: Standard     Home Equipment: Cane - single point          Prior Functioning/Environment Level of Independence: Independent with assistive device(s)        Comments: MOD I using SPC for ADLs and mobility, does not work, lives with sister who works full time        OT Problem List: Decreased activity tolerance;Impaired balance (sitting and/or standing);Decreased coordination      OT Treatment/Interventions: Therapeutic exercise;Self-care/ADL training;Energy conservation;DME and/or AE instruction;Therapeutic activities;Patient/family education;Balance training    OT Goals(Current goals can be found in the care plan section) Acute Rehab OT Goals Patient Stated Goal: to feel better OT Goal Formulation: With patient Time For Goal Achievement:  01/31/21 Potential to Achieve Goals: Good ADL Goals Pt Will Perform Eating: Independently (including light meal preparation) Pt Will Transfer to Toilet: Independently;ambulating;regular height toilet Additional ADL Goal #1: Pt will Independetly verbalize plan to implement x3 visual scanning strategies Additional ADL Goal #2: Pt will Independetly verbalize plan to implement x3 lifestyle changes for stroke management  OT Frequency: Min 2X/week    AM-PAC OT "6 Clicks" Daily Activity     Outcome Measure Help from another person eating meals?: None Help from another person taking care of personal grooming?: A Little Help from another person toileting, which includes using toliet, bedpan, or urinal?: A Little Help from another person bathing (including washing, rinsing, drying)?: A Little Help from another person to put on and taking off regular upper body clothing?: None Help from another person to put on and taking off regular lower body clothing?: A Little 6 Click Score: 20   End of Session Nurse Communication: Mobility status  Activity Tolerance: Patient tolerated treatment well Patient left: in bed;with call bell/phone within reach;with bed alarm set  OT Visit Diagnosis: Muscle weakness (generalized) (M62.81)                Time: 7867-5449 OT Time Calculation (min): 11 min Charges:  OT General Charges $OT Visit:  1 Visit OT Evaluation $OT Eval Low Complexity: 1 Low  Kathie Dike, M.S. OTR/L  01/17/21, 3:54 PM  ascom [phone]

## 2021-01-17 NOTE — ED Triage Notes (Signed)
Pt reports migraine headache for about a week. Also has a cough. Under a lot of stress, pt says he also wants help with his drinking. Last beer around 2300

## 2021-01-17 NOTE — ED Triage Notes (Signed)
[place] [facility] [place] [facility] [place]. Per medic report, pt C/o headache, recently homeless, wants help with alcohol, no SI. Alert and oriented. Been off medication (BP meds) for about a month.  125/90, hr 89, 98% RA.

## 2021-01-17 NOTE — ED Provider Notes (Signed)
[facility] Emergency Department Provider Note  ____________________________________________   Event Date/Time   First MD Initiated Contact with Patient 01/17/21 [phone]     (approximate)  I have reviewed the triage vital signs and the nursing notes.   HISTORY  Chief Complaint Headache   HPI Clinton Knight is a 60 y.o. male with Paschal history of TIA, DM, HTN, HDL and CAD as well as polysubstance abuse including tobacco and cocaine presents for assessment of several concerns.  Patient states he has had a headache for about a week associated with some intermittent colorful spots in his vision.  He denies any trauma, double vision, blurry vision, pain in his eyes, vertigo, neck pain, sore throat or any other pain of both shoulders.  States it is generalized and throbbing.  He also states is about a week of a cough.  He denies any new shortness of breath, fevers, chest pain, abdominal pain, nausea, vomiting, diarrhea, dysuria, rash or any other clear associated sick symptoms.  States he last used cocaine yesterday and had a beer yesterday and today but does not drink more than 1-2 beers daily.  States he would like help with his cocaine abuse.  He notes he has not taken his medications in several months to affordability issues.         Past Medical History:  Diagnosis Date   Acute kidney insufficiency    03/2018: Cr 1.1 in setting of stroke (baseline ~0.9)   Alcohol abuse    Cocaine abuse (HCC)    03/2018: Postitive cocaine UA tox screen   Diabetes (HCC)    03/20/18: A1C 6.4   Hypertension    Hypertriglyceridemia    03/20/18 TG 511   Nicotine abuse    Stroke [facility])     Patient Active Problem List   Diagnosis Date Noted   Acute CVA (cerebrovascular accident) (HCC) 01/17/2021   Substance induced mood disorder (HCC) 09/10/2020   Cocaine abuse (HCC) 07/12/2018   NSTEMI (non-ST elevated myocardial infarction) (HCC) 05/03/2018   CKD (chronic kidney disease),  stage II    Sleep disturbance    Diabetes mellitus type 2 in obese (HCC)    Leukocytosis    Slow transit constipation    Essential hypertension    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 Authorizing Provider  aspirin 81 MG chewable tablet Chew 1 tablet (81 mg total) by mouth once daily. 01/17/21   Gilles Chiquito, MD  atorvastatin (LIPITOR) 40 MG tablet Take 1 tablet (40 mg total) by mouth once daily at 6 PM. 01/17/21   Gilles Chiquito, MD  clopidogrel (PLAVIX) 75 MG tablet Take 1 tablet (75 mg total) by mouth daily. 01/17/21   Gilles Chiquito, MD  lisinopril (PRINIVIL,ZESTRIL) 20 MG tablet Take 1 tablet (20 mg total) by mouth daily. 07/11/18   Antonieta Iba, MD  naproxen (NAPROSYN) 500 MG tablet Take 1 tablet (500 mg total) by mouth 2 (two) times daily with a meal. 05/16/20   Tommi Rumps, PA-C  Allergies Patient has no known allergies.  Family History  Problem Relation Age of Onset   Diabetes Mother    Stroke Mother    Hypertension Mother    Heart attack Brother [id number]       Brother, MI, 60 yo   Thyroid disease Daughter        Removed her entire thyroid    Social History Social History   Tobacco Use   Smoking status: Every Day    Packs/day: 0.25    Types: Cigarettes   Smokeless tobacco: Never   Tobacco comments:    smoking 1 cigarette everyday.   Substance Use Topics   Alcohol use: Yes   Drug use: Yes    Types: Cocaine    Review of Systems  Review of Systems  Constitutional:  Negative for chills and fever.  HENT:  Negative for sore throat.    Eyes:  Negative for pain.  Respiratory:  Positive for cough. Negative for stridor.   Cardiovascular:  Negative for chest pain.  Gastrointestinal:  Negative for vomiting.  Genitourinary:  Negative for dysuria.  Musculoskeletal:  Negative for myalgias.  Skin:  Negative for rash.  Neurological:  Positive for headaches. Negative for seizures and loss of consciousness.  Psychiatric/Behavioral:  Negative for suicidal ideas.   All other systems reviewed and are negative.    ____________________________________________   PHYSICAL EXAM:  VITAL SIGNS: ED Triage Vitals  Enc Vitals Group     BP 01/17/21 0456 116/66     Pulse Rate 01/17/21 0456 71     Resp 01/17/21 0456 18     Temp 01/17/21 0456 98.6 F (37 C)     Temp Source 01/17/21 0456 Oral     SpO2 01/17/21 0456 100 %     Weight --      Height --      Head Circumference --      Peak Flow --      Pain Score 01/17/21 0458 10     Pain Loc --      Pain Edu? --      Excl. in GC? --    Vitals:   01/17/21 0456 01/17/21 0930  BP: 116/66 (!) 150/116  Pulse: 71   Resp: 18 (!) 23  Temp: 98.6 F (37 C)   SpO2: 100%    Physical Exam Vitals and nursing note reviewed.  Constitutional:      Appearance: He is well-developed.  HENT:     Head: Normocephalic and atraumatic.     Right Ear: External ear normal.     Left Ear: External ear normal.     Nose: Nose normal.  Eyes:     Conjunctiva/sclera: Conjunctivae normal.  Cardiovascular:     Rate and Rhythm: Normal rate and regular rhythm.     Pulses: Normal pulses.     Heart sounds: No murmur heard. Pulmonary:     Effort: Pulmonary effort is normal. No respiratory distress.     Breath sounds: Normal breath sounds.  Abdominal:     Palpations: Abdomen is soft.     Tenderness: There is no abdominal tenderness. There is no right CVA tenderness or left CVA tenderness.  Musculoskeletal:     Cervical back: Neck supple.  Skin:    General: Skin is warm and dry.     Capillary Refill:  Capillary refill takes less than 2 seconds.  Neurological:     Mental Status: He is alert and oriented to person, place, and time.  Psychiatric:  Mood and Affect: Mood normal.    Cranial nerves II through XII grossly intact.  No pronator drift.  No finger dysmetria.  Symmetric 5/5 strength of all extremities.  Sensation intact to light touch in all extremities.  Unremarkable unassisted gait.  Visual fields appear grossly intact bilaterally.  ____________________________________________   LABS (all labs ordered are listed, but only abnormal results are displayed)  Labs Reviewed  COMPREHENSIVE METABOLIC PANEL - Abnormal; Notable for the following components:      Result Value   Glucose, Bld 107 (*)    Calcium 8.6 (*)    All other components within normal limits  ETHANOL - Abnormal; Notable for the following components:   Alcohol, Ethyl (B) 64 (*)    All other components within normal limits  CBC - Abnormal; Notable for the following components:   WBC 11.0 (*)    All other components within normal limits  LIPID PANEL - Abnormal; Notable for the following components:   Triglycerides 237 (*)    VLDL 47 (*)    All other components within normal limits  RESP PANEL BY RT-PCR (FLU A&B, COVID) ARPGX2  URINE DRUG SCREEN, QUALITATIVE ([facility] ONLY)  HEMOGLOBIN A1C   ____________________________________________  EKG  Sinus rhythm with ventricular rate of 50, normal axis, unremarkable intervals without clearance of acute ischemia. ____________________________________________  RADIOLOGY  ED MD interpretation: Chest x-ray shows no focal consolidation, large effusion, significant IMA, pneumothorax or any other clear acute intrathoracic process.  CT head remarkable for new low-density in the left posterior parietal occipital lobes consistent with subacute CVA.  No evidence of other clear acute intracranial process.  Official radiology report(s): DG Chest 2 View  Result Date:  01/17/2021 CLINICAL DATA:  Cough. EXAM: CHEST - 2 VIEW COMPARISON:  None. FINDINGS: The heart size and mediastinal contours are within normal limits. Both lungs are clear. The visualized skeletal structures are unremarkable. IMPRESSION: No active cardiopulmonary disease. Electronically Signed   By: Lupita Raider M.D.   On: 01/17/2021 09:09   CT HEAD WO CONTRAST ( )  Result Date: 01/17/2021 CLINICAL DATA:  Migraine headache. EXAM: CT HEAD WITHOUT CONTRAST TECHNIQUE: Contiguous axial images were obtained from the base of the skull through the vertex without intravenous contrast. COMPARISON:  March 19, 2018. FINDINGS: Brain: New low densities are seen involving the left posterior parietal cortex as well as left occipital lobe concerning for acute infarction. No mass effect or midline shift is noted. Ventricular size is within normal limits. Old lacunar infarction is noted in left basal ganglia. Vascular: No hyperdense vessel or unexpected calcification. Skull: Normal. Negative for fracture or focal lesion. Sinuses/Orbits: No acute finding. Other: None. IMPRESSION: New low densities are noted in the left posterior parietal and occipital lobes most consistent with acute infarctions. MRI is recommended for further evaluation. Electronically Signed   By: Lupita Raider M.D.   On: 01/17/2021 09:13    ____________________________________________   PROCEDURES  Procedure(s) performed (including Critical Care):  .1-3 Lead EKG Interpretation  Date/Time: 01/17/2021 10:17 AM Performed by: Gilles Chiquito, MD Authorized by: Gilles Chiquito, MD     Interpretation: non-specific     ECG rate assessment: bradycardic     Rhythm: sinus rhythm     Ectopy: none     Conduction: normal     ____________________________________________   INITIAL IMPRESSION / ASSESSMENT AND PLAN / ED COURSE      Patient presents with above to history exam for assessment of headache and cough and vomiting  only about the last  week in the setting of ongoing cocaine abuse Daily tobacco and drinking alcohol daily.  However patient insists this is not more than 1 or 2 beers a day.  On arrival he is afebrile and hemodynamically stable.  He has a nonfocal neuro exam and denies any recent trauma or injuries.  With regard of his headache differential includes possible migraine, metabolic derangements and dehydration.  He has no history of recent injuries or falls patient has no obvious evidence of trauma on exam.  However given history of polysubstance abuse will obtain CT head to rule out possible occult injury.  There is no evidence on exam of deep space infection of the head or neck.  Patient is not consistent with acute angle-closure glaucoma or temporal arteritis.  There is no obvious focal deficits or evidence of visual field defect on exam possible CVA is also in the differential.  CT head remarkable for new low-density in the left posterior parietal occipital lobes consistent with subacute CVA.  No evidence of other clear acute intracranial process.  With cardiac cough suspect bronchitis.  No significant wheezing hypoxia or tachypnea to suggest acute COPD exacerbation or other obstructive airway disease at this time.  He has no hypoxia tachypnea or tachycardia or chest pain to suggest PE.  He does not appear volume overloaded and has no history of heart failure.  Suspect likely viral bronchitis.  Chest x-ray is unremarkable.  Given findings concerning for subacute CVA we will order MR studies and give patient ASA.  I will also give him a dose of his Lipitor and plan admit to hospital service for further evaluation and management.   ____________________________________________   FINAL CLINICAL IMPRESSION(S) / ED DIAGNOSES  Final diagnoses:  Nonintractable headache, unspecified chronicity pattern, unspecified headache type  Cough  Tobacco abuse  Cocaine abuse (HCC)  Bronchitis  Cerebrovascular accident (CVA),  unspecified mechanism (HCC)    Medications  magnesium sulfate IVPB 2 g 50 mL (2 g Intravenous New Bag/Given 01/17/21 0938)  atorvastatin (LIPITOR) tablet 40 mg (40 mg Oral Given 01/17/21 0941)  lactated ringers bolus 1,000 mL (1,000 mLs Intravenous New Bag/Given 01/17/21 0936)  prochlorperazine (COMPAZINE) injection 10 mg (10 mg Intravenous Given 01/17/21 0931)  diphenhydrAMINE (BENADRYL) capsule 25 mg (25 mg Oral Given 01/17/21 0930)  aspirin chewable tablet 324 mg (324 mg Oral Given 01/17/21 0940)     ED Discharge Orders          Ordered    atorvastatin (LIPITOR) 40 MG tablet  Daily-1800        01/17/21 0843    clopidogrel (PLAVIX) 75 MG tablet  Daily        01/17/21 0843    aspirin 81 MG chewable tablet  Daily        01/17/21 0843             Note:  This document was prepared using Dragon voice recognition software and may include unintentional dictation errors.    Gilles Chiquito, MD 01/17/21 1017

## 2021-01-17 NOTE — H&P (Signed)
History and Physical    Clinton Knight IRC:[id number] DOB: 1960-10-20 DOA: 01/17/2021  PCP: Pcp, No   Patient coming from: Homeless  I have personally briefly reviewed patient's old medical records in [facility]  Chief Complaint: Headache  HPI: Clinton Knight is a 60 y.o. male with medical history significant for hypertension noncompliant with prescribed medications, history of migraine headaches, history of prior CVA with no focal deficits who presents to the ER via EMS for evaluation of a 6-day history of left-sided headache, mostly over the left temporal/parietal area and rated an 8 x 10 in intensity at its worst.  He states that this headache was different from his usual migraines and was persistent.  He denied having any nausea or light sensitivity with this headache.  Denies any recent trauma.  At the time of this history and physical exam headache has resolved.  He denies having any blurred vision but states that he has had red and yellow spots in both visual fields. He denies having any focal deficits, no weakness, no difficulty swallowing, no slurred speech, no chest pain, no shortness of breath, no nausea, no vomiting, no diaphoresis, no palpitations swelling, no dizziness, no lightheadedness Labs show sodium 140, potassium 3.9, chloride 110, bicarb 25, glucose 107, BUN 13, creatinine 0.91, calcium 8.6, alkaline phosphatase 93, albumin 3.6, AST 22, ALT 20, total protein 6.9, total cholesterol 188, HDL 45, LDL 96, white count 11.0, hemoglobin 13.1, hematocrit 40.8, MCV 89.7, RDW 14.2, platelet count 256, Tylenol level less than 10, salicylate level less than 7 Respiratory viral panel is negative Chest x-ray reviewed by me shows no evidence of acute cardiopulmonary disease CT scan of the head without contrast shows new low densities noted in the left posterior parietal and occipital lobes infarctions. Twelve-lead EKG reviewed by me shows sinus rhythm with right axis deviation.    ED  Course: Patient is a 59 year old African-American male with a history significant for nicotine dependence, history of prior CVA with no focal deficit, hypertension noncompliant with medication who presents to the ER for evaluation of a 6-day history of left-sided headache associated with spots in both visual fields. CT scan of the head without contrast shows new low densities in the left posterior parietal and occipital lobe infarctions. Patient will be admitted to the hospital for further evaluation.   Review of Systems: As per HPI otherwise all other systems reviewed and negative.    Past Medical History:  Diagnosis Date   Acute kidney insufficiency    03/2018: Cr 1.1 in setting of stroke (baseline ~0.9)   Alcohol abuse    Cocaine abuse (HCC)    03/2018: Postitive cocaine UA tox screen   Diabetes (HCC)    03/20/18: A1C 6.4   Hypertension    Hypertriglyceridemia    03/20/18 TG 511   Nicotine abuse    Stroke [facility])     Past Surgical History:  Procedure Laterality Date   LEFT HEART CATH AND CORONARY ANGIOGRAPHY N/A 05/06/2018   Procedure: LEFT HEART CATH AND CORONARY ANGIOGRAPHY;  Surgeon: Iran Ouch, MD;  Location: [facility] INVASIVE CV LAB;  Service: Cardiovascular;  Laterality: N/A;   NO PAST SURGERIES       reports that he has been smoking cigarettes. He has been smoking an average of .25 packs per day. He has never used smokeless tobacco. He reports current alcohol use. He reports current drug use. Drug: Cocaine.  No Known Allergies  Family History  Problem Relation Age of Onset  Diabetes Mother    Stroke Mother    Hypertension Mother    Heart attack Brother 41       Brother, MI, 92 yo   Thyroid disease Daughter        Removed her entire thyroid      Prior to Admission medications   Medication Sig Start Date End Date Taking? Authorizing Provider  aspirin 81 MG chewable tablet Chew 1 tablet (81 mg total) by mouth once daily. 01/17/21   Gilles Chiquito, MD   atorvastatin (LIPITOR) 40 MG tablet Take 1 tablet (40 mg total) by mouth once daily at 6 PM. 01/17/21   Gilles Chiquito, MD  clopidogrel (PLAVIX) 75 MG tablet Take 1 tablet (75 mg total) by mouth daily. 01/17/21   Gilles Chiquito, MD  lisinopril (PRINIVIL,ZESTRIL) 20 MG tablet Take 1 tablet (20 mg total) by mouth daily. 07/11/18   Antonieta Iba, MD  naproxen (NAPROSYN) 500 MG tablet Take 1 tablet (500 mg total) by mouth 2 (two) times daily with a meal. 05/16/20   Tommi Rumps, PA-C    Physical Exam: Vitals:   01/17/21 0456 01/17/21 0930  BP: 116/66 (!) 150/116  Pulse: 71   Resp: 18 (!) 23  Temp: 98.6 F (37 C)   TempSrc: Oral   SpO2: 100%      Vitals:   01/17/21 0456 01/17/21 0930  BP: 116/66 (!) 150/116  Pulse: 71   Resp: 18 (!) 23  Temp: 98.6 F (37 C)   TempSrc: Oral   SpO2: 100%       Constitutional: Alert and oriented x 3 . Not in any apparent distress HEENT:      Head: Normocephalic and atraumatic.         Eyes: PERLA, EOMI, Conjunctivae are normal. Sclera is non-icteric.       Mouth/Throat: Mucous membranes are moist.       Neck: Supple with no signs of meningismus. Cardiovascular: Regular rate and rhythm. No murmurs, gallops, or rubs. 2+ symmetrical distal pulses are present . No JVD. No LE edema Respiratory: Respiratory effort normal .Lungs sounds clear bilaterally. No wheezes, crackles, or rhonchi.  Gastrointestinal: Soft, non tender, and non distended with positive bowel sounds.  Genitourinary: No CVA tenderness. Musculoskeletal: Nontender with normal range of motion in all extremities. No cyanosis, or erythema of extremities. Neurologic:  Face is symmetric. Moving all extremities. No gross focal neurologic deficits.  Able to move all extremities, no focal deficits Skin: Skin is warm, dry.  No rash or ulcers Psychiatric: Mood and affect are normal.   Labs on Admission: I have personally reviewed following labs and imaging studies  CBC: Recent Labs   Lab 01/17/21 0504  WBC 11.0*  HGB 13.1  HCT 40.8  MCV 89.7  PLT 256   Basic Metabolic Panel: Recent Labs  Lab 01/17/21 0504  NA 140  K 3.9  CL 110  CO2 25  GLUCOSE 107*  BUN 13  CREATININE 0.91  CALCIUM 8.6*   GFR: CrCl cannot be calculated (Unknown ideal weight.). Liver Function Tests: Recent Labs  Lab 01/17/21 0504  AST 22  ALT 20  ALKPHOS 93  BILITOT 0.5  PROT 6.9  ALBUMIN 3.6   No results for input(s): LIPASE, AMYLASE in the last 168 hours. No results for input(s): AMMONIA in the last 168 hours. Coagulation Profile: No results for input(s): INR, PROTIME in the last 168 hours. Cardiac Enzymes: No results for input(s): CKTOTAL, CKMB, CKMBINDEX, TROPONINI in the  last 168 hours. BNP (last 3 results) No results for input(s): PROBNP in the last 8760 hours. HbA1C: No results for input(s): HGBA1C in the last 72 hours. CBG: No results for input(s): GLUCAP in the last 168 hours. Lipid Profile: Recent Labs    01/17/21 0504  CHOL 188  HDL 45  LDLCALC 96  TRIG 237*  CHOLHDL 4.2   Thyroid Function Tests: No results for input(s): TSH, T4TOTAL, FREET4, T3FREE, THYROIDAB in the last 72 hours. Anemia Panel: No results for input(s): VITAMINB12, FOLATE, FERRITIN, TIBC, IRON, RETICCTPCT in the last 72 hours. Urine analysis:    Component Value Date/Time   COLORURINE YELLOW 03/28/2018 1433   APPEARANCEUR CLEAR 03/28/2018 1433   APPEARANCEUR Clear 01/29/2014 0536   LABSPEC 1.009 03/28/2018 1433   LABSPEC 1.010 01/29/2014 0536   PHURINE 8.0 03/28/2018 1433   GLUCOSEU NEGATIVE 03/28/2018 1433   GLUCOSEU Negative 01/29/2014 0536   HGBUR NEGATIVE 03/28/2018 1433   BILIRUBINUR NEGATIVE 03/28/2018 1433   BILIRUBINUR Negative 01/29/2014 0536   KETONESUR NEGATIVE 03/28/2018 1433   PROTEINUR NEGATIVE 03/28/2018 1433   NITRITE NEGATIVE 03/28/2018 1433   LEUKOCYTESUR NEGATIVE 03/28/2018 1433   LEUKOCYTESUR Negative 01/29/2014 0536    Radiological Exams on  Admission: DG Chest 2 View  Result Date: 01/17/2021 CLINICAL DATA:  Cough. EXAM: CHEST - 2 VIEW COMPARISON:  None. FINDINGS: The heart size and mediastinal contours are within normal limits. Both lungs are clear. The visualized skeletal structures are unremarkable. IMPRESSION: No active cardiopulmonary disease. Electronically Signed   By: Lupita RaiderJames  Green Jr M.D.   On: 01/17/2021 09:09   CT HEAD WO CONTRAST (5MM)  Result Date: 01/17/2021 CLINICAL DATA:  Migraine headache. EXAM: CT HEAD WITHOUT CONTRAST TECHNIQUE: Contiguous axial images were obtained from the base of the skull through the vertex without intravenous contrast. COMPARISON:  March 19, 2018. FINDINGS: Brain: New low densities are seen involving the left posterior parietal cortex as well as left occipital lobe concerning for acute infarction. No mass effect or midline shift is noted. Ventricular size is within normal limits. Old lacunar infarction is noted in left basal ganglia. Vascular: No hyperdense vessel or unexpected calcification. Skull: Normal. Negative for fracture or focal lesion. Sinuses/Orbits: No acute finding. Other: None. IMPRESSION: New low densities are noted in the left posterior parietal and occipital lobes most consistent with acute infarctions. MRI is recommended for further evaluation. Electronically Signed   By: Lupita RaiderJames  Green Jr M.D.   On: 01/17/2021 09:13     Assessment/Plan Principal Problem:   Acute CVA (cerebrovascular accident) [facility](HCC) Active Problems:   Right pontine stroke (HCC)   Diabetes mellitus type 2 in obese [facility](HCC)   Essential hypertension   Nicotine dependence   Non compliance w medication regimen   Polysubstance abuse (HCC)     Acute CVA Patient presents for evaluation of persistent left-sided headache with spots in his visual fields and had a CT scan of the head done without contrast which shows new low densities in the left posterior parietal and occipital lobes consistent with acute infarction. Will  obtain MRI of the brain for further evaluation Obtain 2D echocardiogram to assess LVEF and rule out cardiac thrombus Allow for permissive hypertension Continue dual antiplatelet therapy and high intensity statin  We will request PT/OT/ST consult We will consult neurology     Noncompliance with medication regimen Patient has been noncompliant with his medications and states that he has been out of his medicines for about a month I have explained to him in  detail the need to be compliant with prescribed medications We will request TOC consult     Polysubstance abuse Nicotine/alcohol/cocaine Smoking cessation has been discussed with patient in detail and he has been encouraged to abstain from further alcohol and cocaine use Will place patient on CIWA protocol and administer lorazepam for CIWA score of 8 or greater Place patient on a nicotine transdermal patch 21 mg daily     Diabetes mellitus Check blood sugars with meals Obtain hemoglobin A1c level    DVT prophylaxis: SCD  Code Status: full code  Family Communication: Greater than 50% of time was spent discussing patient's condition and plan of care with him at the bedside.  All questions and concerns have been addressed.  He verbalizes understanding and agrees to the plan. Disposition Plan: Back to previous home environment Consults called: Neurology Status: Observation    Rotha Cassels MD [facility]     01/17/2021, 11:09 AM

## 2021-01-17 NOTE — Progress Notes (Signed)
SLP Cancellation Note  Patient Details Name: Clinton Knight MRN: [id number] DOB: 15-Dec-1960   Cancelled treatment:       Reason Eval/Treat Not Completed: Patient at procedure or test/unavailable  ST order received and chart reviewed. Pt currently off the unit for MRI. ST will re-attempt when pt is next available.   Makila Colombe B. Dreama Saa M.S., CCC-SLP, [facility] Speech-Language Pathologist Rehabilitation Services Office [phone]  Leahmarie Gasiorowski Dreama Saa 01/17/2021, 11:41 AM

## 2021-01-17 NOTE — Evaluation (Signed)
Physical Therapy Evaluation Patient Details Name: Clinton Knight MRN: [id number] DOB: 1961-04-13 Today's Date: 01/17/2021   History of Present Illness  Per MD notes, pt is a 60 y.o. male who presented to the ER via EMS for evaluation of a 6-day history of left-sided headache, mostly over the left temporal/parietal area. PMH includes hypertension noncompliant with prescribed medications, history of migraine headaches, and history of prior CVA with no focal deficits. MD assessment includes acute CVA, noncompliance with medication regimen, and polysubstance abuse.  Clinical Impression  Pt was willing to participate during the session. Pt was very lethargic upon arrival and become slightly more alert when sitting on EOB, but still overall lethargic throughout almost falling asleep mid activities/conversation. Pt able to perform all functional mobility with supervision- min guard requiring frequent verbal cues for sequencing secondary to lethargy. Pt's ambulation and OOB activities were limited by lethargy and will perform further OOB activity in future visits as appropriate. Pt's strength was symmetrical bilaterally for UEs and LEs. Further assessment of sensation and coordination will be performed next session as appropriate and if he is more alert. Pt will benefit from HHPT upon discharge to safely address deficits listed in patient problem list for decreased caregiver assistance and eventual return to PLOF.     Follow Up Recommendations Home health PT;Supervision for mobility/OOB    Equipment Recommendations  Other (comment) (TBD at future session)    Recommendations for Other Services       Precautions / Restrictions Precautions Precautions: Fall Restrictions Weight Bearing Restrictions: No      Mobility  Bed Mobility Overal bed mobility: Needs Assistance Bed Mobility: Supine to Sit;Sit to Supine     Supine to sit: Supervision Sit to supine: Supervision   General bed mobility  comments: Verbal cues for sequencing.    Transfers Overall transfer level: Needs assistance Equipment used: Rolling walker (2 wheeled) Transfers: Sit to/from Stand Sit to Stand: Min guard         General transfer comment: Mod verbal cues for sequencing and hand placement  Ambulation/Gait Ambulation/Gait assistance: Min guard Gait Distance (Feet): 10 Feet Assistive device: Rolling walker (2 wheeled) Gait Pattern/deviations: Step-through pattern;Decreased stride length;Leaning posteriorly Gait velocity: decreased   General Gait Details: Pt required mod verbal cues for sequencing and walker placement. Pt walked forwards/backwards within room and took sidesteps towards [facility]. He demonstrated 1 instance of leaning posteriorly walking backwards but able to correct after verbal cue.  Stairs            Wheelchair Mobility    Modified Rankin (Stroke Patients Only)       Balance Overall balance assessment: Needs assistance Sitting-balance support: Feet supported;No upper extremity supported Sitting balance-Leahy Scale: Fair Sitting balance - Comments: Demonstrated intermittent leaning posteriorly and laterally secondary to lethargy but able to correct after verbal cueing.   Standing balance support: Bilateral upper extremity supported Standing balance-Leahy Scale: Fair Standing balance comment: Reliance on UE support through RW.                             Pertinent Vitals/Pain Pain Assessment: No/denies pain Faces Pain Scale: Hurts a little bit Pain Location: headache Pain Descriptors / Indicators: Headache Pain Intervention(s): Repositioned    Home Living Family/patient expects to be discharged to:: Private residence Living Arrangements: Other relatives;Other (Comment) (sister) Available Help at Discharge: Family;Available PRN/intermittently (Sister works during day) Type of Home: House Home Access: Stairs to enter Entrance Stairs-Rails: None  Entrance  Stairs-Number of Steps: 1-2 Home Layout: One level Home Equipment: Cane - single point      Prior Function Level of Independence: Independent with assistive device(s)         Comments: Mod I using SPC for ambulation and ADLs. Pt reports that he doesn't work. Pt was limited providing above information secondary to lethargy falling asleep mid-conversation.     Hand Dominance   Dominant Hand: Right    Extremity/Trunk Assessment   Upper Extremity Assessment Upper Extremity Assessment: RUE deficits/detail;LUE deficits/detail RUE Deficits / Details: Strength grossly 4+/5; Difficulty following instructions secondary to lethargy so coordination and sensation deferred to next visit as appropriate RUE Coordination:  (Difficulty following instructions secondary to lethargy so deferred to next visit as appropriate) LUE Deficits / Details: Strength grossly 4+/5    Lower Extremity Assessment Lower Extremity Assessment: RLE deficits/detail;LLE deficits/detail RLE Deficits / Details: Strength grossly at 5/5 LLE Deficits / Details: Strength grossly at 5/5    Cervical / Trunk Assessment Cervical / Trunk Assessment: Normal  Communication   Communication: No difficulties (Very lethargic)  Cognition Arousal/Alertness: Lethargic Behavior During Therapy: Flat affect Overall Cognitive Status: No family/caregiver present to determine baseline cognitive functioning                                 General Comments: answers questions appropriately with increased time/reprtition likelt r/t lethargy (eyes closed intermittetnly t/o session)      General Comments      Exercises Total Joint Exercises Long Arc Quad: AROM;Strengthening;Both;10 reps;Seated Marching in Standing: AROM;Strengthening;Both;10 reps;Standing Other Exercises Other Exercises: Static sitting 3-95min with supervision for improved trunk control and balance. Other Exercises: Static standing balance 1-2 min with min  guard for improved activity tolerance.   Assessment/Plan    PT Assessment Patient needs continued PT services  PT Problem List Decreased strength;Decreased activity tolerance;Decreased balance;Decreased mobility;Decreased knowledge of use of DME;Decreased safety awareness       PT Treatment Interventions DME instruction;Gait training;Stair training;Functional mobility training;Therapeutic activities;Therapeutic exercise;Balance training;Patient/family education    PT Goals (Current goals can be found in the Care Plan section)  Acute Rehab PT Goals Patient Stated Goal: to feel better PT Goal Formulation: Patient unable to participate in goal setting Time For Goal Achievement: 01/30/21 Potential to Achieve Goals: Fair    Frequency 7X/week   Barriers to discharge        Co-evaluation               AM-PAC PT "6 Clicks" Mobility  Outcome Measure Help needed turning from your back to your side while in a flat bed without using bedrails?: None Help needed moving from lying on your back to sitting on the side of a flat bed without using bedrails?: A Little Help needed moving to and from a bed to a chair (including a wheelchair)?: A Little Help needed standing up from a chair using your arms (e.g., wheelchair or bedside chair)?: A Little Help needed to walk in hospital room?: A Little Help needed climbing 3-5 steps with a railing? : A Lot 6 Click Score: 18    End of Session Equipment Utilized During Treatment: Gait belt Activity Tolerance: Patient limited by lethargy Patient left: in bed;with call bell/phone within reach (with OT in the room) Nurse Communication: Mobility status PT Visit Diagnosis: Unsteadiness on feet (R26.81);Muscle weakness (generalized) (M62.81);Difficulty in walking, not elsewhere classified (R26.2)    Time: 7371-0626 PT  Time Calculation (min) (ACUTE ONLY): 24 min   Charges:              Desiree Hane SPT 01/17/21, 4:30 PM

## 2021-01-17 NOTE — ED Notes (Signed)
Pt provided lunch tray.

## 2021-01-17 NOTE — ED Notes (Signed)
Pt provided phone to screen for MRI 

## 2021-01-17 NOTE — Consult Note (Signed)
NEURO HOSPITALIST CONSULT NOTE   Requestig physician: Dr. Joylene IgoAgbata  Reason for Consult: Acute bilateral PCA territory strokes on MRI   History obtained from:  Patient and Chart     HPI:                                                                                                                                          Clinton Knight is an 60 y.o. male with a PMHx of TIA, DM, HTN, HLD, CAD, alcohol abuse, cocaine abuse and smoking who presented to the ED early this morning with a chief complaint of a generalized throbbing headache and wanting help with alcohol use. He described his head pain as a "migraine headache for about a week". He also endorsed intermittent "colorful spots" in his vision. He denied visual blurring on interview with EDP, but stated that he has been having some trouble seeing since last Wednesday when interviewed by Neurology. He endorsed that the vision problems first occurred at the time he also started having the headache. At its worst, he rates the headache as 6/10. BP on arrival was 125/90 with HR 89.   He endorses last using cocaine on Sunday. His last beer was at about 2300 on Sunday. He stated that he recently became homeless and that he had been off of his BP meds for about 1 month.    Past Medical History:  Diagnosis Date   Acute kidney insufficiency    03/2018: Cr 1.1 in setting of stroke (baseline ~0.9)   Alcohol abuse    Cocaine abuse (HCC)    03/2018: Postitive cocaine UA tox screen   Diabetes (HCC)    03/20/18: A1C 6.4   Hypertension    Hypertriglyceridemia    03/20/18 TG 511   Nicotine abuse    Stroke [facility](HCC)     Past Surgical History:  Procedure Laterality Date   LEFT HEART CATH AND CORONARY ANGIOGRAPHY N/A 05/06/2018   Procedure: LEFT HEART CATH AND CORONARY ANGIOGRAPHY;  Surgeon: Iran OuchArida, Muhammad A, MD;  Location: [facility] INVASIVE CV LAB;  Service: Cardiovascular;  Laterality: N/A;   NO PAST SURGERIES      Family History   Problem Relation Age of Onset   Diabetes Mother    Stroke Mother    Hypertension Mother    Heart attack Brother [id number]       Brother, MI, 60 yo   Thyroid disease Daughter        Removed her entire thyroid              Social History:  reports that he has been smoking cigarettes. He has been smoking an average of .25 packs per day. He has never used smokeless tobacco. He reports current alcohol use. He reports current drug use. Drug:  Cocaine.  No Known Allergies  MEDICATIONS:                                                                                                                      No current facility-administered medications on file prior to encounter.   Current Outpatient Medications on File Prior to Encounter  Medication Sig Dispense Refill   lisinopril (PRINIVIL,ZESTRIL) 20 MG tablet Take 1 tablet (20 mg total) by mouth daily. (Patient not taking: Reported on 01/17/2021) 30 tablet 6   naproxen (NAPROSYN) 500 MG tablet Take 1 tablet (500 mg total) by mouth 2 (two) times daily with a meal. (Patient not taking: Reported on 01/17/2021) 30 tablet 0  The patient states that he has not been taking his meds due to having run out of them.    ROS:                                                                                                                                       The patient states he previously had a 6/10 hieadache in the context of his drowsiness, he denies any current symptoms. He did endorse a cough when he arrived in the ED.    Blood pressure (!) 149/78, pulse (!) 55, temperature 98 F (36.7 C), temperature source Oral, resp. rate 16, SpO2 98 %.   General Examination:                                                                                                       Physical Exam  HEENT-  [place]/AT   Lungs- Respirations unlabored Extremities- No edema  Neurological Examination Mental Status: Somnolent. Falls asleep during exam, requiring frequent tactile and  verbal stimulation to awaken back to a drowsy state. Speech is nondysarthric. In the context of relatively short replies to answers, the patient's speech is fluent. Able to follow all commands while awake. Naming intact. Oriented to the month, the year, city and state but could not correctly recall  the day of the week.  Cranial Nerves: II: Due to somnolence the patient could not give reliable responses when testing visual fields, but there may have been a trend towards decreased responsiveness to visual stimuli in his right hemifields. There was no extinction to DSS when testing right and left hemifields simultaneously. PERRL.  III,IV, VI: No ptosis. Intermittent exotropia noted. Able to track fully to the left and right with conjugate EOM.  V,VII: Smile symmetric, facial temp sensation equal bilaterally VIII: Hearing intact to voice IX,X: No hypophonia XI: Symmetric XII: Midline tongue extension Motor: Right : Upper extremity   5/5    Left:     Upper extremity   5/5  Lower extremity   5/5     Lower extremity   5/5 No pronator drift.  Muscle bulk is normal.  Sensory: Temp and FT intact x 4. No extinction to DSS.  Deep Tendon Reflexes: 1-2+ and symmetric throughout Plantars: Right: downgoing   Left: downgoing Cerebellar: No ataxia with FNF bilaterally  Gait: Deferred   Lab Results: Basic Metabolic Panel: Recent Labs  Lab 01/17/21 0504  NA 140  K 3.9  CL 110  CO2 25  GLUCOSE 107*  BUN 13  CREATININE 0.91  CALCIUM 8.6*    CBC: Recent Labs  Lab 01/17/21 0504  WBC 11.0*  HGB 13.1  HCT 40.8  MCV 89.7  PLT 256    Cardiac Enzymes: No results for input(s): CKTOTAL, CKMB, CKMBINDEX, TROPONINI in the last 168 hours.  Lipid Panel: Recent Labs  Lab 01/17/21 0504  CHOL 188  TRIG 237*  HDL 45  CHOLHDL 4.2  VLDL 47*  LDLCALC 96    Imaging: DG Chest 2 View  Result Date: 01/17/2021 CLINICAL DATA:  Cough. EXAM: CHEST - 2 VIEW COMPARISON:  None. FINDINGS: The heart size and  mediastinal contours are within normal limits. Both lungs are clear. The visualized skeletal structures are unremarkable. IMPRESSION: No active cardiopulmonary disease. Electronically Signed   By: Lupita Raider M.D.   On: 01/17/2021 09:09   CT HEAD WO CONTRAST ( )  Result Date: 01/17/2021 CLINICAL DATA:  Migraine headache. EXAM: CT HEAD WITHOUT CONTRAST TECHNIQUE: Contiguous axial images were obtained from the base of the skull through the vertex without intravenous contrast. COMPARISON:  March 19, 2018. FINDINGS: Brain: New low densities are seen involving the left posterior parietal cortex as well as left occipital lobe concerning for acute infarction. No mass effect or midline shift is noted. Ventricular size is within normal limits. Old lacunar infarction is noted in left basal ganglia. Vascular: No hyperdense vessel or unexpected calcification. Skull: Normal. Negative for fracture or focal lesion. Sinuses/Orbits: No acute finding. Other: None. IMPRESSION: New low densities are noted in the left posterior parietal and occipital lobes most consistent with acute infarctions. MRI is recommended for further evaluation. Electronically Signed   By: Lupita Raider M.D.   On: 01/17/2021 09:13   MR ANGIO HEAD WO CONTRAST  Result Date: 01/17/2021 CLINICAL DATA:  Neuro deficit, acute, stroke suspected EXAM: MRI HEAD WITHOUT CONTRAST MRA HEAD WITHOUT CONTRAST MRA NECK WITHOUT CONTRAST TECHNIQUE: Multiplanar, multiecho pulse sequences of the brain and surrounding structures were obtained without intravenous contrast. Angiographic images of the Circle of Willis were obtained using MRA technique without intravenous contrast. Angiographic images of the neck were obtained using MRA technique without intravenous contrast. Carotid stenosis measurements (when applicable) are obtained utilizing NASCET criteria, using the distal internal carotid diameter as the denominator. COMPARISON:  None. FINDINGS:  MRI HEAD FINDINGS  Brain: Acute bilateral occipital lobe (larger on the left ) and left posterior temporal and parietal lobe infarcts. Associated edema with mild regional mass effect. No midline shift. Remote lacunar infarcts in bilateral basal ganglia with suspected prior hemorrhage in the left basal ganglia given associated susceptibility artifact. Additional remote infarcts in bilateral cerebellar hemispheres, larger on the left. Otherwise, mild scattered T2 hyperintensities in the white matter, likely related to chronic microvascular ischemic disease. No evidence of acute hemorrhage. No mass lesion or extra-axial fluid collection. No hydrocephalus. Vascular: See below. Skull and upper cervical spine: Normal marrow signal. Sinuses/Orbits: Mild ethmoid air cell mucosal thickening. Unremarkable orbits. Other: No mastoid effusions. MRA HEAD FINDINGS Motion limited evaluation. Anterior circulation: Bilateral intracranial ICAs, MCAs, and ACAs are patent. Motion limited evaluation without evidence of a high-grade proximal stenosis. Suspected severe stenosis of the distal left ACA (A3/A4). Posterior circulation: Severe stenosis versus occlusion of the distal right intradural vertebral artery. Left intradural vertebral artery is patent proximally with atherosclerotic narrowing. Severe stenosis at the left vertebrobasilar junction. Moderate to severe stenosis and irregularity of the proximal basilar artery. Bilateral PCAs are patent. Severe stenosis of the right P2 PCA. Multifocal mild-to-moderate stenosis of the left P2 PCA. No aneurysm identified. MRA NECK FINDINGS Severely limited study. Nondiagnostic evaluation lower neck with nonvisualization of the arteries. There appears to be antegrade flow in the common carotid and internal carotid arteries and the left vertebral artery in the mid upper neck. Likely left dominant vertebral artery. Intermittent poor flow related signal in the right vertebral artery, suggesting potential  hemodynamically significant stenosis in the neck. IMPRESSION: MRI: 1. Acute bilateral occipital lobe (larger on the left ) and left posterior temporal and parietal lobe infarcts (PCA territories). Associated edema with mild regional mass effect. No midline shift. 2. Remote infarcts in bilateral cerebellar hemispheres and bilateral basal ganglia with suspected prior hemorrhage in the left basal ganglia. MRA Head: 1. Severe stenosis versus occlusion of the distal right intradural vertebral artery. 2. Severe stenosis at the left vertebrobasilar junction with moderate to severe stenosis and irregularity of the proximal basilar artery. 3. Severe right P2 PCA stenosis. Multifocal mild-to-moderate stenosis of the left P2 PCA. 4. Suspected severe stenosis of the distal left ACA (A3/A4). 5. Right PICA is patent proximally with poor flow related signal distally. MRA Neck: Severely limited study with nonvisualization of the arteries in the lower neck. Intermittent poor flow related signal of the visualized right vertebral artery, suggesting potential hemodynamically significant stenosis. Antegrade flow in the visualized carotid arteries and left vertebral artery in the mid to upper neck with nondiagnostic evaluation for stenosis. A CTA of the neck could better evaluate if clinically indicated. Electronically Signed   By: Feliberto Harts MD   On: 01/17/2021 12:42   MR ANGIO NECK WO CONTRAST  Result Date: 01/17/2021 CLINICAL DATA:  Neuro deficit, acute, stroke suspected EXAM: MRI HEAD WITHOUT CONTRAST MRA HEAD WITHOUT CONTRAST MRA NECK WITHOUT CONTRAST TECHNIQUE: Multiplanar, multiecho pulse sequences of the brain and surrounding structures were obtained without intravenous contrast. Angiographic images of the Circle of Willis were obtained using MRA technique without intravenous contrast. Angiographic images of the neck were obtained using MRA technique without intravenous contrast. Carotid stenosis measurements (when  applicable) are obtained utilizing NASCET criteria, using the distal internal carotid diameter as the denominator. COMPARISON:  None. FINDINGS: MRI HEAD FINDINGS Brain: Acute bilateral occipital lobe (larger on the left ) and left posterior temporal and parietal lobe infarcts. Associated edema  with mild regional mass effect. No midline shift. Remote lacunar infarcts in bilateral basal ganglia with suspected prior hemorrhage in the left basal ganglia given associated susceptibility artifact. Additional remote infarcts in bilateral cerebellar hemispheres, larger on the left. Otherwise, mild scattered T2 hyperintensities in the white matter, likely related to chronic microvascular ischemic disease. No evidence of acute hemorrhage. No mass lesion or extra-axial fluid collection. No hydrocephalus. Vascular: See below. Skull and upper cervical spine: Normal marrow signal. Sinuses/Orbits: Mild ethmoid air cell mucosal thickening. Unremarkable orbits. Other: No mastoid effusions. MRA HEAD FINDINGS Motion limited evaluation. Anterior circulation: Bilateral intracranial ICAs, MCAs, and ACAs are patent. Motion limited evaluation without evidence of a high-grade proximal stenosis. Suspected severe stenosis of the distal left ACA (A3/A4). Posterior circulation: Severe stenosis versus occlusion of the distal right intradural vertebral artery. Left intradural vertebral artery is patent proximally with atherosclerotic narrowing. Severe stenosis at the left vertebrobasilar junction. Moderate to severe stenosis and irregularity of the proximal basilar artery. Bilateral PCAs are patent. Severe stenosis of the right P2 PCA. Multifocal mild-to-moderate stenosis of the left P2 PCA. No aneurysm identified. MRA NECK FINDINGS Severely limited study. Nondiagnostic evaluation lower neck with nonvisualization of the arteries. There appears to be antegrade flow in the common carotid and internal carotid arteries and the left vertebral artery in  the mid upper neck. Likely left dominant vertebral artery. Intermittent poor flow related signal in the right vertebral artery, suggesting potential hemodynamically significant stenosis in the neck. IMPRESSION: MRI: 1. Acute bilateral occipital lobe (larger on the left ) and left posterior temporal and parietal lobe infarcts (PCA territories). Associated edema with mild regional mass effect. No midline shift. 2. Remote infarcts in bilateral cerebellar hemispheres and bilateral basal ganglia with suspected prior hemorrhage in the left basal ganglia. MRA Head: 1. Severe stenosis versus occlusion of the distal right intradural vertebral artery. 2. Severe stenosis at the left vertebrobasilar junction with moderate to severe stenosis and irregularity of the proximal basilar artery. 3. Severe right P2 PCA stenosis. Multifocal mild-to-moderate stenosis of the left P2 PCA. 4. Suspected severe stenosis of the distal left ACA (A3/A4). 5. Right PICA is patent proximally with poor flow related signal distally. MRA Neck: Severely limited study with nonvisualization of the arteries in the lower neck. Intermittent poor flow related signal of the visualized right vertebral artery, suggesting potential hemodynamically significant stenosis. Antegrade flow in the visualized carotid arteries and left vertebral artery in the mid to upper neck with nondiagnostic evaluation for stenosis. A CTA of the neck could better evaluate if clinically indicated. Electronically Signed   By: Feliberto Harts MD   On: 01/17/2021 12:42   MR BRAIN WO CONTRAST  Result Date: 01/17/2021 CLINICAL DATA:  Neuro deficit, acute, stroke suspected EXAM: MRI HEAD WITHOUT CONTRAST MRA HEAD WITHOUT CONTRAST MRA NECK WITHOUT CONTRAST TECHNIQUE: Multiplanar, multiecho pulse sequences of the brain and surrounding structures were obtained without intravenous contrast. Angiographic images of the Circle of Willis were obtained using MRA technique without intravenous  contrast. Angiographic images of the neck were obtained using MRA technique without intravenous contrast. Carotid stenosis measurements (when applicable) are obtained utilizing NASCET criteria, using the distal internal carotid diameter as the denominator. COMPARISON:  None. FINDINGS: MRI HEAD FINDINGS Brain: Acute bilateral occipital lobe (larger on the left ) and left posterior temporal and parietal lobe infarcts. Associated edema with mild regional mass effect. No midline shift. Remote lacunar infarcts in bilateral basal ganglia with suspected prior hemorrhage in the left basal ganglia given  associated susceptibility artifact. Additional remote infarcts in bilateral cerebellar hemispheres, larger on the left. Otherwise, mild scattered T2 hyperintensities in the white matter, likely related to chronic microvascular ischemic disease. No evidence of acute hemorrhage. No mass lesion or extra-axial fluid collection. No hydrocephalus. Vascular: See below. Skull and upper cervical spine: Normal marrow signal. Sinuses/Orbits: Mild ethmoid air cell mucosal thickening. Unremarkable orbits. Other: No mastoid effusions. MRA HEAD FINDINGS Motion limited evaluation. Anterior circulation: Bilateral intracranial ICAs, MCAs, and ACAs are patent. Motion limited evaluation without evidence of a high-grade proximal stenosis. Suspected severe stenosis of the distal left ACA (A3/A4). Posterior circulation: Severe stenosis versus occlusion of the distal right intradural vertebral artery. Left intradural vertebral artery is patent proximally with atherosclerotic narrowing. Severe stenosis at the left vertebrobasilar junction. Moderate to severe stenosis and irregularity of the proximal basilar artery. Bilateral PCAs are patent. Severe stenosis of the right P2 PCA. Multifocal mild-to-moderate stenosis of the left P2 PCA. No aneurysm identified. MRA NECK FINDINGS Severely limited study. Nondiagnostic evaluation lower neck with  nonvisualization of the arteries. There appears to be antegrade flow in the common carotid and internal carotid arteries and the left vertebral artery in the mid upper neck. Likely left dominant vertebral artery. Intermittent poor flow related signal in the right vertebral artery, suggesting potential hemodynamically significant stenosis in the neck. IMPRESSION: MRI: 1. Acute bilateral occipital lobe (larger on the left ) and left posterior temporal and parietal lobe infarcts (PCA territories). Associated edema with mild regional mass effect. No midline shift. 2. Remote infarcts in bilateral cerebellar hemispheres and bilateral basal ganglia with suspected prior hemorrhage in the left basal ganglia. MRA Head: 1. Severe stenosis versus occlusion of the distal right intradural vertebral artery. 2. Severe stenosis at the left vertebrobasilar junction with moderate to severe stenosis and irregularity of the proximal basilar artery. 3. Severe right P2 PCA stenosis. Multifocal mild-to-moderate stenosis of the left P2 PCA. 4. Suspected severe stenosis of the distal left ACA (A3/A4). 5. Right PICA is patent proximally with poor flow related signal distally. MRA Neck: Severely limited study with nonvisualization of the arteries in the lower neck. Intermittent poor flow related signal of the visualized right vertebral artery, suggesting potential hemodynamically significant stenosis. Antegrade flow in the visualized carotid arteries and left vertebral artery in the mid to upper neck with nondiagnostic evaluation for stenosis. A CTA of the neck could better evaluate if clinically indicated. Electronically Signed   By: Feliberto Harts MD   On: 01/17/2021 12:42     Assessment: 60 year old male presenting with bilateral subacute PCA territory infarctions. Severe intracranial atherosclerotic disease noted on MRA.  1. Exam reveals equivocal right field cut and somnolence.  2. MRI brain: Acute bilateral occipital lobe (larger  on the left ) and left posterior temporal and parietal lobe infarcts (PCA territories). Associated edema with mild regional mass effect. No midline shift. Remote infarcts in bilateral cerebellar hemispheres and bilateral basal ganglia with suspected prior hemorrhage in the left basal ganglia. 3. MRA Head: Severe stenosis versus occlusion of the distal right intradural vertebral artery. Severe stenosis at the left vertebrobasilar junction with moderate to severe stenosis and irregularity of the proximal basilar artery. Severe right P2 PCA stenosis. Multifocal mild-to-moderate stenosis of the left P2 PCA. Suspected severe stenosis of the distal left ACA (A3/A4). Right PICA is patent proximally with poor flow related signal distally. 4. MRA Neck: Severely limited study with nonvisualization of the arteries in the lower neck. Intermittent poor flow related signal  of the visualized right vertebral artery, suggesting potential hemodynamically significant stenosis. Antegrade flow in the visualized carotid arteries and left vertebral artery in the mid to upper neck with nondiagnostic evaluation for stenosis.  5. Most likely component of the DDx for the patient's MRI findings is bilateral strokes (atheroembolic versus cardioembolic). PRES due to uncontrolled HTN is also possible. 6. The patient states he drinks EtOH but that the quantity is "not a lot".  7. Stroke risk factors: Prior TIA, DM, HTN, HLD, CAD, cocaine abuse and smoking   Recommendations: 1. Stroke work up to include CTA of neck (had a poor quality MRA study), TTE and cardiac telemetry 2. HgbA1c, fasting lipid panel 3. PT consult, OT consult, Speech consult 4. Start atorvastatin 40 mg po qd.  5. Start ASA 81 mg po qd 6. Start one or more antihypertensive agents. Out of the permissive HTN time window.  7. Will need Case Management consult as the patient appears to be indigent and may need assistance with medications.  8. Frequent neuro checks.   9. NPO until passes stroke swallow screen 10. CIWA protocol     Electronically signed: Dr. Caryl Pina 01/17/2021, 12:57 PM

## 2021-01-17 NOTE — BH Assessment (Signed)
Comprehensive Clinical Assessment (CCA) Note  01/17/2021 Clinton Knight [id number]  Elana Alm, 60 year old male who presents to [facility] ED involuntarily for treatment. Per triage note, [place] [facility] [place]. Per medic report, pt C/o headache, recently homeless, wants help with alcohol, no SI. Alert and oriented. Been off medication (BP meds) for about a month.    During TTS assessment pt presents alert and oriented x 4, restless but cooperative, and mood-congruent with affect. The pt does not appear to be responding to internal or external stimuli. Neither is the pt presenting with any delusional thinking. Pt verified the information provided to triage RN.   Pt identifies his main complaint to be that for the past week he was having a nagging migraine. Patient reports he has medication but due to cost and affordability, he has not been able to pick up his prescriptions. Patient reports in addition to his medical issues, patient uses cocaine and alcohol. Patient reports drinking a 40oz beer and an unknown amount of cocaine on yesterday. Patient reports he has been sleeping well; however, because of the constant drug use he does not have much of an appetite. Pt reports no current INPT or OPT hx. Patient states he lives with his sister but would like to have his own space. Patient is unemployed. Pt reports a medical hx of hypertension. Pt denies current SI/HI/AH/VH.   TTS provided patient with information to local facilities for outpatient substance abuse treatment. Patient verbalized understanding.   Chief Complaint:  Chief Complaint  Patient presents with   Headache   Visit Diagnosis: Substance use disorder    CCA Screening, Triage and Referral (STR)  Patient Reported Information How did you hear about [place]? Self  Referral name: Self  Referral phone number: No data recorded  Whom do you see for routine medical problems? I don't have a doctor  Practice/Facility Name:  No data recorded Practice/Facility Phone Number: No data recorded Name of Contact: No data recorded Contact Number: No data recorded Contact Fax Number: No data recorded Prescriber Name: No data recorded Prescriber Address (if known): No data recorded  What Is the Reason for Your Visit/Call Today? Headache  How Long Has This Been Causing You Problems? <Week  What Do You Feel Would Help You the Most Today? Alcohol or Drug Use Treatment; Medication(s)   Have You Recently Been in Any Inpatient Treatment (Hospital/Detox/Crisis Center/28-Day Program)? No  Name/Location of Program/Hospital:No data recorded How Long Were You There? No data recorded When Were You Discharged? No data recorded  Have You Ever Received Services From [facility] Before? Yes  Who Do You See at [facility]? ED   Have You Recently Had Any Thoughts About Hurting Yourself? No  Are You Planning to Commit Suicide/Harm Yourself At This time? No   Have you Recently Had Thoughts About Hurting Someone Karolee Ohs? No  Explanation: No data recorded  Have You Used Any Alcohol or Drugs in the Past 24 Hours? Yes  How Long Ago Did You Use Drugs or Alcohol? No data recorded What Did You Use and How Much? Cocaine and beer   Do You Currently Have a Therapist/Psychiatrist? No  Name of Therapist/Psychiatrist: No data recorded  Have You Been Recently Discharged From Any Office Practice or Programs? No  Explanation of Discharge From Practice/Program: No data recorded    CCA Screening Triage Referral Assessment Type of Contact: Face-to-Face  Is this Initial or Reassessment? No data recorded Date Telepsych consult ordered in  [facility]:  No data recorded Time Telepsych consult ordered in [facility]:  No data recorded  Patient Reported Information Reviewed? Yes  Patient Left Without Being Seen? No data recorded Reason for Not Completing Assessment: No data recorded  Collateral Involvement: None provided   Does Patient Have a  Court Appointed Legal Guardian? No data recorded Name and Contact of Legal Guardian: No data recorded If Minor and Not Living with Parent(s), Who has Custody? n/a  Is CPS involved or ever been involved? Never  Is APS involved or ever been involved? Never   Patient Determined To Be At Risk for Harm To Self or Others Based on Review of Patient Reported Information or Presenting Complaint? No  Method: No data recorded Availability of Means: No data recorded Intent: No data recorded Notification Required: No data recorded Additional Information for Danger to Others Potential: No data recorded Additional Comments for Danger to Others Potential: No data recorded Are There Guns or Other Weapons in Your Home? No data recorded Types of Guns/Weapons: No data recorded Are These Weapons Safely Secured?                            No data recorded Who Could Verify You Are Able To Have These Secured: No data recorded Do You Have any Outstanding Charges, Pending Court Dates, Parole/Probation? No data recorded Contacted To Inform of Risk of Harm To Self or Others: No data recorded  Location of Assessment: [facility] ED   Does Patient Present under Involuntary Commitment? No  IVC Papers Initial File Date: No data recorded  [place] of Residence: [place]   Patient Currently Receiving the Following Services: Not Receiving Services   Determination of Need: Urgent (48 hours)   Options For Referral: Medication Management; Outpatient Therapy      Recommendations for Services/Supports/Treatments:    DSM5 Diagnoses: Patient Active Problem List   Diagnosis Date Noted   Acute CVA (cerebrovascular accident) (HCC) 01/17/2021   Nicotine dependence 01/17/2021   Non compliance w medication regimen 01/17/2021   Polysubstance abuse (HCC) 01/17/2021   Substance induced mood disorder (HCC) 09/10/2020   Cocaine abuse (HCC) 07/12/2018   NSTEMI (non-ST elevated myocardial infarction) (HCC) 05/03/2018    CKD (chronic kidney disease), stage II    Sleep disturbance    Diabetes mellitus type 2 in obese (HCC)    Leukocytosis    Slow transit constipation    Essential hypertension    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

## 2021-01-17 NOTE — ED Notes (Signed)
Pt to CT

## 2021-01-18 ENCOUNTER — Observation Stay: Payer: Self-pay

## 2021-01-18 ENCOUNTER — Observation Stay (HOSPITAL_COMMUNITY)
Admit: 2021-01-18 | Discharge: 2021-01-18 | Disposition: A | Discharge status: Home / Self Care | Payer: Self-pay | Attending: Internal Medicine | Admitting: Internal Medicine

## 2021-01-18 DIAGNOSIS — I10 Essential (primary) hypertension: Secondary | ICD-10-CM

## 2021-01-18 DIAGNOSIS — I6389 Other cerebral infarction: Secondary | ICD-10-CM

## 2021-01-18 DIAGNOSIS — Z9114 Patient's other noncompliance with medication regimen: Secondary | ICD-10-CM

## 2021-01-18 LAB — URINE DRUG SCREEN, QUALITATIVE (ARMC ONLY)
Amphetamines, Ur Screen: NOT DETECTED
Barbiturates, Ur Screen: NOT DETECTED
Benzodiazepine, Ur Scrn: NOT DETECTED
Cannabinoid 50 Ng, Ur ~~LOC~~: NOT DETECTED
Cocaine Metabolite,Ur ~~LOC~~: POSITIVE — AB
MDMA (Ecstasy)Ur Screen: NOT DETECTED
Methadone Scn, Ur: NOT DETECTED
Opiate, Ur Screen: NOT DETECTED
Phencyclidine (PCP) Ur S: NOT DETECTED
Tricyclic, Ur Screen: NOT DETECTED

## 2021-01-18 LAB — LIPID PANEL
Cholesterol: 141 mg/dL (ref 0–200)
HDL: 38 mg/dL — ABNORMAL LOW (ref >40)
LDL Cholesterol: 89 mg/dL (ref 0–99)
Total CHOL/HDL Ratio: 3.7 RATIO
Triglycerides: 69 mg/dL (ref <150)
VLDL: 14 mg/dL (ref 0–40)

## 2021-01-18 LAB — ECHOCARDIOGRAM COMPLETE
AR max vel: 2.25 cm2
AV Area VTI: 2.73 cm2
AV Area mean vel: 2.39 cm2
AV Mean grad: 5 mmHg
AV Peak grad: 11.7 mmHg
Ao pk vel: 1.71 m/s
Area-P 1/2: 3.1 cm2
S' Lateral: 2.46 cm

## 2021-01-18 LAB — GLUCOSE, CAPILLARY
Glucose-Capillary: 101 mg/dL — ABNORMAL HIGH (ref 70–99)
Glucose-Capillary: 139 mg/dL — ABNORMAL HIGH (ref 70–99)

## 2021-01-18 LAB — HEMOGLOBIN A1C
Hgb A1c MFr Bld: 6.3 % — ABNORMAL HIGH (ref 4.8–5.6)
Mean Plasma Glucose: 134.11 mg/dL

## 2021-01-18 MED ORDER — IOHEXOL 350 MG/ML SOLN
75.0000 mL | Freq: Once | INTRAVENOUS | Status: AC | PRN
  Administered 2021-01-18: 11:00:00 75 mL via INTRAVENOUS

## 2021-01-18 NOTE — Evaluation (Addendum)
Speech Language Pathology Evaluation Patient Details Name: Clinton Knight MRN: [id number] DOB: 09-12-1960 Today's Date: 01/18/2021 Time: 2423-5361 SLP Time Calculation (min) (ACUTE ONLY): 40 min  Problem List:  Patient Active Problem List   Diagnosis Date Noted   Acute CVA (cerebrovascular accident) (HCC) 01/17/2021   Nicotine dependence 01/17/2021   Non compliance w medication regimen 01/17/2021   Polysubstance abuse (HCC) 01/17/2021   Substance induced mood disorder (HCC) 09/10/2020   Cocaine abuse (HCC) 07/12/2018   NSTEMI (non-ST elevated myocardial infarction) (HCC) 05/03/2018   CKD (chronic kidney disease), stage II    Sleep disturbance    Diabetes mellitus type 2 in obese (HCC)    Leukocytosis    Slow transit constipation    Essential hypertension    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   Sinus pause    Stroke (HCC) 03/19/2018   Past Medical History:  Past Medical History:  Diagnosis Date   Acute kidney insufficiency    03/2018: Cr 1.1 in setting of stroke (baseline ~0.9)   Alcohol abuse    Cocaine abuse (HCC)    03/2018: Postitive cocaine UA tox screen   Diabetes (HCC)    03/20/18: A1C 6.4   Hypertension    Hypertriglyceridemia    03/20/18 TG 511   Nicotine abuse    Stroke [facility])    Past Surgical History:  Past Surgical History:  Procedure Laterality Date   LEFT HEART CATH AND CORONARY ANGIOGRAPHY N/A 05/06/2018   Procedure: LEFT HEART CATH AND CORONARY ANGIOGRAPHY;  Surgeon: Iran Ouch, MD;  Location: [facility] INVASIVE CV LAB;  Service: Cardiovascular;  Laterality: N/A;   NO PAST SURGERIES     HPI:  Pt is a 60 y.o. male with prior history of CVA with very minimal residual, left-sided deficits, noncompliant to medications, lcohol/tobacco/cocaine use who presented to the ED with headache and visual field cut. He is recently Homeless. Per MRI/MRA: "Acute bilateral occipital lobe (larger on the left ) and left  posterior temporal and parietal lobe infarcts (PCA  territories).  Associated edema with mild regional mass effect. No midline shift. Remote infarcts in bilateral cerebellar hemispheres and bilateral  basal ganglia with suspected prior hemorrhage in the left basal  ganglia.  2. 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 Severe right P2 PCA stenosis. Multifocal mild-to-moderate  stenosis of the left P2 PCA. Suspected severe stenosis of the distal left ACA (A3/A4).   Assessment / Plan / Recommendation Clinical Impression  Patient was seen for informal language evaluation today at bedside using most aspects of the Bedside Western Aphasia Battery (B-WAB). He asked for an ice cream "if i can have one" which was provided to him during the session. The following were his scores: Content (9/10), Fluency (9/10), Auditory Verbal Comprehension (10/10), Sequential Commands (8/10), Repetition (10/10), Naming (10/10), and Reading at word-short phrase level. Pt's performance on these tasks was grossly [facility] but revealed a slight-mild degree of expressive/receptive language skills seen in more complex language tasks. Unsure of what pt's Baseline Cognitive-communication abilities are in light of previous CVA and Polysubstance use/abuse. Patient did not appear to demonstrate any difficulty in general conversation and w/ general tasks. He was able to make his wants/needs known verbally and was appropriate and pleasant in social conversation.  No Verbal Apraxia or Motor Speech deficits noted. Volume of speech was adequate but often  pt was soft-spoken, quick to respond. Min lack of details noted in some verbal tasks, but No paraphasias or word-finding difficulties apparent. Pt was able to describe in some detail his preferences for sports that were being shown on TV upon entering room. Cognitive status appeared  grossly [place] w/ general Orientation of Time and Place; self. Unsure of his full Baseline status for more in depth problem-solving situation, but he was able to identify the process of using his menu, picking items, then contacting the Dining Services to tell them what he wants for a meal.  SLP provided education re: importance of safety awareness and reaching out to others if needs including 911 post D/C(pt was able to identify 911 on the phone). Encouraged pt to f/u w/ PCP if any needs that could be addressed by skilled ST services. Pt agreed. No further ST services indicated at this time as pt appears close to/at his Cognitive-communication Baseline. NSG/SW updated.    SLP Assessment  SLP Recommendation/Assessment: Patient does not need any further Speech Lanaguage Pathology Services SLP Visit Diagnosis: Cognitive communication deficit (R41.841)    Follow Up Recommendations  None    Frequency and Duration  (n/a)   (n/a)      SLP Evaluation Cognition  Overall Cognitive Status: No family/caregiver present to determine baseline cognitive functioning Arousal/Alertness: Awake/alert Orientation Level: Oriented to person;Oriented to place;Oriented to time;Oriented to situation ("I was having migraines and maybe a stroke") Attention: Focused;Sustained Focused Attention: Appears intact Sustained Attention: Appears intact Awareness: Appears intact Behaviors:  (quick speech, responses) Safety/Judgment: Appears intact Comments: general safety questions; knew to use 911       Comprehension  Auditory Comprehension Overall Auditory Comprehension: Appears within functional limits for tasks assessed Yes/No Questions: Within Functional Limits Commands: Within Functional Limits Conversation: Simple Visual Recognition/Discrimination Discrimination: Not tested Reading Comprehension Reading Status: Within funtional limits (basic info; menu)    Expression Expression Primary Mode of Expression:  Verbal Verbal Expression Overall Verbal Expression: Appears within functional limits for tasks assessed (grossly) Initiation: No impairment Automatic Speech: Name;Social Response;Counting;Day of week Level of Generative/Spontaneous Verbalization: Sentence Repetition: No impairment Naming: No impairment Pragmatics: No impairment (grossly) Non-Verbal Means of Communication: Not applicable Other Verbal Expression Comments: soft spoken Written Expression Dominant Hand: Right Written Expression: Not tested   Oral / Motor  Oral Motor/Sensory Function Overall Oral Motor/Sensory Function: Within functional limits Motor Speech Overall Motor Speech: Appears within functional limits for tasks assessed Respiration: Within functional limits Phonation: Normal Resonance: Within functional limits Articulation: Within functional limitis Intelligibility: Intelligible Motor Planning: Witnin functional limits   GO                      Jerilynn Som, MS, Science writer Language Pathologist Rehab Services [phone] [facility] 01/18/2021, 3:09 PM

## 2021-01-18 NOTE — Progress Notes (Signed)
[facility]                        PROGRESS NOTE        PATIENT DETAILS Name: Clinton Knight Age: 60 y.o. Sex: male Date of Birth: [birth year] Admit Date: 01/17/2021 Admitting Physician Lucile Shutters, MD PCP:Pcp, No  Brief Narrative: Patient is a 60 y.o. male with prior history of CVA with very minimal left-sided deficits, noncompliant to medications, homeless, alcohol/tobacco/cocaine use-presented with headache-visual field cut-found to have acute CVA.  Significant events: 8/8>> admit for headache-visual field deficits-found to have acute CVA.  Significant studies: 8/8>> CT head: Left posterior parietal/occipital lobe densities-likely acute infarct. 8/8>> MRI brain: Acute bilateral occipital lobe (left >right) left posterior temporal/parietal lobe infarct. 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  Headache is only minimal.   Assessment/Plan: Acute CVA: Visual field deficits have improved-work-up in progress-see above-awaiting echo.  Continue aspirin/Plavix/statin-await further recommendations from neurology.  HTN: Allow permissive hypertension-BP appears stable-we will resume antihypertensives over the next few days.  Unfortunately has been noncompliant to medication for almost a year.  Tobacco/alcohol/cocaine use: Counseled extensively-understands risk of life-threatening/life disabling CVA and other cardiovascular risks with  these agents.  Encouraged to quit.  No signs of alcohol or drug withdrawal at this present time-on CIWA protocol with Ativan.  Noncompliance to medication regimen: Counseled-claims noncompliance is due to homelessness/financial issues.  Homelessness: Social work/CM eval   Diet: Diet Order             Diet 2 gram sodium Room service appropriate? Yes; Fluid consistency: Thin  Diet effective now                    Code Status: Full code   Family Communication: None at bedside   Disposition Plan: Status is: Observation  The patient will require care spanning > 2 midnights and should be moved to inpatient because: Inpatient level of care appropriate due to severity of illness  Dispo: The patient is from: Home              Anticipated d/c is to: Home              Patient currently is not medically stable to d/c.   Difficult to place patient No   Barriers to Discharge: Acute CVA-work-up in progress.  Antimicrobial agents: Anti-infectives (From admission, onward)    None        Time spent: 25-minutes-Greater than 50% of this time was spent in counseling, explanation of diagnosis, planning of further management, and coordination of care.  MEDICATIONS: Scheduled Meds:   stroke: mapping our early stages of recovery book   Does not apply Once   aspirin  81 mg Oral Daily   atorvastatin  80 mg Oral Daily   clopidogrel  75 mg Oral Daily   folic acid  1 mg Oral Daily   LORazepam  0-4 mg Oral Q6H   Followed by   Melene Muller ON  01/19/2021] LORazepam  0-4 mg Oral Q12H   multivitamin with minerals  1 tablet Oral Daily   nicotine  21 mg Transdermal Daily   sodium chloride flush  3 mL Intravenous Q12H   thiamine  100 mg Oral Daily   Or   thiamine  100 mg Intravenous Daily   Continuous Infusions:  sodium chloride 10 mL/hr at 01/17/21 1210   sodium chloride     PRN Meds:.sodium chloride, acetaminophen **OR** acetaminophen (TYLENOL) oral liquid 160 mg/5 mL **OR**  acetaminophen, LORazepam **OR** LORazepam, sodium chloride flush   PHYSICAL EXAM: Vital signs: Vitals:   01/17/21 2119 01/18/21 0024 01/18/21 0426 01/18/21 1158  BP: 140/71 131/65 (!) 154/70 139/80  Pulse: (!) 52 (!) 51 (!) 44 (!) 51  Resp: 20 (!) Temp: 98.2 F (36.8 C) 98.4 F (36.9 C) 97.7 F (36.5 C) 98.2 F (36.8 C)  TempSrc: Oral   Oral  SpO2: 99% 98% 99% 100%   There were no vitals filed for this visit. There is no height or weight on file to calculate BMI.   Gen Exam:Alert awake-not in any distress HEENT:atraumatic, normocephalic Chest: B/L clear to auscultation anteriorly CVS:S1S2 regular Abdomen:soft non tender, non distended Extremities:no edema Neurology: Non focal Skin: no rash  I have personally reviewed following labs and imaging studies  LABORATORY DATA: CBC: Recent Labs  Lab 01/17/21 0504  WBC 11.0*  HGB 13.1  HCT 40.8  MCV 89.7  PLT 256    Basic Metabolic Panel: Recent Labs  Lab 01/17/21 0504  NA 140  K 3.9  CL 110  CO2 25  GLUCOSE 107*  BUN 13  CREATININE 0.91  CALCIUM 8.6*    GFR: CrCl cannot be calculated (Unknown ideal weight.).  Liver Function Tests: Recent Labs  Lab 01/17/21 0504  AST 22  ALT 20  ALKPHOS 93  BILITOT 0.5  PROT 6.9  ALBUMIN 3.6   No results for input(s): LIPASE, AMYLASE in the last 168 hours. No results for input(s): AMMONIA in the last 168 hours.  Coagulation Profile: No results for input(s): INR, PROTIME in the last 168 hours.  Cardiac Enzymes: No results for input(s): CKTOTAL, CKMB, CKMBINDEX, TROPONINI in the last 168 hours.  BNP (last 3 results) No results for input(s): PROBNP in the last 8760 hours.  Lipid Profile: Recent Labs    01/17/21 0504 01/18/21 0723  CHOL 188 141  HDL 45 38*  LDLCALC 96 89  TRIG 237* 69  CHOLHDL 4.2 3.7    Thyroid Function Tests: No results for input(s): TSH, T4TOTAL, FREET4, T3FREE, THYROIDAB in the last 72 hours.  Anemia Panel: No results  for input(s): VITAMINB12, FOLATE, FERRITIN, TIBC, IRON, RETICCTPCT in the last 72 hours.  Urine analysis:    Component Value Date/Time   COLORURINE YELLOW 03/28/2018 1433   APPEARANCEUR CLEAR 03/28/2018 1433   APPEARANCEUR Clear 01/29/2014 0536   LABSPEC 1.009 03/28/2018 1433   LABSPEC 1.010 01/29/2014 0536   PHURINE 8.0 03/28/2018 1433   GLUCOSEU NEGATIVE 03/28/2018 1433   GLUCOSEU Negative 01/29/2014 0536   HGBUR NEGATIVE 03/28/2018 1433   BILIRUBINUR NEGATIVE 03/28/2018 1433   BILIRUBINUR Negative 01/29/2014 0536   KETONESUR NEGATIVE 03/28/2018 1433   PROTEINUR NEGATIVE 03/28/2018 1433   NITRITE NEGATIVE 03/28/2018 1433   LEUKOCYTESUR NEGATIVE 03/28/2018 1433   LEUKOCYTESUR Negative 01/29/2014 0536    Sepsis Labs: Lactic Acid, Venous No results found for: LATICACIDVEN  MICROBIOLOGY: Recent Results (from the past 240 hour(s))  Resp Panel by RT-PCR (Flu  A&B, Covid) Nasopharyngeal Swab     Status: None   Collection Time: 01/17/21  9:35 AM   Specimen: Nasopharyngeal Swab; Nasopharyngeal(NP) swabs in vial transport medium  Result Value Ref Range Status   SARS Coronavirus 2 by RT PCR NEGATIVE NEGATIVE Final    Comment: (NOTE) SARS-CoV-2 target nucleic acids are NOT DETECTED.  The SARS-CoV-2 RNA is generally detectable in upper respiratory specimens during the acute phase of infection. The lowest concentration of SARS-CoV-2 viral copies this assay can detect is 138 copies/mL. A negative result does not preclude SARS-Cov-2 infection and should not be used as the sole basis for treatment or other patient management decisions. A negative result may occur with  improper specimen collection/handling, submission of specimen other than nasopharyngeal swab, presence of viral mutation(s) within the areas targeted by this assay, and inadequate number of viral copies(<138 copies/mL). A negative result must be combined with clinical observations, patient history, and  epidemiological information. The expected result is Negative.  Fact Sheet for Patients:  BloggerCourse.com  Fact Sheet for Healthcare Providers:  SeriousBroker.it  This test is no t yet approved or cleared by the [place] FDA and  has been authorized for detection and/or diagnosis of SARS-CoV-2 by FDA under an Emergency Use Authorization (EUA). This EUA will remain  in effect (meaning this test can be used) for the duration of the COVID-19 declaration under Section 564(b)(1) of the Act, 21 U.S.C.section 360bbb-3(b)(1), unless the authorization is terminated  or revoked sooner.       Influenza A by PCR NEGATIVE NEGATIVE Final   Influenza B by PCR NEGATIVE NEGATIVE Final    Comment: (NOTE) The Xpert Xpress SARS-CoV-2/FLU/RSV plus assay is intended as an aid in the diagnosis of influenza from Nasopharyngeal swab specimens and should not be used as a sole basis for treatment. Nasal washings and aspirates are unacceptable for Xpert Xpress SARS-CoV-2/FLU/RSV testing.  Fact Sheet for Patients: BloggerCourse.com  Fact Sheet for Healthcare Providers: SeriousBroker.it  This test is not yet approved or cleared by the [place] FDA and has been authorized for detection and/or diagnosis of SARS-CoV-2 by FDA under an Emergency Use Authorization (EUA). This EUA will remain in effect (meaning this test can be used) for the duration of the COVID-19 declaration under Section 564(b)(1) of the Act, 21 U.S.C. section 360bbb-3(b)(1), unless the authorization is terminated or revoked.  Performed at [facility], [address] [place], [place] [address]     RADIOLOGY STUDIES/RESULTS: DG Chest 2 View  Result Date: 01/17/2021 CLINICAL DATA:  Cough. EXAM: CHEST - 2 VIEW COMPARISON:  None. FINDINGS: The heart size and mediastinal contours are within normal limits. Both lungs are  clear. The visualized skeletal structures are unremarkable. IMPRESSION: No active cardiopulmonary disease. Electronically Signed   By: Lupita Raider M.D.   On: 01/17/2021 09:09   CT HEAD WO CONTRAST ( )  Result Date: 01/17/2021 CLINICAL DATA:  Migraine headache. EXAM: CT HEAD WITHOUT CONTRAST TECHNIQUE: Contiguous axial images were obtained from the base of the skull through the vertex without intravenous contrast. COMPARISON:  March 19, 2018. FINDINGS: Brain: New low densities are seen involving the left posterior parietal cortex as well as left occipital lobe concerning for acute infarction. No mass effect or midline shift is noted. Ventricular size is within normal limits. Old lacunar infarction is noted in left basal ganglia. Vascular: No hyperdense vessel or unexpected calcification. Skull: Normal. Negative for fracture or focal lesion. Sinuses/Orbits: No acute finding. Other: None. IMPRESSION: New low  densities are noted in the left posterior parietal and occipital lobes most consistent with acute infarctions. MRI is recommended for further evaluation. Electronically Signed   By: Lupita Raider M.D.   On: 01/17/2021 09:13   CT ANGIO NECK W OR WO CONTRAST  Result Date: 01/18/2021 CLINICAL DATA:  Stroke, degraded MRA neck EXAM: CT ANGIOGRAPHY NECK TECHNIQUE: Multidetector CT imaging of the neck was performed using the standard protocol during bolus administration of intravenous contrast. Multiplanar CT image reconstructions and MIPs were obtained to evaluate the vascular anatomy. Carotid stenosis measurements (when applicable) are obtained utilizing NASCET criteria, using the distal internal carotid diameter as the denominator. CONTRAST:  75mL OMNIPAQUE IOHEXOL 350 MG/ML SOLN COMPARISON:  October 2019 FINDINGS: Aortic arch: Minimal calcified plaque along the included thoracic aorta. Great vessel origins are patent. There is no subclavian artery stenosis. Right carotid system: Patent. Mixed plaque  along the proximal internal carotid causing less than 50% stenosis. Left carotid system: Patent. Mixed plaque along the proximal internal carotid causing less than 50% stenosis. Vertebral arteries: Patent. Suboptimal visualization of right vertebral origin due to artifact. There is high-grade stenosis of the distal left V1 vertebral artery. This is probably present on the prior study in retrospect though may be increased. The left vertebral artery is dominant. Skeleton: Mild cervical spine degenerative changes. Other neck: Unremarkable. Upper chest: Included lung apices are clear. IMPRESSION: Plaque along the proximal internal carotid causing less than 50% stenosis. 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 This is probably present on the prior 2019 study though may be increased. No apparent resulting flow limitation. Electronically Signed   By: Guadlupe Spanish M.D.   On: 01/18/2021 11:29   MR ANGIO HEAD WO CONTRAST  Result Date: 01/17/2021 CLINICAL DATA:  Neuro deficit, acute, stroke suspected EXAM: MRI HEAD WITHOUT CONTRAST MRA HEAD WITHOUT CONTRAST MRA NECK WITHOUT CONTRAST TECHNIQUE: Multiplanar, multiecho pulse sequences of the brain and surrounding structures were obtained without intravenous contrast. Angiographic images of the Circle of Willis were obtained using MRA technique without intravenous contrast. Angiographic images of the neck were obtained using MRA technique without intravenous contrast. Carotid stenosis measurements (when applicable) are obtained utilizing NASCET criteria, using the distal internal carotid diameter as the denominator. COMPARISON:  None. FINDINGS: MRI HEAD FINDINGS Brain: Acute bilateral occipital lobe (larger on the left ) and left posterior temporal and parietal lobe infarcts. Associated edema with mild regional mass effect. No midline shift. Remote lacunar infarcts in bilateral basal ganglia with suspected prior hemorrhage in the left basal ganglia  given associated susceptibility artifact. Additional remote infarcts in bilateral cerebellar hemispheres, larger on the left. Otherwise, mild scattered T2 hyperintensities in the white matter, likely related to chronic microvascular ischemic disease. No evidence of acute hemorrhage. No mass lesion or extra-axial fluid collection. No hydrocephalus. Vascular: See below. Skull and upper cervical spine: Normal marrow signal. Sinuses/Orbits: Mild ethmoid air cell mucosal thickening. Unremarkable orbits. Other: No mastoid effusions. MRA HEAD FINDINGS Motion limited evaluation. Anterior circulation: Bilateral intracranial ICAs, MCAs, and ACAs are patent. Motion limited evaluation without evidence of a high-grade proximal stenosis. Suspected severe stenosis of the distal left ACA (A3/A4). Posterior circulation: Severe stenosis versus occlusion of the distal right intradural vertebral artery. Left intradural vertebral artery is patent proximally with atherosclerotic narrowing. Severe stenosis at the left vertebrobasilar junction. Moderate to severe stenosis and irregularity of the proximal basilar artery. Bilateral PCAs are patent. Severe stenosis of the right P2 PCA. Multifocal mild-to-moderate stenosis of the  left P2 PCA. No aneurysm identified. MRA NECK FINDINGS Severely limited study. Nondiagnostic evaluation lower neck with nonvisualization of the arteries. There appears to be antegrade flow in the common carotid and internal carotid arteries and the left vertebral artery in the mid upper neck. Likely left dominant vertebral artery. Intermittent poor flow related signal in the right vertebral artery, suggesting potential hemodynamically significant stenosis in the neck. IMPRESSION: MRI: 1. Acute bilateral occipital lobe (larger on the left ) and left posterior temporal and parietal lobe infarcts (PCA territories). Associated edema with mild regional mass effect. No midline shift. 2. Remote infarcts in bilateral  cerebellar hemispheres and bilateral basal ganglia with suspected prior hemorrhage in the left basal ganglia. MRA Head: 1. Severe stenosis versus occlusion of the distal right intradural vertebral artery. 2. Severe stenosis at the left vertebrobasilar junction with moderate to severe stenosis and irregularity of the proximal basilar artery. 3. Severe right P2 PCA stenosis. Multifocal mild-to-moderate stenosis of the left P2 PCA. 4. Suspected severe stenosis of the distal left ACA (A3/A4). 5. Right PICA is patent proximally with poor flow related signal distally. MRA Neck: Severely limited study with nonvisualization of the arteries in the lower neck. Intermittent poor flow related signal of the visualized right vertebral artery, suggesting potential hemodynamically significant stenosis. Antegrade flow in the visualized carotid arteries and left vertebral artery in the mid to upper neck with nondiagnostic evaluation for stenosis. A CTA of the neck could better evaluate if clinically indicated. Electronically Signed   By: Feliberto HartsFrederick S Jones MD   On: 01/17/2021 12:42   MR ANGIO NECK WO CONTRAST  Result Date: 01/17/2021 CLINICAL DATA:  Neuro deficit, acute, stroke suspected EXAM: MRI HEAD WITHOUT CONTRAST MRA HEAD WITHOUT CONTRAST MRA NECK WITHOUT CONTRAST TECHNIQUE: Multiplanar, multiecho pulse sequences of the brain and surrounding structures were obtained without intravenous contrast. Angiographic images of the Circle of Willis were obtained using MRA technique without intravenous contrast. Angiographic images of the neck were obtained using MRA technique without intravenous contrast. Carotid stenosis measurements (when applicable) are obtained utilizing NASCET criteria, using the distal internal carotid diameter as the denominator. COMPARISON:  None. FINDINGS: MRI HEAD FINDINGS Brain: Acute bilateral occipital lobe (larger on the left ) and left posterior temporal and parietal lobe infarcts. Associated edema with  mild regional mass effect. No midline shift. Remote lacunar infarcts in bilateral basal ganglia with suspected prior hemorrhage in the left basal ganglia given associated susceptibility artifact. Additional remote infarcts in bilateral cerebellar hemispheres, larger on the left. Otherwise, mild scattered T2 hyperintensities in the white matter, likely related to chronic microvascular ischemic disease. No evidence of acute hemorrhage. No mass lesion or extra-axial fluid collection. No hydrocephalus. Vascular: See below. Skull and upper cervical spine: Normal marrow signal. Sinuses/Orbits: Mild ethmoid air cell mucosal thickening. Unremarkable orbits. Other: No mastoid effusions. MRA HEAD FINDINGS Motion limited evaluation. Anterior circulation: Bilateral intracranial ICAs, MCAs, and ACAs are patent. Motion limited evaluation without evidence of a high-grade proximal stenosis. Suspected severe stenosis of the distal left ACA (A3/A4). Posterior circulation: Severe stenosis versus occlusion of the distal right intradural vertebral artery. Left intradural vertebral artery is patent proximally with atherosclerotic narrowing. Severe stenosis at the left vertebrobasilar junction. Moderate to severe stenosis and irregularity of the proximal basilar artery. Bilateral PCAs are patent. Severe stenosis of the right P2 PCA. Multifocal mild-to-moderate stenosis of the left P2 PCA. No aneurysm identified. MRA NECK FINDINGS Severely limited study. Nondiagnostic evaluation lower neck with nonvisualization of the arteries. There appears to  be antegrade flow in the common carotid and internal carotid arteries and the left vertebral artery in the mid upper neck. Likely left dominant vertebral artery. Intermittent poor flow related signal in the right vertebral artery, suggesting potential hemodynamically significant stenosis in the neck. IMPRESSION: MRI: 1. Acute bilateral occipital lobe (larger on the left ) and left posterior temporal  and parietal lobe infarcts (PCA territories). Associated edema with mild regional mass effect. No midline shift. 2. Remote infarcts in bilateral cerebellar hemispheres and bilateral basal ganglia with suspected prior hemorrhage in the left basal ganglia. MRA Head: 1. Severe stenosis versus occlusion of the distal right intradural vertebral artery. 2. Severe stenosis at the left vertebrobasilar junction with moderate to severe stenosis and irregularity of the proximal basilar artery. 3. Severe right P2 PCA stenosis. Multifocal mild-to-moderate stenosis of the left P2 PCA. 4. Suspected severe stenosis of the distal left ACA (A3/A4). 5. Right PICA is patent proximally with poor flow related signal distally. MRA Neck: Severely limited study with nonvisualization of the arteries in the lower neck. Intermittent poor flow related signal of the visualized right vertebral artery, suggesting potential hemodynamically significant stenosis. Antegrade flow in the visualized carotid arteries and left vertebral artery in the mid to upper neck with nondiagnostic evaluation for stenosis. A CTA of the neck could better evaluate if clinically indicated. Electronically Signed   By: Feliberto Harts MD   On: 01/17/2021 12:42   MR BRAIN WO CONTRAST  Result Date: 01/17/2021 CLINICAL DATA:  Neuro deficit, acute, stroke suspected EXAM: MRI HEAD WITHOUT CONTRAST MRA HEAD WITHOUT CONTRAST MRA NECK WITHOUT CONTRAST TECHNIQUE: Multiplanar, multiecho pulse sequences of the brain and surrounding structures were obtained without intravenous contrast. Angiographic images of the Circle of Willis were obtained using MRA technique without intravenous contrast. Angiographic images of the neck were obtained using MRA technique without intravenous contrast. Carotid stenosis measurements (when applicable) are obtained utilizing NASCET criteria, using the distal internal carotid diameter as the denominator. COMPARISON:  None. FINDINGS: MRI HEAD  FINDINGS Brain: Acute bilateral occipital lobe (larger on the left ) and left posterior temporal and parietal lobe infarcts. Associated edema with mild regional mass effect. No midline shift. Remote lacunar infarcts in bilateral basal ganglia with suspected prior hemorrhage in the left basal ganglia given associated susceptibility artifact. Additional remote infarcts in bilateral cerebellar hemispheres, larger on the left. Otherwise, mild scattered T2 hyperintensities in the white matter, likely related to chronic microvascular ischemic disease. No evidence of acute hemorrhage. No mass lesion or extra-axial fluid collection. No hydrocephalus. Vascular: See below. Skull and upper cervical spine: Normal marrow signal. Sinuses/Orbits: Mild ethmoid air cell mucosal thickening. Unremarkable orbits. Other: No mastoid effusions. MRA HEAD FINDINGS Motion limited evaluation. Anterior circulation: Bilateral intracranial ICAs, MCAs, and ACAs are patent. Motion limited evaluation without evidence of a high-grade proximal stenosis. Suspected severe stenosis of the distal left ACA (A3/A4). Posterior circulation: Severe stenosis versus occlusion of the distal right intradural vertebral artery. Left intradural vertebral artery is patent proximally with atherosclerotic narrowing. Severe stenosis at the left vertebrobasilar junction. Moderate to severe stenosis and irregularity of the proximal basilar artery. Bilateral PCAs are patent. Severe stenosis of the right P2 PCA. Multifocal mild-to-moderate stenosis of the left P2 PCA. No aneurysm identified. MRA NECK FINDINGS Severely limited study. Nondiagnostic evaluation lower neck with nonvisualization of the arteries. There appears to be antegrade flow in the common carotid and internal carotid arteries and the left vertebral artery in the mid upper neck. Likely left dominant vertebral  artery. Intermittent poor flow related signal in the right vertebral artery, suggesting potential  hemodynamically significant stenosis in the neck. IMPRESSION: MRI: 1. Acute bilateral occipital lobe (larger on the left ) and left posterior temporal and parietal lobe infarcts (PCA territories). Associated edema with mild regional mass effect. No midline shift. 2. Remote infarcts in bilateral cerebellar hemispheres and bilateral basal ganglia with suspected prior hemorrhage in the left basal ganglia. MRA Head: 1. Severe stenosis versus occlusion of the distal right intradural vertebral artery. 2. Severe stenosis at the left vertebrobasilar junction with moderate to severe stenosis and irregularity of the proximal basilar artery. 3. Severe right P2 PCA stenosis. Multifocal mild-to-moderate stenosis of the left P2 PCA. 4. Suspected severe stenosis of the distal left ACA (A3/A4). 5. Right PICA is patent proximally with poor flow related signal distally. MRA Neck: Severely limited study with nonvisualization of the arteries in the lower neck. Intermittent poor flow related signal of the visualized right vertebral artery, suggesting potential hemodynamically significant stenosis. Antegrade flow in the visualized carotid arteries and left vertebral artery in the mid to upper neck with nondiagnostic evaluation for stenosis. A CTA of the neck could better evaluate if clinically indicated. Electronically Signed   By: Feliberto Harts MD   On: 01/17/2021 12:42     LOS: 0 days   Jeoffrey Massed, MD  [facility]    To contact the attending provider between 7A-7P or the covering provider during after hours 7P-7A, please log into the web site [email or web address] and access using universal  password for that web site. If you do not have the password, please call the hospital operator.  01/18/2021, 1:57 PM

## 2021-01-18 NOTE — Progress Notes (Signed)
TTE ordered by Hospitalist team.   CTA neck ordered.   Awaiting results.   Electronically signed: Dr. Caryl Pina

## 2021-01-18 NOTE — Progress Notes (Signed)
*  PRELIMINARY RESULTS* Echocardiogram 2D Echocardiogram has been performed.  Cristela Blue 01/18/2021, 2:41 PM

## 2021-01-18 NOTE — Progress Notes (Signed)
Physical Therapy Treatment Patient Details Name: Clinton Knight MRN: [id number] DOB: 1961-01-15 Today's Date: 01/18/2021    History of Present Illness Per MD notes, pt is a 59 y.o. male who presented to the ER via EMS for evaluation of a 6-day history of left-sided headache, mostly over the left temporal/parietal area. PMH includes hypertension noncompliant with prescribed medications, history of migraine headaches, and history of prior CVA with no focal deficits. MD assessment includes acute CVA, noncompliance with medication regimen, and polysubstance abuse.    PT Comments    Patient is making progress towards meeting PT goals with significant improvement in activity tolerance and independence this session. Patient is Mod I with bed mobility. Supervision provided with transfers and ambulation for safety due to mild dizziness reported with upright activity. Patient able to stand x 3 bouts from the bed and walk a lap around nursing station with supervision and no assistive device.  No significant change in heart rate with activity. Recommend to continue PT to maximize independence and facilitate return to prior level of function.    Follow Up Recommendations  Home health PT     Equipment Recommendations  None recommended by PT    Recommendations for Other Services       Precautions / Restrictions Precautions Precautions: Fall Restrictions Weight Bearing Restrictions: No    Mobility  Bed Mobility Overal bed mobility: Modified Independent                  Transfers Overall transfer level: Needs assistance   Transfers: Sit to/from Stand Sit to Stand: Supervision         General transfer comment: supervision for sit to stand transfers for safety as patient reports dizziness with activity that subsided with increased activity. 3 bouts of standing performed. heart rate monitored throughout session without significant change  Ambulation/Gait Ambulation/Gait assistance:  Supervision Gait Distance (Feet): 200 Feet Assistive device: None Gait Pattern/deviations: Step-through pattern;Decreased stride length Gait velocity: decreased   General Gait Details: patient ambulated around nursing station and in room without assistive device. mild dizziness initially reported with upright activity. patient education on progressing mobility slowly, postural changes/dizziness and fall prevention.   Stairs             Wheelchair Mobility    Modified Rankin (Stroke Patients Only)       Balance   Sitting-balance support: Feet supported;No upper extremity supported Sitting balance-Leahy Scale: Good     Standing balance support: No upper extremity supported Standing balance-Leahy Scale: Fair Standing balance comment: no loss of balance without UE support                            Cognition Arousal/Alertness: Awake/alert Behavior During Therapy: Flat affect Overall Cognitive Status: No family/caregiver present to determine baseline cognitive functioning                                 General Comments: patient able to follow commands with increased time      Exercises      General Comments        Pertinent Vitals/Pain Pain Assessment: No/denies pain    Home Living                      Prior Function            PT Goals (current goals can  now be found in the care plan section) Acute Rehab PT Goals Patient Stated Goal: to feel better PT Goal Formulation: With patient Time For Goal Achievement: 01/30/21 Potential to Achieve Goals: Good Progress towards PT goals: Progressing toward goals    Frequency    7X/week      PT Plan Current plan remains appropriate    Co-evaluation              AM-PAC PT "6 Clicks" Mobility   Outcome Measure  Help needed turning from your back to your side while in a flat bed without using bedrails?: None Help needed moving from lying on your back to sitting  on the side of a flat bed without using bedrails?: A Little Help needed moving to and from a bed to a chair (including a wheelchair)?: A Little Help needed standing up from a chair using your arms (e.g., wheelchair or bedside chair)?: A Little Help needed to walk in hospital room?: A Little Help needed climbing 3-5 steps with a railing? : A Little 6 Click Score: 19    End of Session   Activity Tolerance: Patient tolerated treatment well Patient left: in bed;with call bell/phone within reach;with bed alarm set Nurse Communication: Mobility status PT Visit Diagnosis: Unsteadiness on feet (R26.81);Muscle weakness (generalized) (M62.81);Difficulty in walking, not elsewhere classified (R26.2)     Time: 1610-9604 PT Time Calculation (min) (ACUTE ONLY): 13 min  Charges:  $Therapeutic Activity: 8-22 mins                     Donna Bernard, PT, MPT    Ina Homes 01/18/2021, 12:51 PM

## 2021-01-19 ENCOUNTER — Other Ambulatory Visit: Payer: Self-pay

## 2021-01-19 DIAGNOSIS — F17209 Nicotine dependence, unspecified, with unspecified nicotine-induced disorders: Secondary | ICD-10-CM

## 2021-01-19 LAB — GLUCOSE, CAPILLARY
Glucose-Capillary: 98 mg/dL (ref 70–99)
Glucose-Capillary: 111 mg/dL — ABNORMAL HIGH (ref 70–99)

## 2021-01-19 MED ORDER — LISINOPRIL 20 MG PO TABS
20.0000 mg | ORAL_TABLET | Freq: Every day | ORAL | 3 refills | Status: AC
  Filled 2021-01-19: qty 30, 30d supply, fill #0

## 2021-01-19 MED ORDER — ASPIRIN 81 MG PO CHEW
81.0000 mg | CHEWABLE_TABLET | Freq: Every day | ORAL | 3 refills | Status: AC
  Filled 2021-01-19: qty 30, 30d supply, fill #0

## 2021-01-19 MED ORDER — ATORVASTATIN CALCIUM 80 MG PO TABS
80.0000 mg | ORAL_TABLET | Freq: Every day | ORAL | 3 refills | Status: AC
  Filled 2021-01-19: qty 30, 30d supply, fill #0

## 2021-01-19 MED ORDER — CLOPIDOGREL BISULFATE 75 MG PO TABS
75.0000 mg | ORAL_TABLET | Freq: Every day | ORAL | 3 refills | Status: AC
  Filled 2021-01-19: qty 30, 30d supply, fill #0

## 2021-01-19 NOTE — TOC Initial Note (Signed)
Transition of Care [facility]) - Initial/Assessment Note    Patient Details  Name: Clinton Knight MRN: [id number] Date of Birth: August 28, 1960  [facility] CM/SW Contact:    Allayne Butcher, RN Phone Number: 01/19/2021, 11:05 AM  Clinical Narrative:                 [facility] consult for homelessness, behavioral health had seen patient in the emergency department and provided patient with substance abuse resources.  Patient reports that he was staying with his sister but he can no longer stay with her.  He wants to get out of [place].  RNCM reached out to [facility] and they will accept patient.  RNCM will get medications from [facility] and deliver to the room before patient discharges.  RNCM will also provide patient with transportation through [employer] to [facility].  Patient understands that he will have to work at [employer] and attend church services.    Expected Discharge Plan: Homeless Shelter Barriers to Discharge: No Barriers Identified   Patient Goals and CMS Choice Patient states their goals for this hospitalization and ongoing recovery are:: Patient wants a place to go out of [place] and help with substance abuse      Expected Discharge Plan and Services Expected Discharge Plan: Homeless Shelter   Discharge Planning Services: CM Consult, Medication Assistance   Living arrangements for the past 2 months: Homeless Expected Discharge Date: 01/19/21               DME Arranged: N/A DME Agency: NA       HH Arranged: NA          Prior Living Arrangements/Services Living arrangements for the past 2 months: Homeless   Patient language and need for interpreter reviewed:: Yes Do you feel safe going back to the place where you live?: No   needs a place to go  Need for Family Participation in Patient Care: Yes (Comment) Care giver support system in place?: No (comment)   Criminal Activity/Legal Involvement Pertinent to Current  Situation/Hospitalization: No - Comment as needed  Activities of Daily Living Home Assistive Devices/Equipment: None ADL Screening (condition at time of admission) Patient's cognitive ability adequate to safely complete daily activities?: Yes Is the patient deaf or have difficulty hearing?: No Does the patient have difficulty seeing, even when wearing glasses/contacts?: No Does the patient have difficulty concentrating, remembering, or making decisions?: No Patient able to express need for assistance with ADLs?: Yes Does the patient have difficulty dressing or bathing?: No Independently performs ADLs?: Yes (appropriate for developmental age) Does the patient have difficulty walking or climbing stairs?: No Weakness of Legs: Both Weakness of Arms/Hands: None  Permission Sought/Granted Permission sought to share information with : Oceanographer granted to share information with : Yes, Verbal Permission Granted     Permission granted to share info w AGENCY: [employer]        Emotional Assessment Appearance:: Appears stated age Attitude/Demeanor/Rapport: Engaged Affect (typically observed): Accepting Orientation: : Oriented to Self, Oriented to Place, Oriented to  Time, Oriented to Situation Alcohol / Substance Use: Illicit Drugs Psych Involvement: No (comment)  Admission diagnosis:  Cough [R05.9] Tobacco abuse [Z72.0] Cocaine abuse (HCC) [F14.10] Bronchitis [J40] Acute CVA (cerebrovascular accident) (HCC) [I63.9] Nonintractable headache, unspecified chronicity pattern, unspecified headache type [R51.9] Cerebrovascular accident (CVA), unspecified mechanism (HCC) [I63.9] Patient Active Problem List   Diagnosis Date Noted   Acute CVA (cerebrovascular accident) (HCC)  01/17/2021   Nicotine dependence 01/17/2021   Non compliance w medication regimen 01/17/2021   Polysubstance abuse (HCC) 01/17/2021   Substance induced mood disorder (HCC)  09/10/2020   Cocaine abuse (HCC) 07/12/2018   NSTEMI (non-ST elevated myocardial infarction) (HCC) 05/03/2018   CKD (chronic kidney disease), stage II    Sleep disturbance    Diabetes mellitus type 2 in obese (HCC)    Leukocytosis    Slow transit constipation    Essential hypertension    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 - [place], [place] - [address] [place] [address] Phone: [phone] Fax: [phone]  [id number] [place], [place] - [address] AT [facility] [address] [place] [address] Phone: [phone] Fax: [phone]  [facility] [address] [place] [address] Phone: [phone] Fax: [phone]     Social Determinants of Health (SDOH) Interventions    Readmission Risk Interventions No flowsheet data found.

## 2021-01-19 NOTE — Progress Notes (Signed)
Subjective: The patient has no complaints. He feels ready for discharge.   Objective: Current vital signs: BP (!) 142/84 (BP Location: Right Arm)   Pulse (!) 50   Temp 99.4 F (37.4 C) (Oral)   Resp 16   SpO2 100%  Vital signs in last 24 hours: Temp:  [97.8 F (36.6 C)-99.4 F (37.4 C)] 99.4 F (37.4 C) (08/10 0721) Pulse Rate:  [50-59] 50 (08/10 0721) Resp:  [16-20] 16 (08/10 0721) BP: (127-142)/(62-86) 142/84 (08/10 0721) SpO2:  [99 %-100 %] 100 % (08/10 0721)  Intake/Output from previous day: 08/09 0701 - 08/10 0700 In: 480 [P.O.:480] Out: 2325 [Urine:2325] Intake/Output this shift: Total I/O In: -  Out: 350 [Urine:350] Nutritional status:  Diet Order             Diet 2 gram sodium Room service appropriate? Yes; Fluid consistency: Thin  Diet effective now                  HEENT: [place]/AT Lungs: Respirations unlabored  Neurologic Exam: Ment: Alert and oriented. Speech is fluent with intact comprehension. CN: Fixates and tracks normally. EOMI. Face symmetric.  Motor: Equal strength bilaterally.  Sensory: Intact to touch without asymmetry.  Cerebellar: No gross ataxia noted.   Lab Results: Results for orders placed or performed during the hospital encounter of 01/17/21 (from the past 48 hour(s))  Resp Panel by RT-PCR (Flu A&B, Covid) Nasopharyngeal Swab     Status: None   Collection Time: 01/17/21  9:35 AM   Specimen: Nasopharyngeal Swab; Nasopharyngeal(NP) swabs in vial transport medium  Result Value Ref Range   SARS Coronavirus 2 by RT PCR NEGATIVE NEGATIVE    Comment: (NOTE) SARS-CoV-2 target nucleic acids are NOT DETECTED.  The SARS-CoV-2 RNA is generally detectable in upper respiratory specimens during the acute phase of infection. The lowest concentration of SARS-CoV-2 viral copies this assay can detect is 138 copies/mL. A negative result does not preclude SARS-Cov-2 infection and should not be used as the sole basis for treatment or other patient  management decisions. A negative result may occur with  improper specimen collection/handling, submission of specimen other than nasopharyngeal swab, presence of viral mutation(s) within the areas targeted by this assay, and inadequate number of viral copies(<138 copies/mL). A negative result must be combined with clinical observations, patient history, and epidemiological information. The expected result is Negative.  Fact Sheet for Patients:  BloggerCourse.com  Fact Sheet for Healthcare Providers:  SeriousBroker.it  This test is no t yet approved or cleared by the [place] FDA and  has been authorized for detection and/or diagnosis of SARS-CoV-2 by FDA under an Emergency Use Authorization (EUA). This EUA will remain  in effect (meaning this test can be used) for the duration of the COVID-19 declaration under Section 564(b)(1) of the Act, 21 U.S.C.section 360bbb-3(b)(1), unless the authorization is terminated  or revoked sooner.       Influenza A by PCR NEGATIVE NEGATIVE   Influenza B by PCR NEGATIVE NEGATIVE    Comment: (NOTE) The Xpert Xpress SARS-CoV-2/FLU/RSV plus assay is intended as an aid in the diagnosis of influenza from Nasopharyngeal swab specimens and should not be used as a sole basis for treatment. Nasal washings and aspirates are unacceptable for Xpert Xpress SARS-CoV-2/FLU/RSV testing.  Fact Sheet for Patients: BloggerCourse.com  Fact Sheet for Healthcare Providers: SeriousBroker.it  This test is not yet approved or cleared by the [place] FDA and has been authorized for detection and/or diagnosis of SARS-CoV-2 by FDA  under an Emergency Use Authorization (EUA). This EUA will remain in effect (meaning this test can be used) for the duration of the COVID-19 declaration under Section 564(b)(1) of the Act, 21 U.S.C. section 360bbb-3(b)(1), unless the  authorization is terminated or revoked.  Performed at [facility], [address] [place], [place] [address]   HIV Antibody (routine testing w rflx)     Status: None   Collection Time: 01/17/21 11:15 AM  Result Value Ref Range   HIV Screen 4th Generation wRfx Non Reactive Non Reactive    Comment: Performed at [facility], [address], [place], [place] [address]  Urine Drug Screen, Qualitative     Status: Abnormal   Collection Time: 01/18/21  5:54 AM  Result Value Ref Range   Tricyclic, Ur Screen NONE DETECTED NONE DETECTED   Amphetamines, Ur Screen NONE DETECTED NONE DETECTED   MDMA (Ecstasy)Ur Screen NONE DETECTED NONE DETECTED   Cocaine Metabolite,Ur Ute POSITIVE (A) NONE DETECTED   Opiate, Ur Screen NONE DETECTED NONE DETECTED   Phencyclidine (PCP) Ur S NONE DETECTED NONE DETECTED   Cannabinoid 50 Ng, Ur  NONE DETECTED NONE DETECTED   Barbiturates, Ur Screen NONE DETECTED NONE DETECTED   Benzodiazepine, Ur Scrn NONE DETECTED NONE DETECTED   Methadone Scn, Ur NONE DETECTED NONE DETECTED    Comment: (NOTE) Tricyclics + metabolites, urine    Cutoff 1000 ng/mL Amphetamines + metabolites, urine  Cutoff 1000 ng/mL MDMA (Ecstasy), urine              Cutoff 500 ng/mL Cocaine Metabolite, urine          Cutoff 300 ng/mL Opiate + metabolites, urine        Cutoff 300 ng/mL Phencyclidine (PCP), urine         Cutoff 25 ng/mL Cannabinoid, urine                 Cutoff 50 ng/mL Barbiturates + metabolites, urine  Cutoff 200 ng/mL Benzodiazepine, urine              Cutoff 200 ng/mL Methadone, urine                   Cutoff 300 ng/mL  The urine drug screen provides only a preliminary, unconfirmed analytical test result and should not be used for non-medical purposes. Clinical consideration and professional judgment should be applied to any positive drug screen result due to possible interfering substances. A more specific alternate chemical method must be used in order  to obtain a confirmed analytical result. Gas chromatography / mass spectrometry (GC/MS) is the preferred confirm atory method. Performed at [facility], [address] [place], [place] [address]   Hemoglobin A1c     Status: Abnormal   Collection Time: 01/18/21  7:23 AM  Result Value Ref Range   Hgb A1c MFr Bld 6.3 (H) 4.8 - 5.6 %    Comment: (NOTE) Pre diabetes:          5.7%-6.4%  Diabetes:              >6.4%  Glycemic control for   <7.0% adults with diabetes    Mean Plasma Glucose 134.11 mg/dL    Comment: Performed at [facility], [address], [place], [place] [address]  Lipid panel     Status: Abnormal   Collection Time: 01/18/21  7:23 AM  Result Value Ref Range   Cholesterol 141 0 - 200 mg/dL   Triglycerides  69 <150 mg/dL   HDL 38 (L) >28 mg/dL   Total CHOL/HDL Ratio 3.7 RATIO   VLDL 14 0 - 40 mg/dL   LDL Cholesterol 89 0 - 99 mg/dL    Comment:        Total Cholesterol/HDL:CHD Risk Coronary Heart Disease Risk Table                     Men   Women  1/2 Average Risk   3.4   3.3  Average Risk       5.0   4.4  2 X Average Risk   9.6   7.1  3 X Average Risk  23.4   11.0        Use the calculated Patient Ratio above and the CHD Risk Table to determine the patient's CHD Risk.        ATP III CLASSIFICATION (LDL):  <100     mg/dL   Optimal  315-176  mg/dL   Near or Above                    Optimal  130-159  mg/dL   Borderline  160-737  mg/dL   High  >106     mg/dL   Very High Performed at [facility], [address] [place], [place] [address]   Glucose, capillary     Status: Abnormal   Collection Time: 01/18/21 11:55 AM  Result Value Ref Range   Glucose-Capillary 101 (H) 70 - 99 mg/dL    Comment: Glucose reference range applies only to samples taken after fasting for at least 8 hours.  Glucose, capillary     Status: Abnormal   Collection Time: 01/18/21  5:33 PM  Result Value Ref Range   Glucose-Capillary 139 (H) 70 - 99 mg/dL     Comment: Glucose reference range applies only to samples taken after fasting for at least 8 hours.  Glucose, capillary     Status: None   Collection Time: 01/19/21  7:18 AM  Result Value Ref Range   Glucose-Capillary 98 70 - 99 mg/dL    Comment: Glucose reference range applies only to samples taken after fasting for at least 8 hours.    Recent Results (from the past 240 hour(s))  Resp Panel by RT-PCR (Flu A&B, Covid) Nasopharyngeal Swab     Status: None   Collection Time: 01/17/21  9:35 AM   Specimen: Nasopharyngeal Swab; Nasopharyngeal(NP) swabs in vial transport medium  Result Value Ref Range Status   SARS Coronavirus 2 by RT PCR NEGATIVE NEGATIVE Final    Comment: (NOTE) SARS-CoV-2 target nucleic acids are NOT DETECTED.  The SARS-CoV-2 RNA is generally detectable in upper respiratory specimens during the acute phase of infection. The lowest concentration of SARS-CoV-2 viral copies this assay can detect is 138 copies/mL. A negative result does not preclude SARS-Cov-2 infection and should not be used as the sole basis for treatment or other patient management decisions. A negative result may occur with  improper specimen collection/handling, submission of specimen other than nasopharyngeal swab, presence of viral mutation(s) within the areas targeted by this assay, and inadequate number of viral copies(<138 copies/mL). A negative result must be combined with clinical observations, patient history, and epidemiological information. The expected result is Negative.  Fact Sheet for Patients:  BloggerCourse.com  Fact Sheet for Healthcare Providers:  SeriousBroker.it  This test is no t yet approved or cleared by the [place] and  has been authorized  for detection and/or diagnosis of SARS-CoV-2 by FDA under an Emergency Use Authorization (EUA). This EUA will remain  in effect (meaning this test can be used) for the duration of  the COVID-19 declaration under Section 564(b)(1) of the Act, 21 U.S.C.section 360bbb-3(b)(1), unless the authorization is terminated  or revoked sooner.       Influenza A by PCR NEGATIVE NEGATIVE Final   Influenza B by PCR NEGATIVE NEGATIVE Final    Comment: (NOTE) The Xpert Xpress SARS-CoV-2/FLU/RSV plus assay is intended as an aid in the diagnosis of influenza from Nasopharyngeal swab specimens and should not be used as a sole basis for treatment. Nasal washings and aspirates are unacceptable for Xpert Xpress SARS-CoV-2/FLU/RSV testing.  Fact Sheet for Patients: BloggerCourse.com  Fact Sheet for Healthcare Providers: SeriousBroker.it  This test is not yet approved or cleared by the [place] FDA and has been authorized for detection and/or diagnosis of SARS-CoV-2 by FDA under an Emergency Use Authorization (EUA). This EUA will remain in effect (meaning this test can be used) for the duration of the COVID-19 declaration under Section 564(b)(1) of the Act, 21 U.S.C. section 360bbb-3(b)(1), unless the authorization is terminated or revoked.  Performed at [facility], [address] [place], [place] [address]     Lipid Panel Recent Labs    01/18/21 0723  CHOL 141  TRIG 69  HDL 38*  CHOLHDL 3.7  VLDL 14  LDLCALC 89    Studies/Results: DG Chest 2 View  Result Date: 01/17/2021 CLINICAL DATA:  Cough. EXAM: CHEST - 2 VIEW COMPARISON:  None. FINDINGS: The heart size and mediastinal contours are within normal limits. Both lungs are clear. The visualized skeletal structures are unremarkable. IMPRESSION: No active cardiopulmonary disease. Electronically Signed   By: Lupita Raider M.D.   On: 01/17/2021 09:09   CT HEAD WO CONTRAST ( )  Result Date: 01/17/2021 CLINICAL DATA:  Migraine headache. EXAM: CT HEAD WITHOUT CONTRAST TECHNIQUE: Contiguous axial images were obtained from the base of the skull through the  vertex without intravenous contrast. COMPARISON:  March 19, 2018. FINDINGS: Brain: New low densities are seen involving the left posterior parietal cortex as well as left occipital lobe concerning for acute infarction. No mass effect or midline shift is noted. Ventricular size is within normal limits. Old lacunar infarction is noted in left basal ganglia. Vascular: No hyperdense vessel or unexpected calcification. Skull: Normal. Negative for fracture or focal lesion. Sinuses/Orbits: No acute finding. Other: None. IMPRESSION: New low densities are noted in the left posterior parietal and occipital lobes most consistent with acute infarctions. MRI is recommended for further evaluation. Electronically Signed   By: Lupita Raider M.D.   On: 01/17/2021 09:13   CT ANGIO NECK W OR WO CONTRAST  Result Date: 01/18/2021 CLINICAL DATA:  Stroke, degraded MRA neck EXAM: CT ANGIOGRAPHY NECK TECHNIQUE: Multidetector CT imaging of the neck was performed using the standard protocol during bolus administration of intravenous contrast. Multiplanar CT image reconstructions and MIPs were obtained to evaluate the vascular anatomy. Carotid stenosis measurements (when applicable) are obtained utilizing NASCET criteria, using the distal internal carotid diameter as the denominator. CONTRAST:  75mL OMNIPAQUE IOHEXOL 350 MG/ML SOLN COMPARISON:  October 2019 FINDINGS: Aortic arch: Minimal calcified plaque along the included thoracic aorta. Great vessel origins are patent. There is no subclavian artery stenosis. Right carotid system: Patent. Mixed plaque along the proximal internal carotid causing less than 50% stenosis. Left carotid system: Patent. Mixed plaque along the proximal internal carotid  causing less than 50% stenosis. Vertebral arteries: Patent. Suboptimal visualization of right vertebral origin due to artifact. There is high-grade stenosis of the distal left V1 vertebral artery. This is probably present on the prior study in  retrospect though may be increased. The left vertebral artery is dominant. Skeleton: Mild cervical spine degenerative changes. Other neck: Unremarkable. Upper chest: Included lung apices are clear. IMPRESSION: Plaque along the proximal internal carotid causing less than 50% stenosis. 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 This is probably present on the prior 2019 study though may be increased. No apparent resulting flow limitation. Electronically Signed   By: Guadlupe Spanish M.D.   On: 01/18/2021 11:29   MR ANGIO HEAD WO CONTRAST  Result Date: 01/17/2021 CLINICAL DATA:  Neuro deficit, acute, stroke suspected EXAM: MRI HEAD WITHOUT CONTRAST MRA HEAD WITHOUT CONTRAST MRA NECK WITHOUT CONTRAST TECHNIQUE: Multiplanar, multiecho pulse sequences of the brain and surrounding structures were obtained without intravenous contrast. Angiographic images of the Circle of Willis were obtained using MRA technique without intravenous contrast. Angiographic images of the neck were obtained using MRA technique without intravenous contrast. Carotid stenosis measurements (when applicable) are obtained utilizing NASCET criteria, using the distal internal carotid diameter as the denominator. COMPARISON:  None. FINDINGS: MRI HEAD FINDINGS Brain: Acute bilateral occipital lobe (larger on the left ) and left posterior temporal and parietal lobe infarcts. Associated edema with mild regional mass effect. No midline shift. Remote lacunar infarcts in bilateral basal ganglia with suspected prior hemorrhage in the left basal ganglia given associated susceptibility artifact. Additional remote infarcts in bilateral cerebellar hemispheres, larger on the left. Otherwise, mild scattered T2 hyperintensities in the white matter, likely related to chronic microvascular ischemic disease. No evidence of acute hemorrhage. No mass lesion or extra-axial fluid collection. No hydrocephalus. Vascular: See below. Skull and upper  cervical spine: Normal marrow signal. Sinuses/Orbits: Mild ethmoid air cell mucosal thickening. Unremarkable orbits. Other: No mastoid effusions. MRA HEAD FINDINGS Motion limited evaluation. Anterior circulation: Bilateral intracranial ICAs, MCAs, and ACAs are patent. Motion limited evaluation without evidence of a high-grade proximal stenosis. Suspected severe stenosis of the distal left ACA (A3/A4). Posterior circulation: Severe stenosis versus occlusion of the distal right intradural vertebral artery. Left intradural vertebral artery is patent proximally with atherosclerotic narrowing. Severe stenosis at the left vertebrobasilar junction. Moderate to severe stenosis and irregularity of the proximal basilar artery. Bilateral PCAs are patent. Severe stenosis of the right P2 PCA. Multifocal mild-to-moderate stenosis of the left P2 PCA. No aneurysm identified. MRA NECK FINDINGS Severely limited study. Nondiagnostic evaluation lower neck with nonvisualization of the arteries. There appears to be antegrade flow in the common carotid and internal carotid arteries and the left vertebral artery in the mid upper neck. Likely left dominant vertebral artery. Intermittent poor flow related signal in the right vertebral artery, suggesting potential hemodynamically significant stenosis in the neck. IMPRESSION: MRI: 1. Acute bilateral occipital lobe (larger on the left ) and left posterior temporal and parietal lobe infarcts (PCA territories). Associated edema with mild regional mass effect. No midline shift. 2. Remote infarcts in bilateral cerebellar hemispheres and bilateral basal ganglia with suspected prior hemorrhage in the left basal ganglia. MRA Head: 1. Severe stenosis versus occlusion of the distal right intradural vertebral artery. 2. Severe stenosis at the left vertebrobasilar junction with moderate to severe stenosis and irregularity of the proximal basilar artery. 3. Severe right P2 PCA stenosis. Multifocal  mild-to-moderate stenosis of the left P2 PCA. 4. Suspected severe stenosis  of the distal left ACA (A3/A4). 5. Right PICA is patent proximally with poor flow related signal distally. MRA Neck: Severely limited study with nonvisualization of the arteries in the lower neck. Intermittent poor flow related signal of the visualized right vertebral artery, suggesting potential hemodynamically significant stenosis. Antegrade flow in the visualized carotid arteries and left vertebral artery in the mid to upper neck with nondiagnostic evaluation for stenosis. A CTA of the neck could better evaluate if clinically indicated. Electronically Signed   By: Feliberto Harts MD   On: 01/17/2021 12:42   MR ANGIO NECK WO CONTRAST  Result Date: 01/17/2021 CLINICAL DATA:  Neuro deficit, acute, stroke suspected EXAM: MRI HEAD WITHOUT CONTRAST MRA HEAD WITHOUT CONTRAST MRA NECK WITHOUT CONTRAST TECHNIQUE: Multiplanar, multiecho pulse sequences of the brain and surrounding structures were obtained without intravenous contrast. Angiographic images of the Circle of Willis were obtained using MRA technique without intravenous contrast. Angiographic images of the neck were obtained using MRA technique without intravenous contrast. Carotid stenosis measurements (when applicable) are obtained utilizing NASCET criteria, using the distal internal carotid diameter as the denominator. COMPARISON:  None. FINDINGS: MRI HEAD FINDINGS Brain: Acute bilateral occipital lobe (larger on the left ) and left posterior temporal and parietal lobe infarcts. Associated edema with mild regional mass effect. No midline shift. Remote lacunar infarcts in bilateral basal ganglia with suspected prior hemorrhage in the left basal ganglia given associated susceptibility artifact. Additional remote infarcts in bilateral cerebellar hemispheres, larger on the left. Otherwise, mild scattered T2 hyperintensities in the white matter, likely related to chronic microvascular  ischemic disease. No evidence of acute hemorrhage. No mass lesion or extra-axial fluid collection. No hydrocephalus. Vascular: See below. Skull and upper cervical spine: Normal marrow signal. Sinuses/Orbits: Mild ethmoid air cell mucosal thickening. Unremarkable orbits. Other: No mastoid effusions. MRA HEAD FINDINGS Motion limited evaluation. Anterior circulation: Bilateral intracranial ICAs, MCAs, and ACAs are patent. Motion limited evaluation without evidence of a high-grade proximal stenosis. Suspected severe stenosis of the distal left ACA (A3/A4). Posterior circulation: Severe stenosis versus occlusion of the distal right intradural vertebral artery. Left intradural vertebral artery is patent proximally with atherosclerotic narrowing. Severe stenosis at the left vertebrobasilar junction. Moderate to severe stenosis and irregularity of the proximal basilar artery. Bilateral PCAs are patent. Severe stenosis of the right P2 PCA. Multifocal mild-to-moderate stenosis of the left P2 PCA. No aneurysm identified. MRA NECK FINDINGS Severely limited study. Nondiagnostic evaluation lower neck with nonvisualization of the arteries. There appears to be antegrade flow in the common carotid and internal carotid arteries and the left vertebral artery in the mid upper neck. Likely left dominant vertebral artery. Intermittent poor flow related signal in the right vertebral artery, suggesting potential hemodynamically significant stenosis in the neck. IMPRESSION: MRI: 1. Acute bilateral occipital lobe (larger on the left ) and left posterior temporal and parietal lobe infarcts (PCA territories). Associated edema with mild regional mass effect. No midline shift. 2. Remote infarcts in bilateral cerebellar hemispheres and bilateral basal ganglia with suspected prior hemorrhage in the left basal ganglia. MRA Head: 1. Severe stenosis versus occlusion of the distal right intradural vertebral artery. 2. Severe stenosis at the left  vertebrobasilar junction with moderate to severe stenosis and irregularity of the proximal basilar artery. 3. Severe right P2 PCA stenosis. Multifocal mild-to-moderate stenosis of the left P2 PCA. 4. Suspected severe stenosis of the distal left ACA (A3/A4). 5. Right PICA is patent proximally with poor flow related signal distally. MRA Neck: Severely limited study with  nonvisualization of the arteries in the lower neck. Intermittent poor flow related signal of the visualized right vertebral artery, suggesting potential hemodynamically significant stenosis. Antegrade flow in the visualized carotid arteries and left vertebral artery in the mid to upper neck with nondiagnostic evaluation for stenosis. A CTA of the neck could better evaluate if clinically indicated. Electronically Signed   By: Feliberto Harts MD   On: 01/17/2021 12:42   MR BRAIN WO CONTRAST  Result Date: 01/17/2021 CLINICAL DATA:  Neuro deficit, acute, stroke suspected EXAM: MRI HEAD WITHOUT CONTRAST MRA HEAD WITHOUT CONTRAST MRA NECK WITHOUT CONTRAST TECHNIQUE: Multiplanar, multiecho pulse sequences of the brain and surrounding structures were obtained without intravenous contrast. Angiographic images of the Circle of Willis were obtained using MRA technique without intravenous contrast. Angiographic images of the neck were obtained using MRA technique without intravenous contrast. Carotid stenosis measurements (when applicable) are obtained utilizing NASCET criteria, using the distal internal carotid diameter as the denominator. COMPARISON:  None. FINDINGS: MRI HEAD FINDINGS Brain: Acute bilateral occipital lobe (larger on the left ) and left posterior temporal and parietal lobe infarcts. Associated edema with mild regional mass effect. No midline shift. Remote lacunar infarcts in bilateral basal ganglia with suspected prior hemorrhage in the left basal ganglia given associated susceptibility artifact. Additional remote infarcts in bilateral  cerebellar hemispheres, larger on the left. Otherwise, mild scattered T2 hyperintensities in the white matter, likely related to chronic microvascular ischemic disease. No evidence of acute hemorrhage. No mass lesion or extra-axial fluid collection. No hydrocephalus. Vascular: See below. Skull and upper cervical spine: Normal marrow signal. Sinuses/Orbits: Mild ethmoid air cell mucosal thickening. Unremarkable orbits. Other: No mastoid effusions. MRA HEAD FINDINGS Motion limited evaluation. Anterior circulation: Bilateral intracranial ICAs, MCAs, and ACAs are patent. Motion limited evaluation without evidence of a high-grade proximal stenosis. Suspected severe stenosis of the distal left ACA (A3/A4). Posterior circulation: Severe stenosis versus occlusion of the distal right intradural vertebral artery. Left intradural vertebral artery is patent proximally with atherosclerotic narrowing. Severe stenosis at the left vertebrobasilar junction. Moderate to severe stenosis and irregularity of the proximal basilar artery. Bilateral PCAs are patent. Severe stenosis of the right P2 PCA. Multifocal mild-to-moderate stenosis of the left P2 PCA. No aneurysm identified. MRA NECK FINDINGS Severely limited study. Nondiagnostic evaluation lower neck with nonvisualization of the arteries. There appears to be antegrade flow in the common carotid and internal carotid arteries and the left vertebral artery in the mid upper neck. Likely left dominant vertebral artery. Intermittent poor flow related signal in the right vertebral artery, suggesting potential hemodynamically significant stenosis in the neck. IMPRESSION: MRI: 1. Acute bilateral occipital lobe (larger on the left ) and left posterior temporal and parietal lobe infarcts (PCA territories). Associated edema with mild regional mass effect. No midline shift. 2. Remote infarcts in bilateral cerebellar hemispheres and bilateral basal ganglia with suspected prior hemorrhage in the  left basal ganglia. MRA Head: 1. Severe stenosis versus occlusion of the distal right intradural vertebral artery. 2. Severe stenosis at the left vertebrobasilar junction with moderate to severe stenosis and irregularity of the proximal basilar artery. 3. Severe right P2 PCA stenosis. Multifocal mild-to-moderate stenosis of the left P2 PCA. 4. Suspected severe stenosis of the distal left ACA (A3/A4). 5. Right PICA is patent proximally with poor flow related signal distally. MRA Neck: Severely limited study with nonvisualization of the arteries in the lower neck. Intermittent poor flow related signal of the visualized right vertebral artery, suggesting potential hemodynamically significant stenosis. Antegrade  flow in the visualized carotid arteries and left vertebral artery in the mid to upper neck with nondiagnostic evaluation for stenosis. A CTA of the neck could better evaluate if clinically indicated. Electronically Signed   By: Feliberto Harts MD   On: 01/17/2021 12:42   ECHOCARDIOGRAM COMPLETE  Result Date: 01/18/2021    ECHOCARDIOGRAM REPORT   Patient Name:   Clinton Knight Date of Exam: 01/18/2021 Medical Rec #:  [id number]     Height:       66.0 in Accession #:    [id number]    Weight:       175.0 lb Date of Birth:  [birth year]    BSA:          1.889 m Patient Age:    59 years      BP:           139/80 mmHg Patient Gender: M             HR:           51 bpm. Exam Location:  [facility] Procedure: 2D Echo, Cardiac Doppler and Color Doppler Indications:     Stroke I63.9  History:         Patient has prior history of Echocardiogram examinations, most                  recent 03/20/2018. Stroke; Risk Factors:Hypertension.  Sonographer:     Cristela Blue RDCS (AE) Referring Phys:  [id number] Lucile Shutters Diagnosing Phys: Debbe Odea MD IMPRESSIONS  1. Left ventricular ejection fraction, by estimation, is 60 to 65%. The left ventricle has normal function. The left ventricle has no regional wall motion abnormalities.  There is mild left ventricular hypertrophy. Left ventricular diastolic parameters were normal. The average left ventricular global longitudinal strain is -19.2 %. The global longitudinal strain is normal.  2. Right ventricular systolic function is normal. The right ventricular size is normal.  3. The mitral valve is normal in structure. No evidence of mitral valve regurgitation.  4. The aortic valve is tricuspid. Aortic valve regurgitation is not visualized. Mild aortic valve sclerosis is present, with no evidence of aortic valve stenosis.  5. The inferior vena cava is normal in size with greater than 50% respiratory variability, suggesting right atrial pressure of 3 mmHg. FINDINGS  Left Ventricle: Left ventricular ejection fraction, by estimation, is 60 to 65%. The left ventricle has normal function. The left ventricle has no regional wall motion abnormalities. The average left ventricular global longitudinal strain is -19.2 %. The global longitudinal strain is normal. 3D left ventricular ejection fraction analysis performed but not reported based on interpreter judgement due to suboptimal quality. The left ventricular internal cavity size was normal in size. There is mild left  ventricular hypertrophy. Left ventricular diastolic parameters were normal. Right Ventricle: The right ventricular size is normal. No increase in right ventricular wall thickness. Right ventricular systolic function is normal. Left Atrium: Left atrial size was normal in size. Right Atrium: Right atrial size was normal in size. Pericardium: There is no evidence of pericardial effusion. Mitral Valve: The mitral valve is normal in structure. No evidence of mitral valve regurgitation. Tricuspid Valve: The tricuspid valve is normal in structure. Tricuspid valve regurgitation is not demonstrated. Aortic Valve: The aortic valve is tricuspid. Aortic valve regurgitation is not visualized. Mild aortic valve sclerosis is present, with no evidence of  aortic valve stenosis. Aortic valve mean gradient measures 5.0 mmHg. Aortic valve peak gradient measures 11.7 mmHg.  Aortic valve area, by VTI measures 2.73 cm. Pulmonic Valve: The pulmonic valve was not well visualized. Pulmonic valve regurgitation is trivial. Aorta: The aortic root is normal in size and structure. Venous: The inferior vena cava is normal in size with greater than 50% respiratory variability, suggesting right atrial pressure of 3 mmHg. IAS/Shunts: No atrial level shunt detected by color flow Doppler.  LEFT VENTRICLE PLAX 2D LVIDd:         4.98 cm  Diastology LVIDs:         2.46 cm  LV e' medial:    8.38 cm/s LV PW:         1.11 cm  LV E/e' medial:  10.9 LV IVS:        0.87 cm  LV e' lateral:   10.00 cm/s LVOT diam:     2.10 cm  LV E/e' lateral: 9.2 LV SV:         74 LV SV Index:   39       2D Longitudinal Strain LVOT Area:     3.46 cm 2D Strain GLS Avg:     -19.2 %                          3D Volume EF:                         3D EF:        60 %                         LV EDV:       154 ml                         LV ESV:       62 ml                         LV SV:        92 ml RIGHT VENTRICLE RV Basal diam:  3.09 cm RV S prime:     21.80 cm/s TAPSE (M-mode): 4.7 cm LEFT ATRIUM             Index       RIGHT ATRIUM           Index LA diam:        3.70 cm 1.96 cm/m  RA Area:     14.00 cm LA Vol (A2C):   69.0 ml 36.52 ml/m RA Volume:   33.50 ml  17.73 ml/m LA Vol (A4C):   43.6 ml 23.08 ml/m LA Biplane Vol: 54.6 ml 28.90 ml/m  AORTIC VALVE                    PULMONIC VALVE AV Area (Vmax):    2.25 cm     PV Vmax:        0.72 m/s AV Area (Vmean):   2.39 cm     PV Peak grad:   2.1 mmHg AV Area (VTI):     2.73 cm     RVOT Peak grad: 6 mmHg AV Vmax:           171.00 cm/s AV Vmean:          101.000 cm/s AV VTI:            0.272 m AV Peak  Grad:      11.7 mmHg AV Mean Grad:      5.0 mmHg LVOT Vmax:         111.00 cm/s LVOT Vmean:        69.800 cm/s LVOT VTI:          0.214 m LVOT/AV VTI ratio: 0.79   AORTA Ao Root diam: 3.27 cm MITRAL VALVE               TRICUSPID VALVE MV Area (PHT): 3.10 cm    TR Peak grad:   16.0 mmHg MV Decel Time: 245 msec    TR Vmax:        200.00 cm/s MV E velocity: 91.70 cm/s                            SHUNTS                            Systemic VTI:  0.21 m                            Systemic Diam: 2.10 cm Debbe Odea MD Electronically signed by Debbe Odea MD Signature Date/Time: 01/18/2021/3:07:30 PM    Final     Medications: Scheduled:   stroke: mapping our early stages of recovery book   Does not apply Once   aspirin  81 mg Oral Daily   atorvastatin  80 mg Oral Daily   clopidogrel  75 mg Oral Daily   folic acid  1 mg Oral Daily   LORazepam  0-4 mg Oral Q6H   Followed by   LORazepam  0-4 mg Oral Q12H   multivitamin with minerals  1 tablet Oral Daily   nicotine  21 mg Transdermal Daily   sodium chloride flush  3 mL Intravenous Q12H   thiamine  100 mg Oral Daily   Or   thiamine  100 mg Intravenous Daily   Continuous:  sodium chloride 10 mL/hr at 01/17/21 1210   sodium chloride      Assessment: 60 year old male presenting with bilateral subacute PCA territory infarctions. Severe intracranial atherosclerotic disease noted on MRA.  1. Today's exam reveals baseline speech and mentation. Exam on Monday revealed equivocal right visual field cut and somnolence.  2. MRI brain: Acute bilateral occipital lobe (larger on the left ) and left posterior temporal and parietal lobe infarcts (PCA territories). Associated edema with mild regional mass effect. No midline shift. Remote infarcts in bilateral cerebellar hemispheres and bilateral basal ganglia with suspected prior hemorrhage in the left basal ganglia. 3. MRA Head: Severe stenosis versus occlusion of the distal right intradural vertebral artery. Severe stenosis at the left vertebrobasilar junction with moderate to severe stenosis and irregularity of the proximal basilar artery. Severe right P2 PCA stenosis.  Multifocal mild-to-moderate stenosis of the left P2 PCA. Suspected severe stenosis of the distal left ACA (A3/A4). Right PICA is patent proximally with poor flow related signal distally. 4. MRA Neck: Severely limited study with nonvisualization of the arteries in the lower neck. Intermittent poor flow related signal of the visualized right vertebral artery, suggesting potential hemodynamically significant stenosis. Antegrade flow in the visualized carotid arteries and left vertebral artery in the mid to upper neck with nondiagnostic evaluation for stenosis.  5. CTA neck (follow up to poor quality MRA neck): Mixed plaque along the proximal  internal carotid arteries bilaterally, causing less than 50% stenosis. 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 This is probably present on the prior 2019 study though may be increased. No apparent resulting flow limitation. 6. TTE: LVEF 60 to 65% with normal LV function. No LV regional  wall motion abnormalities. No PFO or mural thrombus mentioned in the report.  7. Most likely component of the DDx for the patient's MRI findings is bilateral strokes (atheroembolic versus cardioembolic). PRES due to uncontrolled HTN is also possible. 8. The patient states he drinks EtOH but that the quantity is "not a lot".  9. Stroke risk factors: Prior TIA, DM, HTN, HLD, CAD, cocaine abuse and smoking    Recommendations: 1. Cardiac telemetry 2. PT/OT/Speech 3. Continue atorvastatin 40 mg po qd, ASA 81 mg po qd and Plavix 75 mg po qd 4. BP management  5. Will need Case Management input as the patient appears to be indigent and may need assistance with medications. 6. Frequent neuro checks. 7. CIWA protocol     LOS: 1 day   @Electronically  signed: Dr. Caryl PinaEric Cherly Erno@ 01/19/2021  8:05 AM

## 2021-01-19 NOTE — TOC Transition Note (Signed)
Transition of Care [facility]) - CM/SW Discharge Note   Patient Details  Name: Clinton Knight MRN: [id number] Date of Birth: July 08, 1960  [facility] CM/SW Contact:  Allayne Butcher, RN Phone Number: 01/19/2021, 2:04 PM   Clinical Narrative:    Patient decided he did not want to go to the [facility] he wanted to stay in [place].  Patient reached out to [facility] but they are not comfortable taking the patient due to his recent stoke.   RN messaged to let RNCM know that patient's sister is picking him up.    Final next level of care: Homeless Shelter [facility]) Barriers to Discharge: No Barriers Identified   Patient Goals and CMS Choice Patient states their goals for this hospitalization and ongoing recovery are:: Patient wants a place to go out of  and help with substance abuse      Discharge Placement                       Discharge Plan and Services   Discharge Planning Services: CM Consult, Medication Assistance            DME Arranged: N/A DME Agency: NA       HH Arranged: NA          Social Determinants of Health (SDOH) Interventions     Readmission Risk Interventions No flowsheet data found.

## 2021-01-19 NOTE — Discharge Summary (Signed)
PATIENT DETAILS Name: Clinton Knight Age: 60 y.o. Sex: male Date of Birth: December 21, 1960 MRN: [id number]. Admitting Physician: Maretta Bees, MD PCP:Pcp, No  Admit Date: 01/17/2021 Discharge date: 01/19/2021  Recommendations for Outpatient Follow-up:  Follow up with PCP in 1-2 weeks Please obtain CMP/CBC in one week Please ensure follow-up with the stroke clinic Continue counseling regarding avoidance of cocaine, alcohol and tobacco  Admitted From:  Home  Disposition: Home   Home Health: No  Equipment/Devices: None  Discharge Condition: Stable  CODE STATUS: FULL CODE  Diet recommendation:  Diet Order             Diet - low sodium heart healthy           Diet 2 gram sodium Room service appropriate? Yes; Fluid consistency: Thin  Diet effective now                   Brief Narrative: Patient is a 60 y.o. male with prior history of CVA with very minimal left-sided deficits, noncompliant to medications, homeless, alcohol/tobacco/cocaine use-presented with headache-visual field cut-found to have acute CVA.   Significant events: 8/8>> admit for headache-visual field deficits-found to have acute CVA.   Significant studies: 8/8>> CT head: Left posterior parietal/occipital lobe densities-likely acute infarct. 8/8>> MRI brain: Acute bilateral occipital lobe (left >right) left posterior temporal/parietal lobe infarct. 8/8>> MRA brain: Severe stenosis of distal right intradural vertebral artery, severe stenosis of left vertebral basilar junction, severe right P2 PCA stenosis, moderate left P2 PCA stenosis, severe stenosis of distal left ACA 8/9>> CT angio neck: Less than 50% stenosis of carotid artery bilaterally, focal high-grade stenosis of distal left V1 8/9>> A1c 6.3 8/9>> LDL: 96 8/9>> UDS: Cocaine positive 8/9>> Echo: EF 60-65%   Antimicrobial therapy: None   Microbiology data: None   Procedures : None   Consults: Neurology  Brief Hospital  Course: Acute CVA: Visual field deficits have improved-work-up as above-discussed with neurologist-Dr. Otelia Limes this morning-recommends indefinite aspirin/Plavix given significant intracranial atherosclerosis.  Continue statin.  Have counseled extensively regarding the importance of completely abstaining from tobacco/cocaine and alcohol use.  He is homeless-and will not be able to get home health-but case manager is seeing if he can qualify for other services.   HTN: Initially permissive hypertension was allowed-we will resume lisinopril on discharge.  Note-he has been noncompliant to medications for more than a year.    Tobacco/alcohol/cocaine use: Counseled extensively-understands risk of life-threatening/life disabling CVA and other cardiovascular risks with these agents.  Encouraged to quit.  No signs of alcohol or drug withdrawal at this present time-was on CIWA protocol with Ativan.   Noncompliance to medication regimen: Counseled-claims noncompliance is due to homelessness/financial issues.   Homelessness: Social work/CM eval-arrangements for shelter are in progress.   Procedures None  Discharge Diagnoses:  Principal Problem:   Acute CVA (cerebrovascular accident) [facility]) Active Problems:   Right pontine stroke (HCC)   Diabetes mellitus type 2 in obese [facility])   Essential hypertension   Nicotine dependence   Non compliance w medication regimen   Polysubstance abuse [facility])   Discharge Instructions:  Activity:  As tolerated   Discharge Instructions     Ambulatory referral to Neurology   Complete by: As directed    Call MD for:  extreme fatigue   Complete by: As directed    Call MD for:  persistant dizziness or light-headedness   Complete by: As directed    Diet - low sodium heart healthy   Complete  by: As directed    Discharge instructions   Complete by: As directed    Follow with Primary MD in 1-2 weeks  Please get a complete blood count and chemistry panel checked by  your Primary MD at your next visit, and again as instructed by your Primary MD.  Get Medicines reviewed and adjusted: Please take all your medications with you for your next visit with your Primary MD  Laboratory/radiological data: Please request your Primary MD to go over all hospital tests and procedure/radiological results at the follow up, please ask your Primary MD to get all Hospital records sent to his/her office.  In some cases, they will be blood work, cultures and biopsy results pending at the time of your discharge. Please request that your primary care M.D. follows up on these results.  Also Note the following: If you experience worsening of your admission symptoms, develop shortness of breath, life threatening emergency, suicidal or homicidal thoughts you must seek medical attention immediately by calling 911 or calling your MD immediately  if symptoms less severe.  You must read complete instructions/literature along with all the possible adverse reactions/side effects for all the Medicines you take and that have been prescribed to you. Take any new Medicines after you have completely understood and accpet all the possible adverse reactions/side effects.   Do not drive when taking Pain medications or sleeping medications (Benzodaizepines)  Do not take more than prescribed Pain, Sleep and Anxiety Medications. It is not advisable to combine anxiety,sleep and pain medications without talking with your primary care practitioner  Special Instructions: If you have smoked or chewed Tobacco  in the last 2 yrs please stop smoking, stop any regular Alcohol  and or any Recreational drug use.  Wear Seat belts while driving.  Please note: You were cared for by a hospitalist during your hospital stay. Once you are discharged, your primary care physician will handle any further medical issues. Please note that NO REFILLS for any discharge medications will be authorized once you are discharged,  as it is imperative that you return to your primary care physician (or establish a relationship with a primary care physician if you do not have one) for your post hospital discharge needs so that they can reassess your need for medications and monitor your lab values.   1.  Stop cocaine/tobacco and alcohol use  2.  Please take your medications as prescribed-you are at high risk to develop further strokes.  3.  No driving/operating heavy machinery due to visual field deficits-unless cleared by outpatient MD.   Increase activity slowly   Complete by: As directed       Allergies as of 01/19/2021   No Known Allergies      Medication List     STOP taking these medications    naproxen 500 MG tablet Commonly known as: Naprosyn       TAKE these medications    aspirin 81 MG chewable tablet Chew 1 tablet (81 mg total) by mouth once daily.   atorvastatin 80 MG tablet Commonly known as: LIPITOR Take 1 tablet (80 mg total) by mouth daily. Start taking on: January 20, 2021 What changed:  medication strength how much to take when to take this   clopidogrel 75 MG tablet Commonly known as: PLAVIX Take 1 tablet (75 mg total) by mouth daily.   lisinopril 20 MG tablet Commonly known as: ZESTRIL Take 1 tablet (20 mg total) by mouth daily.  Follow-up Information     Primary care MD. Schedule an appointment as soon as possible for a visit in 1 week(s).   Why: Hospital follow up               No Known Allergies    Consultations: Neurology   Other Procedures/Studies: DG Chest 2 View  Result Date: 01/17/2021 CLINICAL DATA:  Cough. EXAM: CHEST - 2 VIEW COMPARISON:  None. FINDINGS: The heart size and mediastinal contours are within normal limits. Both lungs are clear. The visualized skeletal structures are unremarkable. IMPRESSION: No active cardiopulmonary disease. Electronically Signed   By: Lupita Raider M.D.   On: 01/17/2021 09:09   CT HEAD WO CONTRAST  ( )  Result Date: 01/17/2021 CLINICAL DATA:  Migraine headache. EXAM: CT HEAD WITHOUT CONTRAST TECHNIQUE: Contiguous axial images were obtained from the base of the skull through the vertex without intravenous contrast. COMPARISON:  March 19, 2018. FINDINGS: Brain: New low densities are seen involving the left posterior parietal cortex as well as left occipital lobe concerning for acute infarction. No mass effect or midline shift is noted. Ventricular size is within normal limits. Old lacunar infarction is noted in left basal ganglia. Vascular: No hyperdense vessel or unexpected calcification. Skull: Normal. Negative for fracture or focal lesion. Sinuses/Orbits: No acute finding. Other: None. IMPRESSION: New low densities are noted in the left posterior parietal and occipital lobes most consistent with acute infarctions. MRI is recommended for further evaluation. Electronically Signed   By: Lupita Raider M.D.   On: 01/17/2021 09:13   CT ANGIO NECK W OR WO CONTRAST  Result Date: 01/18/2021 CLINICAL DATA:  Stroke, degraded MRA neck EXAM: CT ANGIOGRAPHY NECK TECHNIQUE: Multidetector CT imaging of the neck was performed using the standard protocol during bolus administration of intravenous contrast. Multiplanar CT image reconstructions and MIPs were obtained to evaluate the vascular anatomy. Carotid stenosis measurements (when applicable) are obtained utilizing NASCET criteria, using the distal internal carotid diameter as the denominator. CONTRAST:  75mL OMNIPAQUE IOHEXOL 350 MG/ML SOLN COMPARISON:  October 2019 FINDINGS: Aortic arch: Minimal calcified plaque along the included thoracic aorta. Great vessel origins are patent. There is no subclavian artery stenosis. Right carotid system: Patent. Mixed plaque along the proximal internal carotid causing less than 50% stenosis. Left carotid system: Patent. Mixed plaque along the proximal internal carotid causing less than 50% stenosis. Vertebral arteries: Patent.  Suboptimal visualization of right vertebral origin due to artifact. There is high-grade stenosis of the distal left V1 vertebral artery. This is probably present on the prior study in retrospect though may be increased. The left vertebral artery is dominant. Skeleton: Mild cervical spine degenerative changes. Other neck: Unremarkable. Upper chest: Included lung apices are clear. IMPRESSION: Plaque along the proximal internal carotid causing less than 50% stenosis. 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 This is probably present on the prior 2019 study though may be increased. No apparent resulting flow limitation. Electronically Signed   By: Guadlupe Spanish M.D.   On: 01/18/2021 11:29   MR ANGIO HEAD WO CONTRAST  Result Date: 01/17/2021 CLINICAL DATA:  Neuro deficit, acute, stroke suspected EXAM: MRI HEAD WITHOUT CONTRAST MRA HEAD WITHOUT CONTRAST MRA NECK WITHOUT CONTRAST TECHNIQUE: Multiplanar, multiecho pulse sequences of the brain and surrounding structures were obtained without intravenous contrast. Angiographic images of the Circle of Willis were obtained using MRA technique without intravenous contrast. Angiographic images of the neck were obtained using MRA technique without intravenous contrast. Carotid stenosis  measurements (when applicable) are obtained utilizing NASCET criteria, using the distal internal carotid diameter as the denominator. COMPARISON:  None. FINDINGS: MRI HEAD FINDINGS Brain: Acute bilateral occipital lobe (larger on the left ) and left posterior temporal and parietal lobe infarcts. Associated edema with mild regional mass effect. No midline shift. Remote lacunar infarcts in bilateral basal ganglia with suspected prior hemorrhage in the left basal ganglia given associated susceptibility artifact. Additional remote infarcts in bilateral cerebellar hemispheres, larger on the left. Otherwise, mild scattered T2 hyperintensities in the white matter, likely related  to chronic microvascular ischemic disease. No evidence of acute hemorrhage. No mass lesion or extra-axial fluid collection. No hydrocephalus. Vascular: See below. Skull and upper cervical spine: Normal marrow signal. Sinuses/Orbits: Mild ethmoid air cell mucosal thickening. Unremarkable orbits. Other: No mastoid effusions. MRA HEAD FINDINGS Motion limited evaluation. Anterior circulation: Bilateral intracranial ICAs, MCAs, and ACAs are patent. Motion limited evaluation without evidence of a high-grade proximal stenosis. Suspected severe stenosis of the distal left ACA (A3/A4). Posterior circulation: Severe stenosis versus occlusion of the distal right intradural vertebral artery. Left intradural vertebral artery is patent proximally with atherosclerotic narrowing. Severe stenosis at the left vertebrobasilar junction. Moderate to severe stenosis and irregularity of the proximal basilar artery. Bilateral PCAs are patent. Severe stenosis of the right P2 PCA. Multifocal mild-to-moderate stenosis of the left P2 PCA. No aneurysm identified. MRA NECK FINDINGS Severely limited study. Nondiagnostic evaluation lower neck with nonvisualization of the arteries. There appears to be antegrade flow in the common carotid and internal carotid arteries and the left vertebral artery in the mid upper neck. Likely left dominant vertebral artery. Intermittent poor flow related signal in the right vertebral artery, suggesting potential hemodynamically significant stenosis in the neck. IMPRESSION: MRI: 1. Acute bilateral occipital lobe (larger on the left ) and left posterior temporal and parietal lobe infarcts (PCA territories). Associated edema with mild regional mass effect. No midline shift. 2. Remote infarcts in bilateral cerebellar hemispheres and bilateral basal ganglia with suspected prior hemorrhage in the left basal ganglia. MRA Head: 1. Severe stenosis versus occlusion of the distal right intradural vertebral artery. 2. Severe  stenosis at the left vertebrobasilar junction with moderate to severe stenosis and irregularity of the proximal basilar artery. 3. Severe right P2 PCA stenosis. Multifocal mild-to-moderate stenosis of the left P2 PCA. 4. Suspected severe stenosis of the distal left ACA (A3/A4). 5. Right PICA is patent proximally with poor flow related signal distally. MRA Neck: Severely limited study with nonvisualization of the arteries in the lower neck. Intermittent poor flow related signal of the visualized right vertebral artery, suggesting potential hemodynamically significant stenosis. Antegrade flow in the visualized carotid arteries and left vertebral artery in the mid to upper neck with nondiagnostic evaluation for stenosis. A CTA of the neck could better evaluate if clinically indicated. Electronically Signed   By: Feliberto Harts MD   On: 01/17/2021 12:42   MR ANGIO NECK WO CONTRAST  Result Date: 01/17/2021 CLINICAL DATA:  Neuro deficit, acute, stroke suspected EXAM: MRI HEAD WITHOUT CONTRAST MRA HEAD WITHOUT CONTRAST MRA NECK WITHOUT CONTRAST TECHNIQUE: Multiplanar, multiecho pulse sequences of the brain and surrounding structures were obtained without intravenous contrast. Angiographic images of the Circle of Willis were obtained using MRA technique without intravenous contrast. Angiographic images of the neck were obtained using MRA technique without intravenous contrast. Carotid stenosis measurements (when applicable) are obtained utilizing NASCET criteria, using the distal internal carotid diameter as the denominator. COMPARISON:  None. FINDINGS: MRI HEAD FINDINGS  Brain: Acute bilateral occipital lobe (larger on the left ) and left posterior temporal and parietal lobe infarcts. Associated edema with mild regional mass effect. No midline shift. Remote lacunar infarcts in bilateral basal ganglia with suspected prior hemorrhage in the left basal ganglia given associated susceptibility artifact. Additional remote  infarcts in bilateral cerebellar hemispheres, larger on the left. Otherwise, mild scattered T2 hyperintensities in the white matter, likely related to chronic microvascular ischemic disease. No evidence of acute hemorrhage. No mass lesion or extra-axial fluid collection. No hydrocephalus. Vascular: See below. Skull and upper cervical spine: Normal marrow signal. Sinuses/Orbits: Mild ethmoid air cell mucosal thickening. Unremarkable orbits. Other: No mastoid effusions. MRA HEAD FINDINGS Motion limited evaluation. Anterior circulation: Bilateral intracranial ICAs, MCAs, and ACAs are patent. Motion limited evaluation without evidence of a high-grade proximal stenosis. Suspected severe stenosis of the distal left ACA (A3/A4). Posterior circulation: Severe stenosis versus occlusion of the distal right intradural vertebral artery. Left intradural vertebral artery is patent proximally with atherosclerotic narrowing. Severe stenosis at the left vertebrobasilar junction. Moderate to severe stenosis and irregularity of the proximal basilar artery. Bilateral PCAs are patent. Severe stenosis of the right P2 PCA. Multifocal mild-to-moderate stenosis of the left P2 PCA. No aneurysm identified. MRA NECK FINDINGS Severely limited study. Nondiagnostic evaluation lower neck with nonvisualization of the arteries. There appears to be antegrade flow in the common carotid and internal carotid arteries and the left vertebral artery in the mid upper neck. Likely left dominant vertebral artery. Intermittent poor flow related signal in the right vertebral artery, suggesting potential hemodynamically significant stenosis in the neck. IMPRESSION: MRI: 1. Acute bilateral occipital lobe (larger on the left ) and left posterior temporal and parietal lobe infarcts (PCA territories). Associated edema with mild regional mass effect. No midline shift. 2. Remote infarcts in bilateral cerebellar hemispheres and bilateral basal ganglia with suspected  prior hemorrhage in the left basal ganglia. MRA Head: 1. Severe stenosis versus occlusion of the distal right intradural vertebral artery. 2. Severe stenosis at the left vertebrobasilar junction with moderate to severe stenosis and irregularity of the proximal basilar artery. 3. Severe right P2 PCA stenosis. Multifocal mild-to-moderate stenosis of the left P2 PCA. 4. Suspected severe stenosis of the distal left ACA (A3/A4). 5. Right PICA is patent proximally with poor flow related signal distally. MRA Neck: Severely limited study with nonvisualization of the arteries in the lower neck. Intermittent poor flow related signal of the visualized right vertebral artery, suggesting potential hemodynamically significant stenosis. Antegrade flow in the visualized carotid arteries and left vertebral artery in the mid to upper neck with nondiagnostic evaluation for stenosis. A CTA of the neck could better evaluate if clinically indicated. Electronically Signed   By: Feliberto Harts MD   On: 01/17/2021 12:42   MR BRAIN WO CONTRAST  Result Date: 01/17/2021 CLINICAL DATA:  Neuro deficit, acute, stroke suspected EXAM: MRI HEAD WITHOUT CONTRAST MRA HEAD WITHOUT CONTRAST MRA NECK WITHOUT CONTRAST TECHNIQUE: Multiplanar, multiecho pulse sequences of the brain and surrounding structures were obtained without intravenous contrast. Angiographic images of the Circle of Willis were obtained using MRA technique without intravenous contrast. Angiographic images of the neck were obtained using MRA technique without intravenous contrast. Carotid stenosis measurements (when applicable) are obtained utilizing NASCET criteria, using the distal internal carotid diameter as the denominator. COMPARISON:  None. FINDINGS: MRI HEAD FINDINGS Brain: Acute bilateral occipital lobe (larger on the left ) and left posterior temporal and parietal lobe infarcts. Associated edema with mild regional mass effect.  No midline shift. Remote lacunar infarcts in  bilateral basal ganglia with suspected prior hemorrhage in the left basal ganglia given associated susceptibility artifact. Additional remote infarcts in bilateral cerebellar hemispheres, larger on the left. Otherwise, mild scattered T2 hyperintensities in the white matter, likely related to chronic microvascular ischemic disease. No evidence of acute hemorrhage. No mass lesion or extra-axial fluid collection. No hydrocephalus. Vascular: See below. Skull and upper cervical spine: Normal marrow signal. Sinuses/Orbits: Mild ethmoid air cell mucosal thickening. Unremarkable orbits. Other: No mastoid effusions. MRA HEAD FINDINGS Motion limited evaluation. Anterior circulation: Bilateral intracranial ICAs, MCAs, and ACAs are patent. Motion limited evaluation without evidence of a high-grade proximal stenosis. Suspected severe stenosis of the distal left ACA (A3/A4). Posterior circulation: Severe stenosis versus occlusion of the distal right intradural vertebral artery. Left intradural vertebral artery is patent proximally with atherosclerotic narrowing. Severe stenosis at the left vertebrobasilar junction. Moderate to severe stenosis and irregularity of the proximal basilar artery. Bilateral PCAs are patent. Severe stenosis of the right P2 PCA. Multifocal mild-to-moderate stenosis of the left P2 PCA. No aneurysm identified. MRA NECK FINDINGS Severely limited study. Nondiagnostic evaluation lower neck with nonvisualization of the arteries. There appears to be antegrade flow in the common carotid and internal carotid arteries and the left vertebral artery in the mid upper neck. Likely left dominant vertebral artery. Intermittent poor flow related signal in the right vertebral artery, suggesting potential hemodynamically significant stenosis in the neck. IMPRESSION: MRI: 1. Acute bilateral occipital lobe (larger on the left ) and left posterior temporal and parietal lobe infarcts (PCA territories). Associated edema with  mild regional mass effect. No midline shift. 2. Remote infarcts in bilateral cerebellar hemispheres and bilateral basal ganglia with suspected prior hemorrhage in the left basal ganglia. MRA Head: 1. Severe stenosis versus occlusion of the distal right intradural vertebral artery. 2. Severe stenosis at the left vertebrobasilar junction with moderate to severe stenosis and irregularity of the proximal basilar artery. 3. Severe right P2 PCA stenosis. Multifocal mild-to-moderate stenosis of the left P2 PCA. 4. Suspected severe stenosis of the distal left ACA (A3/A4). 5. Right PICA is patent proximally with poor flow related signal distally. MRA Neck: Severely limited study with nonvisualization of the arteries in the lower neck. Intermittent poor flow related signal of the visualized right vertebral artery, suggesting potential hemodynamically significant stenosis. Antegrade flow in the visualized carotid arteries and left vertebral artery in the mid to upper neck with nondiagnostic evaluation for stenosis. A CTA of the neck could better evaluate if clinically indicated. Electronically Signed   By: Feliberto Harts MD   On: 01/17/2021 12:42   ECHOCARDIOGRAM COMPLETE  Result Date: 01/18/2021    ECHOCARDIOGRAM REPORT   Patient Name:   Clinton Knight Date of Exam: 01/18/2021 Medical Rec #:  [id number]     Height:       66.0 in Accession #:    [id number]    Weight:       175.0 lb Date of Birth:  [birth year]    BSA:          1.889 m Patient Age:    59 years      BP:           139/80 mmHg Patient Gender: M             HR:           51 bpm. Exam Location:  [facility] Procedure: 2D Echo, Cardiac Doppler and Color Doppler Indications:  Stroke I63.9  History:         Patient has prior history of Echocardiogram examinations, most                  recent 03/20/2018. Stroke; Risk Factors:Hypertension.  Sonographer:     Cristela Blue RDCS (AE) Referring Phys:  [id number] Lucile Shutters Diagnosing Phys: Debbe Odea MD IMPRESSIONS  1.  Left ventricular ejection fraction, by estimation, is 60 to 65%. The left ventricle has normal function. The left ventricle has no regional wall motion abnormalities. There is mild left ventricular hypertrophy. Left ventricular diastolic parameters were normal. The average left ventricular global longitudinal strain is -19.2 %. The global longitudinal strain is normal.  2. Right ventricular systolic function is normal. The right ventricular size is normal.  3. The mitral valve is normal in structure. No evidence of mitral valve regurgitation.  4. The aortic valve is tricuspid. Aortic valve regurgitation is not visualized. Mild aortic valve sclerosis is present, with no evidence of aortic valve stenosis.  5. The inferior vena cava is normal in size with greater than 50% respiratory variability, suggesting right atrial pressure of 3 mmHg. FINDINGS  Left Ventricle: Left ventricular ejection fraction, by estimation, is 60 to 65%. The left ventricle has normal function. The left ventricle has no regional wall motion abnormalities. The average left ventricular global longitudinal strain is -19.2 %. The global longitudinal strain is normal. 3D left ventricular ejection fraction analysis performed but not reported based on interpreter judgement due to suboptimal quality. The left ventricular internal cavity size was normal in size. There is mild left  ventricular hypertrophy. Left ventricular diastolic parameters were normal. Right Ventricle: The right ventricular size is normal. No increase in right ventricular wall thickness. Right ventricular systolic function is normal. Left Atrium: Left atrial size was normal in size. Right Atrium: Right atrial size was normal in size. Pericardium: There is no evidence of pericardial effusion. Mitral Valve: The mitral valve is normal in structure. No evidence of mitral valve regurgitation. Tricuspid Valve: The tricuspid valve is normal in structure. Tricuspid valve regurgitation is not  demonstrated. Aortic Valve: The aortic valve is tricuspid. Aortic valve regurgitation is not visualized. Mild aortic valve sclerosis is present, with no evidence of aortic valve stenosis. Aortic valve mean gradient measures 5.0 mmHg. Aortic valve peak gradient measures 11.7 mmHg. Aortic valve area, by VTI measures 2.73 cm. Pulmonic Valve: The pulmonic valve was not well visualized. Pulmonic valve regurgitation is trivial. Aorta: The aortic root is normal in size and structure. Venous: The inferior vena cava is normal in size with greater than 50% respiratory variability, suggesting right atrial pressure of 3 mmHg. IAS/Shunts: No atrial level shunt detected by color flow Doppler.  LEFT VENTRICLE PLAX 2D LVIDd:         4.98 cm  Diastology LVIDs:         2.46 cm  LV e' medial:    8.38 cm/s LV PW:         1.11 cm  LV E/e' medial:  10.9 LV IVS:        0.87 cm  LV e' lateral:   10.00 cm/s LVOT diam:     2.10 cm  LV E/e' lateral: 9.2 LV SV:         74 LV SV Index:   39       2D Longitudinal Strain LVOT Area:     3.46 cm 2D Strain GLS Avg:     -19.2 %  3D Volume EF:                         3D EF:        60 %                         LV EDV:       154 ml                         LV ESV:       62 ml                         LV SV:        92 ml RIGHT VENTRICLE RV Basal diam:  3.09 cm RV S prime:     21.80 cm/s TAPSE (M-mode): 4.7 cm LEFT ATRIUM             Index       RIGHT ATRIUM           Index LA diam:        3.70 cm 1.96 cm/m  RA Area:     14.00 cm LA Vol (A2C):   69.0 ml 36.52 ml/m RA Volume:   33.50 ml  17.73 ml/m LA Vol (A4C):   43.6 ml 23.08 ml/m LA Biplane Vol: 54.6 ml 28.90 ml/m  AORTIC VALVE                    PULMONIC VALVE AV Area (Vmax):    2.25 cm     PV Vmax:        0.72 m/s AV Area (Vmean):   2.39 cm     PV Peak grad:   2.1 mmHg AV Area (VTI):     2.73 cm     RVOT Peak grad: 6 mmHg AV Vmax:           171.00 cm/s AV Vmean:          101.000 cm/s AV VTI:            0.272 m AV Peak  Grad:      11.7 mmHg AV Mean Grad:      5.0 mmHg LVOT Vmax:         111.00 cm/s LVOT Vmean:        69.800 cm/s LVOT VTI:          0.214 m LVOT/AV VTI ratio: 0.79  AORTA Ao Root diam: 3.27 cm MITRAL VALVE               TRICUSPID VALVE MV Area (PHT): 3.10 cm    TR Peak grad:   16.0 mmHg MV Decel Time: 245 msec    TR Vmax:        200.00 cm/s MV E velocity: 91.70 cm/s                            SHUNTS                            Systemic VTI:  0.21 m                            Systemic Diam: 2.10 cm Debbe Odea MD Electronically signed by Arlys John  Agbor-Etang MD Signature Date/Time: 01/18/2021/3:07:30 PM    Final      TODAY-DAY OF DISCHARGE:  Subjective:   Ibraham Croft today has no headache,no chest abdominal pain,no new weakness tingling or numbness, feels much better wants to go home today.   Objective:   Blood pressure (!) 142/84, pulse (!) 50, temperature 99.4 F (37.4 C), temperature source Oral, resp. rate 16, SpO2 100 %.  Intake/Output Summary (Last 24 hours) at 01/19/2021 1055 Last data filed at 01/19/2021 1026 Gross per 24 hour  Intake 240 ml  Output 2675 ml  Net -2435 ml   There were no vitals filed for this visit.  Exam: Awake Alert, Oriented *3, No new F.N deficits, Normal affect [place].AT,PERRAL Supple Neck,No JVD, No cervical lymphadenopathy appriciated.  Symmetrical Chest wall movement, Good air movement bilaterally, CTAB RRR,No Gallops,Rubs or new Murmurs, No Parasternal Heave +ve B.Sounds, Abd Soft, Non tender, No organomegaly appriciated, No rebound -guarding or rigidity. No Cyanosis, Clubbing or edema, No new Rash or bruise   PERTINENT RADIOLOGIC STUDIES: CT ANGIO NECK W OR WO CONTRAST  Result Date: 01/18/2021 CLINICAL DATA:  Stroke, degraded MRA neck EXAM: CT ANGIOGRAPHY NECK TECHNIQUE: Multidetector CT imaging of the neck was performed using the standard protocol during bolus administration of intravenous contrast. Multiplanar CT image reconstructions and MIPs were  obtained to evaluate the vascular anatomy. Carotid stenosis measurements (when applicable) are obtained utilizing NASCET criteria, using the distal internal carotid diameter as the denominator. CONTRAST:  3mL OMNIPAQUE IOHEXOL 350 MG/ML SOLN COMPARISON:  October 2019 FINDINGS: Aortic arch: Minimal calcified plaque along the included thoracic aorta. Great vessel origins are patent. There is no subclavian artery stenosis. Right carotid system: Patent. Mixed plaque along the proximal internal carotid causing less than 50% stenosis. Left carotid system: Patent. Mixed plaque along the proximal internal carotid causing less than 50% stenosis. Vertebral arteries: Patent. Suboptimal visualization of right vertebral origin due to artifact. There is high-grade stenosis of the distal left V1 vertebral artery. This is probably present on the prior study in retrospect though may be increased. The left vertebral artery is dominant. Skeleton: Mild cervical spine degenerative changes. Other neck: Unremarkable. Upper chest: Included lung apices are clear. IMPRESSION: Plaque along the proximal internal carotid causing less than 50% stenosis. 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 This is probably present on the prior 2019 study though may be increased. No apparent resulting flow limitation. Electronically Signed   By: Guadlupe Spanish M.D.   On: 01/18/2021 11:29   MR ANGIO HEAD WO CONTRAST  Result Date: 01/17/2021 CLINICAL DATA:  Neuro deficit, acute, stroke suspected EXAM: MRI HEAD WITHOUT CONTRAST MRA HEAD WITHOUT CONTRAST MRA NECK WITHOUT CONTRAST TECHNIQUE: Multiplanar, multiecho pulse sequences of the brain and surrounding structures were obtained without intravenous contrast. Angiographic images of the Circle of Willis were obtained using MRA technique without intravenous contrast. Angiographic images of the neck were obtained using MRA technique without intravenous contrast. Carotid stenosis  measurements (when applicable) are obtained utilizing NASCET criteria, using the distal internal carotid diameter as the denominator. COMPARISON:  None. FINDINGS: MRI HEAD FINDINGS Brain: Acute bilateral occipital lobe (larger on the left ) and left posterior temporal and parietal lobe infarcts. Associated edema with mild regional mass effect. No midline shift. Remote lacunar infarcts in bilateral basal ganglia with suspected prior hemorrhage in the left basal ganglia given associated susceptibility artifact. Additional remote infarcts in bilateral cerebellar hemispheres, larger on the left. Otherwise, mild scattered T2 hyperintensities in the  white matter, likely related to chronic microvascular ischemic disease. No evidence of acute hemorrhage. No mass lesion or extra-axial fluid collection. No hydrocephalus. Vascular: See below. Skull and upper cervical spine: Normal marrow signal. Sinuses/Orbits: Mild ethmoid air cell mucosal thickening. Unremarkable orbits. Other: No mastoid effusions. MRA HEAD FINDINGS Motion limited evaluation. Anterior circulation: Bilateral intracranial ICAs, MCAs, and ACAs are patent. Motion limited evaluation without evidence of a high-grade proximal stenosis. Suspected severe stenosis of the distal left ACA (A3/A4). Posterior circulation: Severe stenosis versus occlusion of the distal right intradural vertebral artery. Left intradural vertebral artery is patent proximally with atherosclerotic narrowing. Severe stenosis at the left vertebrobasilar junction. Moderate to severe stenosis and irregularity of the proximal basilar artery. Bilateral PCAs are patent. Severe stenosis of the right P2 PCA. Multifocal mild-to-moderate stenosis of the left P2 PCA. No aneurysm identified. MRA NECK FINDINGS Severely limited study. Nondiagnostic evaluation lower neck with nonvisualization of the arteries. There appears to be antegrade flow in the common carotid and internal carotid arteries and the left  vertebral artery in the mid upper neck. Likely left dominant vertebral artery. Intermittent poor flow related signal in the right vertebral artery, suggesting potential hemodynamically significant stenosis in the neck. IMPRESSION: MRI: 1. Acute bilateral occipital lobe (larger on the left ) and left posterior temporal and parietal lobe infarcts (PCA territories). Associated edema with mild regional mass effect. No midline shift. 2. Remote infarcts in bilateral cerebellar hemispheres and bilateral basal ganglia with suspected prior hemorrhage in the left basal ganglia. MRA Head: 1. Severe stenosis versus occlusion of the distal right intradural vertebral artery. 2. Severe stenosis at the left vertebrobasilar junction with moderate to severe stenosis and irregularity of the proximal basilar artery. 3. Severe right P2 PCA stenosis. Multifocal mild-to-moderate stenosis of the left P2 PCA. 4. Suspected severe stenosis of the distal left ACA (A3/A4). 5. Right PICA is patent proximally with poor flow related signal distally. MRA Neck: Severely limited study with nonvisualization of the arteries in the lower neck. Intermittent poor flow related signal of the visualized right vertebral artery, suggesting potential hemodynamically significant stenosis. Antegrade flow in the visualized carotid arteries and left vertebral artery in the mid to upper neck with nondiagnostic evaluation for stenosis. A CTA of the neck could better evaluate if clinically indicated. Electronically Signed   By: Feliberto Harts MD   On: 01/17/2021 12:42   MR ANGIO NECK WO CONTRAST  Result Date: 01/17/2021 CLINICAL DATA:  Neuro deficit, acute, stroke suspected EXAM: MRI HEAD WITHOUT CONTRAST MRA HEAD WITHOUT CONTRAST MRA NECK WITHOUT CONTRAST TECHNIQUE: Multiplanar, multiecho pulse sequences of the brain and surrounding structures were obtained without intravenous contrast. Angiographic images of the Circle of Willis were obtained using MRA  technique without intravenous contrast. Angiographic images of the neck were obtained using MRA technique without intravenous contrast. Carotid stenosis measurements (when applicable) are obtained utilizing NASCET criteria, using the distal internal carotid diameter as the denominator. COMPARISON:  None. FINDINGS: MRI HEAD FINDINGS Brain: Acute bilateral occipital lobe (larger on the left ) and left posterior temporal and parietal lobe infarcts. Associated edema with mild regional mass effect. No midline shift. Remote lacunar infarcts in bilateral basal ganglia with suspected prior hemorrhage in the left basal ganglia given associated susceptibility artifact. Additional remote infarcts in bilateral cerebellar hemispheres, larger on the left. Otherwise, mild scattered T2 hyperintensities in the white matter, likely related to chronic microvascular ischemic disease. No evidence of acute hemorrhage. No mass lesion or extra-axial fluid collection. No hydrocephalus. Vascular:  See below. Skull and upper cervical spine: Normal marrow signal. Sinuses/Orbits: Mild ethmoid air cell mucosal thickening. Unremarkable orbits. Other: No mastoid effusions. MRA HEAD FINDINGS Motion limited evaluation. Anterior circulation: Bilateral intracranial ICAs, MCAs, and ACAs are patent. Motion limited evaluation without evidence of a high-grade proximal stenosis. Suspected severe stenosis of the distal left ACA (A3/A4). Posterior circulation: Severe stenosis versus occlusion of the distal right intradural vertebral artery. Left intradural vertebral artery is patent proximally with atherosclerotic narrowing. Severe stenosis at the left vertebrobasilar junction. Moderate to severe stenosis and irregularity of the proximal basilar artery. Bilateral PCAs are patent. Severe stenosis of the right P2 PCA. Multifocal mild-to-moderate stenosis of the left P2 PCA. No aneurysm identified. MRA NECK FINDINGS Severely limited study. Nondiagnostic  evaluation lower neck with nonvisualization of the arteries. There appears to be antegrade flow in the common carotid and internal carotid arteries and the left vertebral artery in the mid upper neck. Likely left dominant vertebral artery. Intermittent poor flow related signal in the right vertebral artery, suggesting potential hemodynamically significant stenosis in the neck. IMPRESSION: MRI: 1. Acute bilateral occipital lobe (larger on the left ) and left posterior temporal and parietal lobe infarcts (PCA territories). Associated edema with mild regional mass effect. No midline shift. 2. Remote infarcts in bilateral cerebellar hemispheres and bilateral basal ganglia with suspected prior hemorrhage in the left basal ganglia. MRA Head: 1. Severe stenosis versus occlusion of the distal right intradural vertebral artery. 2. Severe stenosis at the left vertebrobasilar junction with moderate to severe stenosis and irregularity of the proximal basilar artery. 3. Severe right P2 PCA stenosis. Multifocal mild-to-moderate stenosis of the left P2 PCA. 4. Suspected severe stenosis of the distal left ACA (A3/A4). 5. Right PICA is patent proximally with poor flow related signal distally. MRA Neck: Severely limited study with nonvisualization of the arteries in the lower neck. Intermittent poor flow related signal of the visualized right vertebral artery, suggesting potential hemodynamically significant stenosis. Antegrade flow in the visualized carotid arteries and left vertebral artery in the mid to upper neck with nondiagnostic evaluation for stenosis. A CTA of the neck could better evaluate if clinically indicated. Electronically Signed   By: Feliberto Harts MD   On: 01/17/2021 12:42   MR BRAIN WO CONTRAST  Result Date: 01/17/2021 CLINICAL DATA:  Neuro deficit, acute, stroke suspected EXAM: MRI HEAD WITHOUT CONTRAST MRA HEAD WITHOUT CONTRAST MRA NECK WITHOUT CONTRAST TECHNIQUE: Multiplanar, multiecho pulse sequences of  the brain and surrounding structures were obtained without intravenous contrast. Angiographic images of the Circle of Willis were obtained using MRA technique without intravenous contrast. Angiographic images of the neck were obtained using MRA technique without intravenous contrast. Carotid stenosis measurements (when applicable) are obtained utilizing NASCET criteria, using the distal internal carotid diameter as the denominator. COMPARISON:  None. FINDINGS: MRI HEAD FINDINGS Brain: Acute bilateral occipital lobe (larger on the left ) and left posterior temporal and parietal lobe infarcts. Associated edema with mild regional mass effect. No midline shift. Remote lacunar infarcts in bilateral basal ganglia with suspected prior hemorrhage in the left basal ganglia given associated susceptibility artifact. Additional remote infarcts in bilateral cerebellar hemispheres, larger on the left. Otherwise, mild scattered T2 hyperintensities in the white matter, likely related to chronic microvascular ischemic disease. No evidence of acute hemorrhage. No mass lesion or extra-axial fluid collection. No hydrocephalus. Vascular: See below. Skull and upper cervical spine: Normal marrow signal. Sinuses/Orbits: Mild ethmoid air cell mucosal thickening. Unremarkable orbits. Other: No mastoid effusions. MRA HEAD  FINDINGS Motion limited evaluation. Anterior circulation: Bilateral intracranial ICAs, MCAs, and ACAs are patent. Motion limited evaluation without evidence of a high-grade proximal stenosis. Suspected severe stenosis of the distal left ACA (A3/A4). Posterior circulation: Severe stenosis versus occlusion of the distal right intradural vertebral artery. Left intradural vertebral artery is patent proximally with atherosclerotic narrowing. Severe stenosis at the left vertebrobasilar junction. Moderate to severe stenosis and irregularity of the proximal basilar artery. Bilateral PCAs are patent. Severe stenosis of the right P2  PCA. Multifocal mild-to-moderate stenosis of the left P2 PCA. No aneurysm identified. MRA NECK FINDINGS Severely limited study. Nondiagnostic evaluation lower neck with nonvisualization of the arteries. There appears to be antegrade flow in the common carotid and internal carotid arteries and the left vertebral artery in the mid upper neck. Likely left dominant vertebral artery. Intermittent poor flow related signal in the right vertebral artery, suggesting potential hemodynamically significant stenosis in the neck. IMPRESSION: MRI: 1. Acute bilateral occipital lobe (larger on the left ) and left posterior temporal and parietal lobe infarcts (PCA territories). Associated edema with mild regional mass effect. No midline shift. 2. Remote infarcts in bilateral cerebellar hemispheres and bilateral basal ganglia with suspected prior hemorrhage in the left basal ganglia. MRA Head: 1. Severe stenosis versus occlusion of the distal right intradural vertebral artery. 2. Severe stenosis at the left vertebrobasilar junction with moderate to severe stenosis and irregularity of the proximal basilar artery. 3. Severe right P2 PCA stenosis. Multifocal mild-to-moderate stenosis of the left P2 PCA. 4. Suspected severe stenosis of the distal left ACA (A3/A4). 5. Right PICA is patent proximally with poor flow related signal distally. MRA Neck: Severely limited study with nonvisualization of the arteries in the lower neck. Intermittent poor flow related signal of the visualized right vertebral artery, suggesting potential hemodynamically significant stenosis. Antegrade flow in the visualized carotid arteries and left vertebral artery in the mid to upper neck with nondiagnostic evaluation for stenosis. A CTA of the neck could better evaluate if clinically indicated. Electronically Signed   By: Feliberto Harts MD   On: 01/17/2021 12:42   ECHOCARDIOGRAM COMPLETE  Result Date: 01/18/2021    ECHOCARDIOGRAM REPORT   Patient Name:    Clinton Knight Date of Exam: 01/18/2021 Medical Rec #:  [id number]     Height:       66.0 in Accession #:    [id number]    Weight:       175.0 lb Date of Birth:  04-May-1961    BSA:          1.889 m Patient Age:    59 years      BP:           139/80 mmHg Patient Gender: M             HR:           51 bpm. Exam Location:  [facility] Procedure: 2D Echo, Cardiac Doppler and Color Doppler Indications:     Stroke I63.9  History:         Patient has prior history of Echocardiogram examinations, most                  recent 03/20/2018. Stroke; Risk Factors:Hypertension.  Sonographer:     Cristela Blue RDCS (AE) Referring Phys:  [id number] Lucile Shutters Diagnosing Phys: Debbe Odea MD IMPRESSIONS  1. Left ventricular ejection fraction, by estimation, is 60 to 65%. The left ventricle has normal function. The left ventricle has no regional wall  motion abnormalities. There is mild left ventricular hypertrophy. Left ventricular diastolic parameters were normal. The average left ventricular global longitudinal strain is -19.2 %. The global longitudinal strain is normal.  2. Right ventricular systolic function is normal. The right ventricular size is normal.  3. The mitral valve is normal in structure. No evidence of mitral valve regurgitation.  4. The aortic valve is tricuspid. Aortic valve regurgitation is not visualized. Mild aortic valve sclerosis is present, with no evidence of aortic valve stenosis.  5. The inferior vena cava is normal in size with greater than 50% respiratory variability, suggesting right atrial pressure of 3 mmHg. FINDINGS  Left Ventricle: Left ventricular ejection fraction, by estimation, is 60 to 65%. The left ventricle has normal function. The left ventricle has no regional wall motion abnormalities. The average left ventricular global longitudinal strain is -19.2 %. The global longitudinal strain is normal. 3D left ventricular ejection fraction analysis performed but not reported based on interpreter judgement  due to suboptimal quality. The left ventricular internal cavity size was normal in size. There is mild left  ventricular hypertrophy. Left ventricular diastolic parameters were normal. Right Ventricle: The right ventricular size is normal. No increase in right ventricular wall thickness. Right ventricular systolic function is normal. Left Atrium: Left atrial size was normal in size. Right Atrium: Right atrial size was normal in size. Pericardium: There is no evidence of pericardial effusion. Mitral Valve: The mitral valve is normal in structure. No evidence of mitral valve regurgitation. Tricuspid Valve: The tricuspid valve is normal in structure. Tricuspid valve regurgitation is not demonstrated. Aortic Valve: The aortic valve is tricuspid. Aortic valve regurgitation is not visualized. Mild aortic valve sclerosis is present, with no evidence of aortic valve stenosis. Aortic valve mean gradient measures 5.0 mmHg. Aortic valve peak gradient measures 11.7 mmHg. Aortic valve area, by VTI measures 2.73 cm. Pulmonic Valve: The pulmonic valve was not well visualized. Pulmonic valve regurgitation is trivial. Aorta: The aortic root is normal in size and structure. Venous: The inferior vena cava is normal in size with greater than 50% respiratory variability, suggesting right atrial pressure of 3 mmHg. IAS/Shunts: No atrial level shunt detected by color flow Doppler.  LEFT VENTRICLE PLAX 2D LVIDd:         4.98 cm  Diastology LVIDs:         2.46 cm  LV e' medial:    8.38 cm/s LV PW:         1.11 cm  LV E/e' medial:  10.9 LV IVS:        0.87 cm  LV e' lateral:   10.00 cm/s LVOT diam:     2.10 cm  LV E/e' lateral: 9.2 LV SV:         74 LV SV Index:   39       2D Longitudinal Strain LVOT Area:     3.46 cm 2D Strain GLS Avg:     -19.2 %                          3D Volume EF:                         3D EF:        60 %                         LV EDV:  154 ml                         LV ESV:       62 ml                          LV SV:        92 ml RIGHT VENTRICLE RV Basal diam:  3.09 cm RV S prime:     21.80 cm/s TAPSE (M-mode): 4.7 cm LEFT ATRIUM             Index       RIGHT ATRIUM           Index LA diam:        3.70 cm 1.96 cm/m  RA Area:     14.00 cm LA Vol (A2C):   69.0 ml 36.52 ml/m RA Volume:   33.50 ml  17.73 ml/m LA Vol (A4C):   43.6 ml 23.08 ml/m LA Biplane Vol: 54.6 ml 28.90 ml/m  AORTIC VALVE                    PULMONIC VALVE AV Area (Vmax):    2.25 cm     PV Vmax:        0.72 m/s AV Area (Vmean):   2.39 cm     PV Peak grad:   2.1 mmHg AV Area (VTI):     2.73 cm     RVOT Peak grad: 6 mmHg AV Vmax:           171.00 cm/s AV Vmean:          101.000 cm/s AV VTI:            0.272 m AV Peak Grad:      11.7 mmHg AV Mean Grad:      5.0 mmHg LVOT Vmax:         111.00 cm/s LVOT Vmean:        69.800 cm/s LVOT VTI:          0.214 m LVOT/AV VTI ratio: 0.79  AORTA Ao Root diam: 3.27 cm MITRAL VALVE               TRICUSPID VALVE MV Area (PHT): 3.10 cm    TR Peak grad:   16.0 mmHg MV Decel Time: 245 msec    TR Vmax:        200.00 cm/s MV E velocity: 91.70 cm/s                            SHUNTS                            Systemic VTI:  0.21 m                            Systemic Diam: 2.10 cm Debbe Odea MD Electronically signed by Debbe Odea MD Signature Date/Time: 01/18/2021/3:07:30 PM    Final      PERTINENT LAB RESULTS: CBC: Recent Labs    01/17/21 0504  WBC 11.0*  HGB 13.1  HCT 40.8  PLT 256   CMET CMP     Component Value Date/Time   NA 140 01/17/2021 0504   NA 140 04/11/2018 1141   NA 138 01/29/2014 0536   K  3.9 01/17/2021 0504   K 4.4 01/29/2014 0536   CL 110 01/17/2021 0504   CL 106 01/29/2014 0536   CO2 25 01/17/2021 0504   CO2 22 01/29/2014 0536   GLUCOSE 107 (H) 01/17/2021 0504   GLUCOSE 80 01/29/2014 0536   BUN 13 01/17/2021 0504   BUN 15 04/11/2018 1141   BUN 11 01/29/2014 0536   CREATININE 0.91 01/17/2021 0504   CREATININE 1.00 01/29/2014 0536   CALCIUM 8.6 (L) 01/17/2021 0504    CALCIUM 8.7 01/29/2014 0536   PROT 6.9 01/17/2021 0504   PROT 7.7 01/29/2014 0536   ALBUMIN 3.6 01/17/2021 0504   ALBUMIN 3.8 01/29/2014 0536   AST 22 01/17/2021 0504   AST 44 (H) 01/29/2014 0536   ALT 20 01/17/2021 0504   ALT 38 01/29/2014 0536   ALKPHOS 93 01/17/2021 0504   ALKPHOS 102 01/29/2014 0536   BILITOT 0.5 01/17/2021 0504   BILITOT 0.5 01/29/2014 0536   GFRNONAA >60 01/17/2021 0504   GFRNONAA >60 01/29/2014 0536   GFRAA >60 05/07/2018 0406   GFRAA >60 01/29/2014 0536    GFR CrCl cannot be calculated (Unknown ideal weight.). No results for input(s): LIPASE, AMYLASE in the last 72 hours. No results for input(s): CKTOTAL, CKMB, CKMBINDEX, TROPONINI in the last 72 hours. Invalid input(s): POCBNP No results for input(s): DDIMER in the last 72 hours. Recent Labs    01/17/21 0504 01/18/21 0723  HGBA1C 6.3* 6.3*   Recent Labs    01/17/21 0504 01/18/21 0723  CHOL 188 141  HDL 45 38*  LDLCALC 96 89  TRIG 237* 69  CHOLHDL 4.2 3.7   No results for input(s): TSH, T4TOTAL, T3FREE, THYROIDAB in the last 72 hours.  Invalid input(s): FREET3 No results for input(s): VITAMINB12, FOLATE, FERRITIN, TIBC, IRON, RETICCTPCT in the last 72 hours. Coags: No results for input(s): INR in the last 72 hours.  Invalid input(s): PT Microbiology: Recent Results (from the past 240 hour(s))  Resp Panel by RT-PCR (Flu A&B, Covid) Nasopharyngeal Swab     Status: None   Collection Time: 01/17/21  9:35 AM   Specimen: Nasopharyngeal Swab; Nasopharyngeal(NP) swabs in vial transport medium  Result Value Ref Range Status   SARS Coronavirus 2 by RT PCR NEGATIVE NEGATIVE Final    Comment: (NOTE) SARS-CoV-2 target nucleic acids are NOT DETECTED.  The SARS-CoV-2 RNA is generally detectable in upper respiratory specimens during the acute phase of infection. The lowest concentration of SARS-CoV-2 viral copies this assay can detect is 138 copies/mL. A negative result does not preclude  SARS-Cov-2 infection and should not be used as the sole basis for treatment or other patient management decisions. A negative result may occur with  improper specimen collection/handling, submission of specimen other than nasopharyngeal swab, presence of viral mutation(s) within the areas targeted by this assay, and inadequate number of viral copies(<138 copies/mL). A negative result must be combined with clinical observations, patient history, and epidemiological information. The expected result is Negative.  Fact Sheet for Patients:  BloggerCourse.com  Fact Sheet for Healthcare Providers:  SeriousBroker.it  This test is no t yet approved or cleared by the [place] FDA and  has been authorized for detection and/or diagnosis of SARS-CoV-2 by FDA under an Emergency Use Authorization (EUA). This EUA will remain  in effect (meaning this test can be used) for the duration of the COVID-19 declaration under Section 564(b)(1) of the Act, 21 U.S.C.section 360bbb-3(b)(1), unless the authorization is terminated  or revoked sooner.  Influenza A by PCR NEGATIVE NEGATIVE Final   Influenza B by PCR NEGATIVE NEGATIVE Final    Comment: (NOTE) The Xpert Xpress SARS-CoV-2/FLU/RSV plus assay is intended as an aid in the diagnosis of influenza from Nasopharyngeal swab specimens and should not be used as a sole basis for treatment. Nasal washings and aspirates are unacceptable for Xpert Xpress SARS-CoV-2/FLU/RSV testing.  Fact Sheet for Patients: BloggerCourse.com  Fact Sheet for Healthcare Providers: SeriousBroker.it  This test is not yet approved or cleared by the [place] FDA and has been authorized for detection and/or diagnosis of SARS-CoV-2 by FDA under an Emergency Use Authorization (EUA). This EUA will remain in effect (meaning this test can be used) for the duration of  the COVID-19 declaration under Section 564(b)(1) of the Act, 21 U.S.C. section 360bbb-3(b)(1), unless the authorization is terminated or revoked.  Performed at [facility], [address] [place], [place] [address]     FURTHER DISCHARGE INSTRUCTIONS:  Get Medicines reviewed and adjusted: Please take all your medications with you for your next visit with your Primary MD  Laboratory/radiological data: Please request your Primary MD to go over all hospital tests and procedure/radiological results at the follow up, please ask your Primary MD to get all Hospital records sent to his/her office.  In some cases, they will be blood work, cultures and biopsy results pending at the time of your discharge. Please request that your primary care M.D. goes through all the records of your hospital data and follows up on these results.  Also Note the following: If you experience worsening of your admission symptoms, develop shortness of breath, life threatening emergency, suicidal or homicidal thoughts you must seek medical attention immediately by calling 911 or calling your MD immediately  if symptoms less severe.  You must read complete instructions/literature along with all the possible adverse reactions/side effects for all the Medicines you take and that have been prescribed to you. Take any new Medicines after you have completely understood and accpet all the possible adverse reactions/side effects.   Do not drive when taking Pain medications or sleeping medications (Benzodaizepines)  Do not take more than prescribed Pain, Sleep and Anxiety Medications. It is not advisable to combine anxiety,sleep and pain medications without talking with your primary care practitioner  Special Instructions: If you have smoked or chewed Tobacco  in the last 2 yrs please stop smoking, stop any regular Alcohol  and or any Recreational drug use.  Wear Seat belts while driving.  Please note: You were  cared for by a hospitalist during your hospital stay. Once you are discharged, your primary care physician will handle any further medical issues. Please note that NO REFILLS for any discharge medications will be authorized once you are discharged, as it is imperative that you return to your primary care physician (or establish a relationship with a primary care physician if you do not have one) for your post hospital discharge needs so that they can reassess your need for medications and monitor your lab values.  Total Time spent coordinating discharge including counseling, education and face to face time equals 35 minutes.  SignedJeoffrey Massed 01/19/2021 10:55 AM

## 2021-01-19 NOTE — Progress Notes (Signed)
Patient is being discharged home to self care. Ivs have been removed. Discharge instructions given and patient educated about those instructions. Waiting for patients transportation home

## 2021-02-17 ENCOUNTER — Emergency Department: Payer: Self-pay

## 2021-02-17 ENCOUNTER — Emergency Department
Admission: EM | Admit: 2021-02-17 | Discharge: 2021-02-17 | Disposition: A | Discharge status: Home / Self Care | Payer: Self-pay | Attending: Emergency Medicine | Admitting: Emergency Medicine

## 2021-02-17 ENCOUNTER — Encounter: Payer: Self-pay | Admitting: Emergency Medicine

## 2021-02-17 ENCOUNTER — Other Ambulatory Visit: Payer: Self-pay

## 2021-02-17 DIAGNOSIS — E1122 Type 2 diabetes mellitus with diabetic chronic kidney disease: Secondary | ICD-10-CM | POA: Insufficient documentation

## 2021-02-17 DIAGNOSIS — Z79899 Other long term (current) drug therapy: Secondary | ICD-10-CM | POA: Insufficient documentation

## 2021-02-17 DIAGNOSIS — R079 Chest pain, unspecified: Secondary | ICD-10-CM | POA: Insufficient documentation

## 2021-02-17 DIAGNOSIS — I129 Hypertensive chronic kidney disease with stage 1 through stage 4 chronic kidney disease, or unspecified chronic kidney disease: Secondary | ICD-10-CM | POA: Insufficient documentation

## 2021-02-17 DIAGNOSIS — N182 Chronic kidney disease, stage 2 (mild): Secondary | ICD-10-CM | POA: Insufficient documentation

## 2021-02-17 DIAGNOSIS — R519 Headache, unspecified: Secondary | ICD-10-CM | POA: Insufficient documentation

## 2021-02-17 DIAGNOSIS — Z7902 Long term (current) use of antithrombotics/antiplatelets: Secondary | ICD-10-CM | POA: Insufficient documentation

## 2021-02-17 DIAGNOSIS — F17210 Nicotine dependence, cigarettes, uncomplicated: Secondary | ICD-10-CM | POA: Insufficient documentation

## 2021-02-17 DIAGNOSIS — Z7982 Long term (current) use of aspirin: Secondary | ICD-10-CM | POA: Insufficient documentation

## 2021-02-17 LAB — CBC WITH DIFFERENTIAL/PLATELET
Abs Immature Granulocytes: 0.04 10*3/uL (ref 0.00–0.07)
Basophils Absolute: 0.1 10*3/uL (ref 0.0–0.1)
Basophils Relative: 0 %
Eosinophils Absolute: 0.2 10*3/uL (ref 0.0–0.5)
Eosinophils Relative: 2 %
HCT: 41.8 % (ref 39.0–52.0)
Hemoglobin: 13.7 g/dL (ref 13.0–17.0)
Immature Granulocytes: 0 %
Lymphocytes Relative: 31 %
Lymphs Abs: 3.6 10*3/uL (ref 0.7–4.0)
MCH: 28.5 pg (ref 26.0–34.0)
MCHC: 32.8 g/dL (ref 30.0–36.0)
MCV: 87.1 fL (ref 80.0–100.0)
Monocytes Absolute: 0.7 10*3/uL (ref 0.1–1.0)
Monocytes Relative: 6 %
Neutro Abs: 6.9 10*3/uL (ref 1.7–7.7)
Neutrophils Relative %: 61 %
Platelets: 308 10*3/uL (ref 150–400)
RBC: 4.8 MIL/uL (ref 4.22–5.81)
RDW: 13.9 % (ref 11.5–15.5)
WBC: 11.4 10*3/uL — ABNORMAL HIGH (ref 4.0–10.5)
nRBC: 0 % (ref 0.0–0.2)

## 2021-02-17 LAB — COMPREHENSIVE METABOLIC PANEL
ALT: 25 U/L (ref 0–44)
AST: 26 U/L (ref 15–41)
Albumin: 4.2 g/dL (ref 3.5–5.0)
Alkaline Phosphatase: 105 U/L (ref 38–126)
Anion gap: 10 (ref 5–15)
BUN: 17 mg/dL (ref 6–20)
CO2: 23 mmol/L (ref 22–32)
Calcium: 8.8 mg/dL — ABNORMAL LOW (ref 8.9–10.3)
Chloride: 100 mmol/L (ref 98–111)
Creatinine, Ser: 0.93 mg/dL (ref 0.61–1.24)
GFR, Estimated: >60 mL/min (ref >60)
Glucose, Bld: 93 mg/dL (ref 70–99)
Potassium: 3.6 mmol/L (ref 3.5–5.1)
Sodium: 133 mmol/L — ABNORMAL LOW (ref 135–145)
Total Bilirubin: 0.5 mg/dL (ref 0.3–1.2)
Total Protein: 7.8 g/dL (ref 6.5–8.1)

## 2021-02-17 LAB — TROPONIN I (HIGH SENSITIVITY)
Troponin I (High Sensitivity): 9 ng/L (ref <18)
Troponin I (High Sensitivity): 11 ng/L (ref <18)

## 2021-02-17 MED ORDER — DIPHENHYDRAMINE HCL 50 MG/ML IJ SOLN
25.0000 mg | Freq: Once | INTRAMUSCULAR | Status: AC
  Administered 2021-02-17: 25 mg via INTRAVENOUS
  Filled 2021-02-17: qty 1

## 2021-02-17 MED ORDER — SODIUM CHLORIDE 0.9 % IV BOLUS (SEPSIS)
1000.0000 mL | Freq: Once | INTRAVENOUS | Status: AC
  Administered 2021-02-17: 1000 mL via INTRAVENOUS

## 2021-02-17 MED ORDER — METOCLOPRAMIDE HCL 5 MG/ML IJ SOLN
10.0000 mg | Freq: Once | INTRAMUSCULAR | Status: AC
  Administered 2021-02-17: 10 mg via INTRAVENOUS
  Filled 2021-02-17: qty 2

## 2021-02-17 NOTE — ED Notes (Signed)
IV attempted x 2 without success, IV team consult ordered

## 2021-02-17 NOTE — ED Provider Notes (Addendum)
[facility] Provider Note  ____________________________________________   Event Date/Time   First MD Initiated Contact with Patient 02/17/21 0404     (approximate)  I have reviewed the triage vital signs and the nursing notes.   HISTORY  Chief Complaint Chest Pain    HPI [name] is a 60 y.o. male with history of substance abuse, hypertension, CVA in August 2022 on Plavix who presents to the emergency department with complaints of diffuse throbbing headache.  States he has had similar headaches previously.  Has not tried any medications at home.  States headache has been present for the past day and he has had associated photophobia.  No numbness, tingling or weakness.  No fevers, neck pain or neck stiffness.  Denies any head injury.  States he has not taken his Plavix in the past 2 days.  Patient also complaining of intermittent right-sided chest pain that lasts for few seconds and then spontaneously resolves.  He is unable to describe it other than stating it "hurts".  States this has been ongoing for years.  No tightness or pressure.  No shortness of breath.  No fevers, cough.  No lower extremity swelling or pain.  No history of CAD.  No history of PE, DVT, exogenous estrogen use, recent fractures, surgery, trauma, hospitalization, prolonged travel or other immobilization. No lower extremity swelling or pain. No calf tenderness.         Past Medical History:  Diagnosis Date   Acute kidney insufficiency    03/2018: Cr 1.1 in setting of stroke (baseline ~0.9)   Alcohol abuse    Cocaine abuse (HCC)    03/2018: Postitive cocaine UA tox screen   Diabetes (HCC)    03/20/18: A1C 6.4   Hypertension    Hypertriglyceridemia    03/20/18 TG 511   Nicotine abuse    Stroke [facility](HCC)     Patient Active Problem List   Diagnosis Date Noted   Acute CVA (cerebrovascular accident) (HCC) 01/17/2021   Nicotine dependence 01/17/2021   Non  compliance w medication regimen 01/17/2021   Polysubstance abuse (HCC) 01/17/2021   Substance induced mood disorder (HCC) 09/10/2020   Cocaine abuse (HCC) 07/12/2018   NSTEMI (non-ST elevated myocardial infarction) (HCC) 05/03/2018   CKD (chronic kidney disease), stage II    Sleep disturbance    Diabetes mellitus type 2 in obese (HCC)    Leukocytosis    Slow transit constipation    Essential hypertension    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 Authorizing Provider  aspirin 81 MG chewable tablet Chew 1 tablet (81 mg total) by mouth once daily. 01/19/21   Ghimire, Werner LeanShanker M, MD  atorvastatin (LIPITOR) 80 MG tablet Take 1 tablet (80 mg total) by mouth once daily. 01/20/21   Ghimire, Werner LeanShanker M, MD  clopidogrel (PLAVIX) 75 MG tablet Take 1 tablet (75 mg total) by mouth once daily. 01/19/21   Ghimire, Werner LeanShanker M, MD  lisinopril (ZESTRIL) 20 MG tablet Take 1 tablet (20 mg total) by mouth once  daily. 01/19/21   Ghimire, Werner Lean, MD    Allergies Patient has no known allergies.  Family History  Problem Relation Age of Onset   Diabetes Mother    Stroke Mother    Hypertension Mother    Heart attack Brother 100       Brother, MI, 70 yo   Thyroid disease Daughter        Removed her entire thyroid    Social History Social History   Tobacco Use   Smoking status: Every Day    Packs/day: 0.25    Types: Cigarettes   Smokeless tobacco: Never   Tobacco comments:    smoking 1 cigarette everyday.   Substance Use Topics   Alcohol use: Yes   Drug use: Yes    Types: Cocaine     Review of Systems Constitutional: No fever. Eyes: No visual changes. ENT: No sore throat. Cardiovascular: Denies chest pain. Respiratory: Denies shortness of breath. Gastrointestinal: No nausea, vomiting, diarrhea. Genitourinary: Negative for dysuria. Musculoskeletal: Negative for back pain. Skin: Negative for rash. Neurological: Negative for focal weakness or numbness.  ____________________________________________   PHYSICAL EXAM:  VITAL SIGNS: ED Triage Vitals  Enc Vitals Group     BP 02/17/21 0100 (!) 152/86     Pulse Rate 02/17/21 0100 68     Resp 02/17/21 0100 18     Temp 02/17/21 0100 97.9 F (36.6 C)     Temp Source 02/17/21 0100 Oral     SpO2 02/17/21 0100 99 %     Weight 02/17/21 0056 175 lb (79.4 kg)     Height 02/17/21 0056 5\' 6"  (1.676 m)     Head Circumference --      Peak Flow --      Pain Score 02/17/21 0056 10     Pain Loc --      Pain Edu? --      Excl. in GC? --    CONSTITUTIONAL: Alert and oriented and responds appropriately to questions. Well-appearing; well-nourished, afebrile, nontoxic, keeps his face covered with a sheet during most of the history and examination HEAD: Normocephalic, atraumatic EYES: Conjunctivae clear, pupils appear equal, EOM appear intact; + photophobia ENT: normal nose; moist mucous membranes NECK: Supple, normal ROM, no meningismus CARD: RRR; S1 and S2 appreciated; no murmurs, no clicks, no rubs, no gallops RESP: Normal chest excursion without splinting or tachypnea; breath sounds clear and equal bilaterally; no wheezes, no rhonchi, no rales, no hypoxia or respiratory distress, speaking full sentences ABD/GI: Normal bowel sounds; non-distended; soft, non-tender, no rebound, no guarding, no peritoneal signs, no hepatosplenomegaly BACK: The back appears normal EXT: Normal ROM in all joints; no deformity noted, no edema; no cyanosis SKIN: Normal color for age and race; warm; no rash on exposed skin NEURO: Moves all  extremities equally, normal sensation diffusely, cranial nerves II through XII intact, normal speech PSYCH: The patient's mood and manner are appropriate.  ____________________________________________   LABS (all labs ordered are listed, but only abnormal results are displayed)  Labs Reviewed  CBC WITH DIFFERENTIAL/PLATELET - Abnormal; Notable for the following components:      Result Value   WBC 11.4 (*)    All other components within normal limits  COMPREHENSIVE METABOLIC PANEL - Abnormal; Notable for the following components:   Sodium 133 (*)    Calcium 8.8 (*)    All other components within normal limits  TROPONIN I (HIGH SENSITIVITY)  TROPONIN I (HIGH SENSITIVITY)   ____________________________________________  EKG   EKG  Interpretation  Date/Time:  Thursday February 17 2021 01:00:44 EDT Ventricular Rate:  63 PR Interval:  162 QRS Duration: 84 QT Interval:  420 QTC Calculation: 429 R Axis:   87 Text Interpretation: Normal sinus rhythm Normal ECG Confirmed by Rochele Raring [phone]) on 02/17/2021 4:15:11 AM        ____________________________________________  RADIOLOGY Normajean Baxter Karolyn Messing, personally viewed and evaluated these images (plain radiographs) as part of my medical decision making, as well as reviewing the written report by the radiologist.  ED MD interpretation: Chest x-ray clear.  CT head shows no acute abnormality.  Official radiology report(s): DG Chest 2 View  Result Date: 02/17/2021 CLINICAL DATA:  Chest pain, anxiety EXAM: CHEST - 2 VIEW COMPARISON:  01/17/2021 FINDINGS: Lungs are well expanded, symmetric, and clear. No pneumothorax or pleural effusion. Cardiac size within normal limits. Pulmonary vascularity is normal. Osseous structures are age-appropriate. No acute bone abnormality. IMPRESSION: No active cardiopulmonary disease. Electronically Signed   By: Helyn Numbers M.D.   On: 02/17/2021 01:27   CT HEAD WO CONTRAST ( )  Result Date:  02/17/2021 CLINICAL DATA:  60 year old male with headache with photophobia. On Plavix. Status post left greater than right PCA territory infarcts last month. EXAM: CT HEAD WITHOUT CONTRAST TECHNIQUE: Contiguous axial images were obtained from the base of the skull through the vertex without intravenous contrast. COMPARISON:  Brain MRI, MRA, and head CT 08/08/2022p. FINDINGS: Brain: Expected evolution of left greater than right PCA territory infarcts, with regressed cytotoxic edema from last month. Patchy associated developing encephalomalacia. Chronic small cerebellar infarcts redemonstrated. Chronic basal ganglia infarcts are stable. No midline shift, ventriculomegaly, mass effect, evidence of mass lesion, intracranial hemorrhage or evidence of cortically based acute infarction. Vascular: Calcified atherosclerosis at the skull base. No suspicious intracranial vascular hyperdensity. Skull: No acute osseous abnormality identified. Sinuses/Orbits: Visualized paranasal sinuses and mastoids are stable and well aerated. Other: Mildly Disconjugate gaze. No other acute orbit or scalp soft tissue finding. IMPRESSION: 1. Expected evolution of the bilateral PCA territory infarcts since last month. Underlying advanced chronic small vessel disease. 2.  No acute intracranial abnormality. Electronically Signed   By: Odessa Fleming M.D.   On: 02/17/2021 07:51    ____________________________________________   PROCEDURES  Procedure(s) performed (including Critical Care):  Procedures   ____________________________________________   INITIAL IMPRESSION / ASSESSMENT AND PLAN / ED COURSE  As part of my medical decision making, I reviewed the following data within the electronic MEDICAL RECORD NUMBER Nursing notes reviewed and incorporated, Labs reviewed , EKG interpreted , Old EKG reviewed, Old chart reviewed, Radiograph reviewed , and Notes from prior ED visits         Patient here with complaints of headache.  He states he  has had similar headaches before but is very difficult to get a clear history from him.  It looks like the last time he was here with headache he was diagnosed with a stroke.  He is supposed to be on Plavix but has been out of this medication for a couple of days now.  He has not having any neurologic deficits.  No fevers or meningismus.  I have recommended a head CT ordered to rule out intracranial hemorrhage given I feel like I cannot get a great history from him.  Will give Reglan, Benadryl and IV fluids for symptomatic relief.  It appears during his last admission he was found to have new low densities in the left posterior parietal and occipital lobes consistent with acute infarcts.  He is also complaining of very atypical chest pain.  He has no risk factors for PE other than age.  His EKG is nonischemic and he has had 2 negative troponins.  Chest x-ray clear without signs of pneumothorax, infiltrate or edema.  Doubt dissection.  Not having any chest pain currently.  ED PROGRESS  Patient's head CT shows no acute abnormality.  He reports his headache has improved with Reglan and Benadryl.  He has tolerated p.o. here.  His vital signs are within normal limits.  I do not feel he needs an MRI of his brain especially given he is not having any focal neurologic deficits.  Doubt CVA, intracranial hemorrhage, meningitis, CVT.  He states that he needs a refill of his Plavix.  It appears he was provided with 3 refills on 01/19/2021.  Have advised him to get this medication refilled and pick it up from the pharmacy.  You may take 1000 mg every 6 hours as needed for pain.Recommended Tylenol at home for pain control.  I feel he is safe for discharge.  At this time, I do not feel there is any life-threatening condition present. I have reviewed, interpreted and discussed all results (EKG, imaging, lab, urine as appropriate) and exam findings with patient/family. I have reviewed nursing notes and appropriate previous  records.  I feel the patient is safe to be discharged home without further emergent workup and can continue workup as an outpatient as needed. Discussed usual and customary return precautions. Patient/family verbalize understanding and are comfortable with this plan.  Outpatient follow-up has been provided as needed. All questions have been answered.  ____________________________________________   FINAL CLINICAL IMPRESSION(S) / ED DIAGNOSES  Final diagnoses:  Bad headache  Right-sided chest pain     ED Discharge Orders     None       *Please note:  Clinton Knight was evaluated in Emergency Department on 02/17/2021 for the symptoms described in the history of present illness. He was evaluated in the context of the global COVID-19 pandemic, which necessitated consideration that the patient might be at risk for infection with the SARS-CoV-2 virus that causes COVID-19. Institutional protocols and algorithms that pertain to the evaluation of patients at risk for COVID-19 are in a state of rapid change based on information released by regulatory bodies including the CDC and federal and state organizations. These policies and algorithms were followed during the patient's care in the ED.  Some ED evaluations and interventions may be delayed as a result of limited staffing during and the pandemic.*   Note:  This document was prepared using Dragon voice recognition software and may include unintentional dictation errors.    Sherra Kimmons, Layla Maw, DO 02/17/21 0713    Xavian Hardcastle, Layla Maw, DO 02/17/21 [phone]

## 2021-02-17 NOTE — ED Triage Notes (Signed)
Patient ambulatory to triage with steady gait, without difficulty or distress noted; pt reports rt sided CP nonradiating accomp by generalized HA

## 2021-02-17 NOTE — Discharge Instructions (Addendum)
Your labs, head CT, chest x-ray, EKG were reassuring.  You may take Tylenol 1000 mg every 6 hours as needed for pain.  Please take all of your home medications as prescribed.  It appears you were given 3 refills of Plavix on 01/19/2021.  I recommend that you get this medication filled and pick it up from your pharmacy.

## 2021-02-25 ENCOUNTER — Emergency Department
Admission: EM | Admit: 2021-02-25 | Discharge: 2021-02-27 | Disposition: A | Discharge status: Home / Self Care | Payer: Medicaid Other | Attending: Emergency Medicine | Admitting: Emergency Medicine

## 2021-02-25 ENCOUNTER — Other Ambulatory Visit: Payer: Self-pay

## 2021-02-25 ENCOUNTER — Encounter: Payer: Self-pay | Admitting: Emergency Medicine

## 2021-02-25 DIAGNOSIS — F17210 Nicotine dependence, cigarettes, uncomplicated: Secondary | ICD-10-CM | POA: Insufficient documentation

## 2021-02-25 DIAGNOSIS — E1122 Type 2 diabetes mellitus with diabetic chronic kidney disease: Secondary | ICD-10-CM | POA: Insufficient documentation

## 2021-02-25 DIAGNOSIS — I129 Hypertensive chronic kidney disease with stage 1 through stage 4 chronic kidney disease, or unspecified chronic kidney disease: Secondary | ICD-10-CM | POA: Insufficient documentation

## 2021-02-25 DIAGNOSIS — N182 Chronic kidney disease, stage 2 (mild): Secondary | ICD-10-CM | POA: Insufficient documentation

## 2021-02-25 DIAGNOSIS — F1410 Cocaine abuse, uncomplicated: Secondary | ICD-10-CM | POA: Insufficient documentation

## 2021-02-25 DIAGNOSIS — Z7982 Long term (current) use of aspirin: Secondary | ICD-10-CM | POA: Insufficient documentation

## 2021-02-25 DIAGNOSIS — Y900 Blood alcohol level of less than 20 mg/100 ml: Secondary | ICD-10-CM | POA: Insufficient documentation

## 2021-02-25 DIAGNOSIS — Z20822 Contact with and (suspected) exposure to covid-19: Secondary | ICD-10-CM | POA: Insufficient documentation

## 2021-02-25 DIAGNOSIS — Z79899 Other long term (current) drug therapy: Secondary | ICD-10-CM | POA: Insufficient documentation

## 2021-02-25 DIAGNOSIS — Z7902 Long term (current) use of antithrombotics/antiplatelets: Secondary | ICD-10-CM | POA: Insufficient documentation

## 2021-02-25 DIAGNOSIS — F1010 Alcohol abuse, uncomplicated: Secondary | ICD-10-CM | POA: Insufficient documentation

## 2021-02-25 LAB — COMPREHENSIVE METABOLIC PANEL
ALT: 24 U/L (ref 0–44)
AST: 27 U/L (ref 15–41)
Albumin: 4.2 g/dL (ref 3.5–5.0)
Alkaline Phosphatase: 101 U/L (ref 38–126)
Anion gap: 8 (ref 5–15)
BUN: 9 mg/dL (ref 6–20)
CO2: 28 mmol/L (ref 22–32)
Calcium: 9.3 mg/dL (ref 8.9–10.3)
Chloride: 100 mmol/L (ref 98–111)
Creatinine, Ser: 0.94 mg/dL (ref 0.61–1.24)
GFR, Estimated: >60 mL/min (ref >60)
Glucose, Bld: 141 mg/dL — ABNORMAL HIGH (ref 70–99)
Potassium: 4.1 mmol/L (ref 3.5–5.1)
Sodium: 136 mmol/L (ref 135–145)
Total Bilirubin: 0.4 mg/dL (ref 0.3–1.2)
Total Protein: 7.8 g/dL (ref 6.5–8.1)

## 2021-02-25 LAB — CBC
HCT: 42.3 % (ref 39.0–52.0)
Hemoglobin: 13.3 g/dL (ref 13.0–17.0)
MCH: 27.5 pg (ref 26.0–34.0)
MCHC: 31.4 g/dL (ref 30.0–36.0)
MCV: 87.6 fL (ref 80.0–100.0)
Platelets: 245 10*3/uL (ref 150–400)
RBC: 4.83 MIL/uL (ref 4.22–5.81)
RDW: 14.1 % (ref 11.5–15.5)
WBC: 9.2 10*3/uL (ref 4.0–10.5)
nRBC: 0 % (ref 0.0–0.2)

## 2021-02-25 LAB — RESP PANEL BY RT-PCR (FLU A&B, COVID) ARPGX2
Influenza A by PCR: NEGATIVE
Influenza B by PCR: NEGATIVE
SARS Coronavirus 2 by RT PCR: NEGATIVE

## 2021-02-25 LAB — ETHANOL: Alcohol, Ethyl (B): <10 mg/dL (ref <10)

## 2021-02-25 NOTE — ED Triage Notes (Addendum)
During triage assessment, pt did admit to having SI recently states he has lost his job, lost his place to stay and "now I'm losing myself".Pt denies any plan at this time.

## 2021-02-25 NOTE — ED Notes (Signed)
Pt reminded of need for UDS when goes to restroom, pt expresses understanding

## 2021-02-25 NOTE — ED Provider Notes (Signed)
[facility] Emergency Department Provider Note   ____________________________________________   Event Date/Time   First MD Initiated Contact with Patient 02/25/21 2227     (approximate)  I have reviewed the triage vital signs and the nursing notes.   HISTORY  Chief Complaint Addiction Problem and Suicidal    HPI Clinton Knight is a 60 y.o. male who presents requesting resources for alcohol and cocaine rehabilitation.  Patient states that he has been using alcohol and cocaine every day to excess over the last week after becoming homeless 2 weeks prior to arrival.  Patient denies ever having withdrawal symptoms after stopping drinking or hallucinations/seizures.  Patient states that he usually smokes cocaine with the last use of alcohol and cocaine being last evening.  Patient also states that he has not eaten anything over the past 4 days          Past Medical History:  Diagnosis Date   Acute kidney insufficiency    03/2018: Cr 1.1 in setting of stroke (baseline ~0.9)   Alcohol abuse    Cocaine abuse (HCC)    03/2018: Postitive cocaine UA tox screen   Diabetes (HCC)    03/20/18: A1C 6.4   Hypertension    Hypertriglyceridemia    03/20/18 TG 511   Nicotine abuse    Stroke [facility])     Patient Active Problem List   Diagnosis Date Noted   Acute CVA (cerebrovascular accident) (HCC) 01/17/2021   Nicotine dependence 01/17/2021   Non compliance w medication regimen 01/17/2021   Polysubstance abuse (HCC) 01/17/2021   Substance induced mood disorder (HCC) 09/10/2020   Cocaine abuse (HCC) 07/12/2018   NSTEMI (non-ST elevated myocardial infarction) (HCC) 05/03/2018   CKD (chronic kidney disease), stage II    Sleep disturbance    Diabetes mellitus type 2 in obese (HCC)    Leukocytosis    Slow transit constipation    Essential hypertension    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 Authorizing Provider  aspirin 81 MG chewable tablet Chew 1 tablet (81 mg total) by mouth once daily. 01/19/21   Ghimire, Werner Lean, MD  atorvastatin (LIPITOR) 80 MG tablet Take 1 tablet (80 mg total) by mouth once daily. 01/20/21   Ghimire, Werner Lean, MD  clopidogrel (PLAVIX) 75 MG tablet Take 1 tablet (75 mg total) by mouth once daily. 01/19/21   Ghimire, Werner Lean, MD  lisinopril (ZESTRIL) 20 MG tablet Take 1 tablet (20 mg total) by mouth once daily. 01/19/21   Ghimire, Werner Lean, MD    Allergies Patient has no known allergies.  Family History  Problem Relation Age of Onset   Diabetes Mother    Stroke Mother    Hypertension Mother    Heart attack Brother 81       Brother, MI, 38 yo   Thyroid disease Daughter        Removed her entire thyroid    Social History Social History   Tobacco Use   Smoking status: Every Day    Packs/day:  0.25    Types: Cigarettes   Smokeless tobacco: Never   Tobacco comments:    smoking 1 cigarette everyday.   Substance Use Topics   Alcohol use: Yes   Drug use: Yes    Types: Cocaine    Review of Systems Constitutional: No fever/chills Eyes: No visual changes. ENT: No sore throat. Cardiovascular: Denies chest pain. Respiratory: Denies shortness of breath. Gastrointestinal: No abdominal pain.  No nausea, no vomiting.  No diarrhea. Genitourinary: Negative for dysuria. Musculoskeletal: Negative for acute arthralgias Skin: Negative for rash. Neurological: Negative for headaches, weakness/numbness/paresthesias in any extremity Psychiatric: Negative for suicidal ideation/homicidal  ideation   ____________________________________________   PHYSICAL EXAM:  VITAL SIGNS: ED Triage Vitals  Enc Vitals Group     BP 02/25/21 2145 106/64     Pulse Rate 02/25/21 2145 68     Resp 02/25/21 2145 16     Temp 02/25/21 2145 98.1 F (36.7 C)     Temp Source 02/25/21 2145 Oral     SpO2 02/25/21 2145 98 %     Weight 02/25/21 2138 175 lb (79.4 kg)     Height 02/25/21 2138 5\' 6"  (1.676 m)     Head Circumference --      Peak Flow --      Pain Score 02/25/21 2138 10     Pain Loc --      Pain Edu? --      Excl. in GC? --    Constitutional: Alert and oriented. Well appearing and in no acute distress. Eyes: Conjunctivae are injected. PERRL. Head: Atraumatic. Nose: No congestion/rhinnorhea. Mouth/Throat: Mucous membranes are moist. Neck: No stridor Cardiovascular: Grossly normal heart sounds.  Good peripheral circulation. Respiratory: Normal respiratory effort.  No retractions. Gastrointestinal: Soft and nontender. No distention. Musculoskeletal: No obvious deformities Neurologic:  Normal speech and language. No gross focal neurologic deficits are appreciated. Skin:  Skin is warm and dry. No rash noted. Psychiatric: Mood and affect are normal. Speech and behavior are normal.  ____________________________________________   LABS (all labs ordered are listed, but only abnormal results are displayed)  Labs Reviewed  COMPREHENSIVE METABOLIC PANEL - Abnormal; Notable for the following components:      Result Value   Glucose, Bld 141 (*)    All other components within normal limits  RESP PANEL BY RT-PCR (FLU A&B, COVID) ARPGX2  CBC  ETHANOL  URINE DRUG SCREEN, QUALITATIVE ([facility] ONLY)   PROCEDURES  Procedure(s) performed (including Critical Care):  Procedures   ____________________________________________   INITIAL IMPRESSION / ASSESSMENT AND PLAN / ED COURSE  As part of my medical decision making, I reviewed the following data within the electronic medical  record, if available:  Nursing notes reviewed and incorporated, Labs reviewed, EKG interpreted, Old chart reviewed, Radiograph reviewed and Notes from prior ED visits reviewed and incorporated        Patient is a 60 year old male who presents requesting resources for alcohol and cocaine detoxification.  I see no evidence of acute toxidrome at this time.  Patient does not show any evidence of acute alcohol withdrawal.  Order was placed for social work to aid in providing appropriate resources.  Care of this patient will be signed out to the oncoming physician at the end of my shift.  All pertinent patient information conveyed and all questions answered.  All further care and disposition decisions will be made by the oncoming physician.      ____________________________________________   FINAL CLINICAL IMPRESSION(S) / ED DIAGNOSES  Final diagnoses:  Alcohol abuse  Cocaine abuse [facility])     ED Discharge Orders     None        Note:  This document was prepared using Dragon voice recognition software and may include unintentional dictation errors.    Merwyn Katos, MD 02/25/21 [phone]

## 2021-02-25 NOTE — ED Notes (Signed)
Pt dressed out into hospital provided attire with this tech and Hailey, EDT in the rm. Pt belongings consist of grey tennis shoes, blue/green long sleeve shirt, grey t-shirt, blue jeans, grey hat, black flip phone inside a black case, grey watch, black socks, a black wallet with no money, black boxers, a cell phone charger, a purple lighter, a black comb, a white colored ear ring with a cross charm, and a tube of chap stick. Pt calm and cooperative while dressing out. Pt belongings placed into one pt belongings bag and labeled with pt name.

## 2021-02-25 NOTE — ED Notes (Signed)
Pt reports that he has been clean for 5 months but in the last several weeks he has started to use cocaine again. Pt states that when he uses cocaine he also drinks alcohol. Reports use every night, states it is several 40's when he drinks. Pt is cooperative and looking for help as he is now homeless, jobless, and believes he needs help getting back on his feet and sober up

## 2021-02-25 NOTE — ED Triage Notes (Signed)
Pt arrived via POV with reports of needing help getting off of cocaine. Pt reports last use was last night.  Pt ambulatory without difficulty.  Pt denies any SI/HI.   Pt reports using cocaine daily over the past week, pt reports he smokes cocaine. Pt also reports etoh problem as well, pt reports last night was last use. Pt states he drinks "whatever I can get my hands on".

## 2021-02-26 LAB — URINE DRUG SCREEN, QUALITATIVE (ARMC ONLY)
Amphetamines, Ur Screen: NOT DETECTED
Barbiturates, Ur Screen: NOT DETECTED
Benzodiazepine, Ur Scrn: NOT DETECTED
Cannabinoid 50 Ng, Ur ~~LOC~~: NOT DETECTED
Cocaine Metabolite,Ur ~~LOC~~: POSITIVE — AB
MDMA (Ecstasy)Ur Screen: NOT DETECTED
Methadone Scn, Ur: NOT DETECTED
Opiate, Ur Screen: NOT DETECTED
Phencyclidine (PCP) Ur S: NOT DETECTED
Tricyclic, Ur Screen: NOT DETECTED

## 2021-02-26 NOTE — ED Notes (Signed)
Pt provides urine now, sent to lab

## 2021-02-26 NOTE — BH Assessment (Addendum)
Referral information for Detox treatment faxed to;   [facility] ([phone]) Mikle Bosworth will relay information to nurse and nurse will contact TTS back  [facility] [phone]) Staff reports to call back at 8:30am to speak with Nurse Loraine Leriche

## 2021-02-26 NOTE — ED Notes (Signed)
Psych team to bedside now

## 2021-02-26 NOTE — ED Notes (Signed)
Report given to Jen RN.

## 2021-02-26 NOTE — BH Assessment (Signed)
Comprehensive Clinical Assessment (CCA) Note  02/26/2021 Clinton Knight [id number]  Chief Complaint: Patient is a 60 year old male presenting to [facility] ED voluntarily. Per triage note Pt arrived via POV with reports of needing help getting off of cocaine. Pt reports last use was last night.  Pt ambulatory without difficulty.Pt denies any SI/HI. Pt reports using cocaine daily over the past week, pt reports he smokes cocaine. Pt also reports etoh problem as well, pt reports last night was last use. Pt states he drinks "whatever I can get my hands on". During assessment patient appears alert and oriented x4, calm and cooperative. Patient reports that he has been using Cocaine and Alcohol daily. Patient reports using Cocaine via smoking and reports $20-$40 worth. Patient reports drinking 2-4 40oz beers daily. Patient reports his longest clean time "1 year but that was a couple of years ago." Patient denies any current withdrawal symptoms and denies any history of seizures. Patient denies SI/HI/AH/VH and does not appear to be responding to any internal or external stimuli. Chief Complaint  Patient presents with   Addiction Problem   Suicidal   Visit Diagnosis: Cocaine Abuse, Alcohol Abuse   CCA Screening, Triage and Referral (STR)  Patient Reported Information How did you hear about [place]? Self  Referral name: Self  Referral phone number: No data recorded  Whom do you see for routine medical problems? I don't have a doctor  Practice/Facility Name: No data recorded Practice/Facility Phone Number: No data recorded Name of Contact: No data recorded Contact Number: No data recorded Contact Fax Number: No data recorded Prescriber Name: No data recorded Prescriber Address (if known): No data recorded  What Is the Reason for Your Visit/Call Today? Patient presents voluntarily requesting detox treatment  How Long Has This Been Causing You Problems? > than 6 months  What Do You Feel Would Help You  the Most Today? Alcohol or Drug Use Treatment   Have You Recently Been in Any Inpatient Treatment (Hospital/Detox/Crisis Center/28-Day Program)? No  Name/Location of Program/Hospital:No data recorded How Long Were You There? No data recorded When Were You Discharged? No data recorded  Have You Ever Received Services From [facility] Before? Yes  Who Do You See at [facility]? ED   Have You Recently Had Any Thoughts About Hurting Yourself? No  Are You Planning to Commit Suicide/Harm Yourself At This time? No   Have you Recently Had Thoughts About Hurting Someone Karolee Ohs? No  Explanation: No data recorded  Have You Used Any Alcohol or Drugs in the Past 24 Hours? Yes  How Long Ago Did You Use Drugs or Alcohol? No data recorded What Did You Use and How Much? Cocaine, Alcohol   Do You Currently Have a Therapist/Psychiatrist? No  Name of Therapist/Psychiatrist: No data recorded  Have You Been Recently Discharged From Any Office Practice or Programs? No  Explanation of Discharge From Practice/Program: No data recorded    CCA Screening Triage Referral Assessment Type of Contact: Face-to-Face  Is this Initial or Reassessment? No data recorded Date Telepsych consult ordered in [facility]:  No data recorded Time Telepsych consult ordered in [facility]:  No data recorded  Patient Reported Information Reviewed? Yes  Patient Left Without Being Seen? No data recorded Reason for Not Completing Assessment: No data recorded  Collateral Involvement: None provided   Does Patient Have a Court Appointed Legal Guardian? No data recorded Name and Contact of Legal Guardian: No data recorded If Minor and Not Living with Parent(s), Who has  Custody? n/a  Is CPS involved or ever been involved? Never  Is APS involved or ever been involved? Never   Patient Determined To Be At Risk for Harm To Self or Others Based on Review of Patient Reported Information or Presenting Complaint? No  Method: No data  recorded Availability of Means: No data recorded Intent: No data recorded Notification Required: No data recorded Additional Information for Danger to Others Potential: No data recorded Additional Comments for Danger to Others Potential: No data recorded Are There Guns or Other Weapons in Your Home? No data recorded Types of Guns/Weapons: No data recorded Are These Weapons Safely Secured?                            No data recorded Who Could Verify You Are Able To Have These Secured: No data recorded Do You Have any Outstanding Charges, Pending Court Dates, Parole/Probation? No data recorded Contacted To Inform of Risk of Harm To Self or Others: No data recorded  [facility] of Assessment[facility] ED   Does Patient Present under Involuntary Commitment? No  IVC Papers Initial File Date: No data recorded  [place] of Residence: [place]   Patient Currently Receiving the Following Services: Not Receiving Services   Determination of Need: Emergent (2 hours)   Options For Referral: Medication Management; Outpatient Therapy     CCA Biopsychosocial Intake/Chief Complaint:  No data recorded Current Symptoms/Problems: No data recorded  Patient Reported Schizophrenia/Schizoaffective Diagnosis in Past: No   Strengths: Patient is able to communicate his needs  Preferences: No data recorded Abilities: No data recorded  Type of Services Patient Feels are Needed: No data recorded  Initial Clinical Notes/Concerns: No data recorded  Mental Health Symptoms Depression:   Change in energy/activity; Worthlessness; Increase/decrease in appetite   Duration of Depressive symptoms:  Greater than two weeks   Mania:   None   Anxiety:    None   Psychosis:   None   Duration of Psychotic symptoms: No data recorded  Trauma:   None   Obsessions:   None   Compulsions:   None   Inattention:   None   Hyperactivity/Impulsivity:   None   Oppositional/Defiant Behaviors:   None    Emotional Irregularity:   None   Other Mood/Personality Symptoms:  No data recorded   Mental Status Exam Appearance and self-care  Stature:   Average   Weight:   Average weight   Clothing:   Casual   Grooming:   Normal   Cosmetic use:   None   Posture/gait:   Normal   Motor activity:   Not Remarkable   Sensorium  Attention:   Normal   Concentration:   Normal   Orientation:   X5   Recall/memory:   Normal   Affect and Mood  Affect:   Depressed   Mood:   Depressed   Relating  Eye contact:   Normal   Facial expression:   Responsive   Attitude toward examiner:   Cooperative   Thought and Language  Speech flow:  Clear and Coherent   Thought content:   Appropriate to Mood and Circumstances   Preoccupation:   None   Hallucinations:   None   Organization:  No data recorded  [employer] of Knowledge:   Fair   Intelligence:   Average   Abstraction:   Normal   Judgement:   Fair   Programmer, systems  Insight:   Fair   Decision Making:   Normal   Social Functioning  Social Maturity:   Isolates   Social Judgement:   Normal   Stress  Stressors:   Housing; Office manager Ability:   Exhausted   Skill Deficits:   None   Supports:   Support needed     Religion: Religion/Spirituality Are You A Religious Person?: No  Leisure/Recreation: Leisure / Recreation Do You Have Hobbies?: No  Exercise/Diet: Exercise/Diet Do You Exercise?: No Have You Gained or Lost A Significant Amount of Weight in the Past Six Months?: No Do You Follow a Special Diet?: No Do You Have Any Trouble Sleeping?: No   CCA Employment/Education Employment/Work Situation: Employment / Work Situation Employment Situation: Unemployed Patient's Job has Been Impacted by Current Illness: No Has Patient ever Been in Equities trader?: No  Education: Education Is Patient Currently Attending School?: No Did You  Have An Individualized Education Program (IIEP): No Did You Have Any Difficulty At [employer]?: No Patient's Education Has Been Impacted by Current Illness: No   CCA Family/Childhood History Family and Relationship History: Family history Marital status: Single Does patient have children?: Yes How many children?: 2 How is patient's relationship with their children?: Unknown  Childhood History:  Childhood History Did patient suffer any verbal/emotional/physical/sexual abuse as a child?: No Did patient suffer from severe childhood neglect?: No Has patient ever been sexually abused/assaulted/raped as an adolescent or adult?: No Was the patient ever a victim of a crime or a disaster?: No Witnessed domestic violence?: No Has patient been affected by domestic violence as an adult?: No  Child/Adolescent Assessment:     CCA Substance Use Alcohol/Drug Use: Alcohol / Drug Use Pain Medications: See MAR Prescriptions: See MAR Over the Counter: See MAR History of alcohol / drug use?: Yes Substance #1 Name of Substance 1: Cocaine 1 - Amount (size/oz): $20-$40 worth 1 - Frequency: daily 1 - Last Use / Amount: 02/25/21 1- Route of Use: Smoking Substance #2 Name of Substance 2: Alcohol 2 - Amount (size/oz): 2-4 40oz beers 2 - Frequency: daily 2 - Last Use / Amount: 02/25/21 2 - Route of Substance Use: Oral                     ASAM's:  Six Dimensions of Multidimensional Assessment  Dimension 1:  Acute Intoxication and/or Withdrawal Potential:      Dimension 2:  Biomedical Conditions and Complications:      Dimension 3:  Emotional, Behavioral, or Cognitive Conditions and Complications:     Dimension 4:  Readiness to Change:     Dimension 5:  Relapse, Continued use, or Continued Problem Potential:     Dimension 6:  Recovery/Living Environment:     ASAM Severity Score:    ASAM Recommended Level of Treatment:     Substance use Disorder (SUD) Substance Use Disorder (SUD)   Checklist Symptoms of Substance Use: Continued use despite having a persistent/recurrent physical/psychological problem caused/exacerbated by use, Continued use despite persistent or recurrent social, interpersonal problems, caused or exacerbated by use, Persistent desire or unsuccessful efforts to cut down or control use, Recurrent use that results in a failure to fulfill major role obligations (work, school, home), Presence of craving or strong urge to use, Social, occupational, recreational activities given up or reduced due to use  Recommendations for Services/Supports/Treatments: Recommendations for Services/Supports/Treatments Recommendations For Services/Supports/Treatments: Detox  DSM5 Diagnoses: Patient Active Problem List   Diagnosis Date Noted   Acute  CVA (cerebrovascular accident) (HCC) 01/17/2021   Nicotine dependence 01/17/2021   Non compliance w medication regimen 01/17/2021   Polysubstance abuse (HCC) 01/17/2021   Substance induced mood disorder (HCC) 09/10/2020   Cocaine abuse (HCC) 07/12/2018   NSTEMI (non-ST elevated myocardial infarction) (HCC) 05/03/2018   CKD (chronic kidney disease), stage II    Sleep disturbance    Diabetes mellitus type 2 in obese (HCC)    Leukocytosis    Slow transit constipation    Essential hypertension    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

## 2021-02-26 NOTE — ED Notes (Signed)
Report received from Jennifer, RN including SBAR. Patient alert and oriented, warm and dry, and in no acute distress. Patient denies SI, HI, AVH and pain. Patient made aware of Q15 minute rounds and Rover and Officer presence for their safety. Patient instructed to come to this nurse with needs or concerns. 

## 2021-02-26 NOTE — ED Notes (Signed)
Pt asleep at this time, unable to collect vitals. Will collect pt vitals once awake. 

## 2021-02-26 NOTE — ED Notes (Signed)
Pt. Was offered a sandwich tray and a drink, pt got both snack items.

## 2021-02-26 NOTE — ED Notes (Signed)
Noted that PT does not have home meds ordered. Spoke to Dr. Scotty Court about ordering medications for patient, states that he will place orders soon

## 2021-02-27 MED ORDER — CLOPIDOGREL BISULFATE 75 MG PO TABS
75.0000 mg | ORAL_TABLET | Freq: Every day | ORAL | Status: DC
  Administered 2021-02-27: 75 mg via ORAL
  Filled 2021-02-27 (×2): qty 1

## 2021-02-27 MED ORDER — ASPIRIN EC 81 MG PO TBEC
81.0000 mg | DELAYED_RELEASE_TABLET | Freq: Every day | ORAL | Status: DC
  Administered 2021-02-27: 81 mg via ORAL
  Filled 2021-02-27: qty 1

## 2021-02-27 MED ORDER — ATORVASTATIN CALCIUM 20 MG PO TABS
80.0000 mg | ORAL_TABLET | Freq: Every day | ORAL | Status: DC
  Administered 2021-02-27: 80 mg via ORAL
  Filled 2021-02-27: qty 4

## 2021-02-27 MED ORDER — LISINOPRIL 20 MG PO TABS
20.0000 mg | ORAL_TABLET | Freq: Every day | ORAL | Status: DC
  Administered 2021-02-27: 20 mg via ORAL
  Filled 2021-02-27: qty 1

## 2021-02-27 NOTE — ED Notes (Signed)
Lunch meal tray given.  

## 2021-02-27 NOTE — ED Notes (Signed)
Report to include Situation, Background, Assessment, and Recommendations received from [facility]. Patient alert and oriented, warm and dry, in no acute distress. Patient denies SI, HI, AVH and pain. Patient made aware of Q15 minute rounds and security cameras for their safety. Patient instructed to come to me with needs or concerns.

## 2021-02-27 NOTE — ED Notes (Signed)
EDP Siadecki at bedside. 

## 2021-02-27 NOTE — ED Provider Notes (Signed)
Emergency Medicine Observation Re-evaluation Note  Clinton Knight is a 60 y.o. male, seen on rounds today.  Pt initially presented to the ED for complaints of Addiction Problem and Suicidal Currently, the patient is resting comfortably watching TV and has no acute complaints.  Physical Exam  BP 128/72 (BP Location: Left Arm)   Pulse (!) 46   Temp 98.2 F (36.8 C) (Oral)   Resp 16   Ht 5\' 6"  (1.676 m)   Wt 79.4 kg   SpO2 97%   BMI 28.25 kg/m  Physical Exam General: Alert, comfortable appearing. Cardiac: Good peripheral perfusion. Lungs: Normal respiratory effort. Psych: Calm and cooperative.  ED Course / MDM  EKG:   I have reviewed the labs performed to date as well as medications administered while in observation.  There have been no significant changes since last assessment.  Plan  Current plan is for detox placement.  Clinton Knight is not under involuntary commitment.     , MD 02/27/21 1506

## 2021-02-27 NOTE — ED Notes (Signed)
Belongings returned to pt at this time, no needs expressed. Pt ambulated to safe transport vehicle.

## 2021-02-27 NOTE — BH Assessment (Signed)
Referral information for Detox treatment faxed to;    [facility] ([phone]), patient has a bed available with them.  ER Staff is aware of it:  Ronnie, ER Durenda Guthrie, Patient's Nurse   Address: [facility] [address] [place], [place] [address]

## 2021-02-27 NOTE — ED Notes (Signed)
Pt to [facility] at this time, report to [place] , Charity fundraiser

## 2021-02-27 NOTE — BH Assessment (Signed)
Referral information for Detox treatment faxed to;    [facility] ([phone]), pending review.   [facility] ([phone]), No beds. Advised to try tomorrow (02/28/2021).

## 2021-02-27 NOTE — ED Notes (Addendum)
Snack given at this time  

## 2021-02-27 NOTE — ED Notes (Signed)
VS not taken, patient asleep 

## 2021-02-27 NOTE — ED Notes (Signed)
Breakfast meal tray given 

## 2021-02-27 NOTE — ED Notes (Signed)
Snack given at this time  

## 2021-04-14 ENCOUNTER — Inpatient Hospital Stay: Payer: Self-pay | Admitting: Neurology

## 2021-04-14 ENCOUNTER — Encounter: Payer: Self-pay | Admitting: Neurology

## 2021-07-15 ENCOUNTER — Telehealth: Payer: Self-pay | Admitting: Pharmacist

## 2021-07-15 NOTE — Telephone Encounter (Signed)
Patient failed to provide requested 2023 financial documentation. No additional medication assistance will be provided by [facility] without the required proof of income documentation. Patient notified by letter. ? ?Clinton Knight ?Medication Management ?

## 2021-07-26 ENCOUNTER — Other Ambulatory Visit: Payer: Self-pay

## 2021-07-29 ENCOUNTER — Telehealth: Payer: Self-pay | Admitting: Pharmacy Technician

## 2021-07-29 NOTE — Telephone Encounter (Signed)
Patient failed to provide 2023 proof of income.  No additional medication assistance will be provided by [facility] without the required proof of income documentation.  Patient notified by letter.  [facility]    [address], [place]  [address]  July 15, 2021    Dear Sharee Pimple:  This is to inform you that you are no longer eligible to receive medication assistance at [facility].  The reason(s) are:    _____Your total gross monthly household income exceeds 300% of the Federal Poverty Level.   _____Tangible assets (savings, checking, stocks/bonds, pension, retirement, etc.) exceeds our limit  _____You are eligible to receive benefits from [facility], [facility] or HIV Medication              Assistance Program _____You are eligible to receive benefits from a Medicare Part D plan _____You have prescription insurance  _____You are not an [facility] resident __X__Failure to provide all requested documentation (proof of income for 2023, and/or Patient Intake Application, DOH Attestation, Contract, etc).    Medication assistance will resume once all requested documentation has been returned to our clinic.  If you have questions, please contact our clinic at [phone].    Thank you,  [facility]

## 2021-11-29 ENCOUNTER — Inpatient Hospital Stay: Payer: Medicaid Other

## 2021-11-29 ENCOUNTER — Emergency Department: Payer: Medicaid Other

## 2021-11-29 ENCOUNTER — Inpatient Hospital Stay
Admission: EM | Admit: 2021-11-29 | Discharge: 2021-12-10 | DRG: 871 | Disposition: E | Payer: Medicaid Other | Attending: Pulmonary Disease | Admitting: Pulmonary Disease

## 2021-11-29 DIAGNOSIS — I639 Cerebral infarction, unspecified: Secondary | ICD-10-CM

## 2021-11-29 DIAGNOSIS — J9601 Acute respiratory failure with hypoxia: Secondary | ICD-10-CM | POA: Diagnosis present

## 2021-11-29 DIAGNOSIS — Z529 Donor of unspecified organ or tissue: Secondary | ICD-10-CM

## 2021-11-29 DIAGNOSIS — R000 Tachycardia, unspecified: Secondary | ICD-10-CM | POA: Diagnosis present

## 2021-11-29 DIAGNOSIS — T50901A Poisoning by unspecified drugs, medicaments and biological substances, accidental (unintentional), initial encounter: Secondary | ICD-10-CM | POA: Diagnosis present

## 2021-11-29 DIAGNOSIS — G9341 Metabolic encephalopathy: Secondary | ICD-10-CM | POA: Diagnosis present

## 2021-11-29 DIAGNOSIS — Z9911 Dependence on respirator [ventilator] status: Secondary | ICD-10-CM

## 2021-11-29 DIAGNOSIS — R4189 Other symptoms and signs involving cognitive functions and awareness: Secondary | ICD-10-CM

## 2021-11-29 DIAGNOSIS — I613 Nontraumatic intracerebral hemorrhage in brain stem: Secondary | ICD-10-CM | POA: Diagnosis present

## 2021-11-29 DIAGNOSIS — F14129 Cocaine abuse with intoxication, unspecified: Secondary | ICD-10-CM | POA: Diagnosis present

## 2021-11-29 DIAGNOSIS — Z978 Presence of other specified devices: Secondary | ICD-10-CM

## 2021-11-29 DIAGNOSIS — Z20822 Contact with and (suspected) exposure to covid-19: Secondary | ICD-10-CM | POA: Diagnosis present

## 2021-11-29 DIAGNOSIS — Z515 Encounter for palliative care: Secondary | ICD-10-CM

## 2021-11-29 DIAGNOSIS — J9602 Acute respiratory failure with hypercapnia: Secondary | ICD-10-CM | POA: Diagnosis present

## 2021-11-29 DIAGNOSIS — A419 Sepsis, unspecified organism: Principal | ICD-10-CM | POA: Diagnosis present

## 2021-11-29 DIAGNOSIS — E119 Type 2 diabetes mellitus without complications: Secondary | ICD-10-CM | POA: Diagnosis present

## 2021-11-29 DIAGNOSIS — M6282 Rhabdomyolysis: Secondary | ICD-10-CM | POA: Diagnosis present

## 2021-11-29 DIAGNOSIS — T405X1A Poisoning by cocaine, accidental (unintentional), initial encounter: Secondary | ICD-10-CM | POA: Diagnosis present

## 2021-11-29 DIAGNOSIS — Z66 Do not resuscitate: Secondary | ICD-10-CM | POA: Diagnosis not present

## 2021-11-29 DIAGNOSIS — I6312 Cerebral infarction due to embolism of basilar artery: Secondary | ICD-10-CM | POA: Diagnosis present

## 2021-11-29 DIAGNOSIS — J690 Pneumonitis due to inhalation of food and vomit: Secondary | ICD-10-CM | POA: Diagnosis present

## 2021-11-29 DIAGNOSIS — R6520 Severe sepsis without septic shock: Secondary | ICD-10-CM | POA: Diagnosis present

## 2021-11-29 DIAGNOSIS — I10 Essential (primary) hypertension: Secondary | ICD-10-CM | POA: Diagnosis present

## 2021-11-29 DIAGNOSIS — R4020 Unspecified coma: Secondary | ICD-10-CM | POA: Diagnosis present

## 2021-11-29 DIAGNOSIS — R680 Hypothermia, not associated with low environmental temperature: Secondary | ICD-10-CM | POA: Diagnosis present

## 2021-11-29 LAB — CBC WITH DIFFERENTIAL/PLATELET
Abs Immature Granulocytes: 0.05 10*3/uL (ref 0.00–0.07)
Basophils Absolute: 0 10*3/uL (ref 0.0–0.1)
Basophils Relative: 0 %
Eosinophils Absolute: 0.1 10*3/uL (ref 0.0–0.5)
Eosinophils Relative: 1 %
HCT: 50.3 % (ref 39.0–52.0)
Hemoglobin: 15.3 g/dL (ref 13.0–17.0)
Immature Granulocytes: 0 %
Lymphocytes Relative: 16 %
Lymphs Abs: 2 10*3/uL (ref 0.7–4.0)
MCH: 26.9 pg (ref 26.0–34.0)
MCHC: 30.4 g/dL (ref 30.0–36.0)
MCV: 88.4 fL (ref 80.0–100.0)
Monocytes Absolute: 0.8 10*3/uL (ref 0.1–1.0)
Monocytes Relative: 6 %
Neutro Abs: 9.5 10*3/uL — ABNORMAL HIGH (ref 1.7–7.7)
Neutrophils Relative %: 77 %
Platelets: 216 10*3/uL (ref 150–400)
RBC: 5.69 MIL/uL (ref 4.22–5.81)
RDW: 14.7 % (ref 11.5–15.5)
WBC: 12.5 10*3/uL — ABNORMAL HIGH (ref 4.0–10.5)
nRBC: 0 % (ref 0.0–0.2)

## 2021-11-29 LAB — ACETAMINOPHEN LEVEL: Acetaminophen (Tylenol), Serum: <10 ug/mL — ABNORMAL LOW (ref 10–30)

## 2021-11-29 LAB — TROPONIN I (HIGH SENSITIVITY)
Troponin I (High Sensitivity): 12 ng/L (ref <18)
Troponin I (High Sensitivity): 8 ng/L (ref <18)

## 2021-11-29 LAB — COMPREHENSIVE METABOLIC PANEL
ALT: 33 U/L (ref 0–44)
AST: 72 U/L — ABNORMAL HIGH (ref 15–41)
Albumin: 3.3 g/dL — ABNORMAL LOW (ref 3.5–5.0)
Alkaline Phosphatase: 84 U/L (ref 38–126)
Anion gap: 9 (ref 5–15)
BUN: 16 mg/dL (ref 6–20)
CO2: 25 mmol/L (ref 22–32)
Calcium: 8.6 mg/dL — ABNORMAL LOW (ref 8.9–10.3)
Chloride: 108 mmol/L (ref 98–111)
Creatinine, Ser: 0.68 mg/dL (ref 0.61–1.24)
GFR, Estimated: >60 mL/min (ref >60)
Glucose, Bld: 132 mg/dL — ABNORMAL HIGH (ref 70–99)
Potassium: 4 mmol/L (ref 3.5–5.1)
Sodium: 142 mmol/L (ref 135–145)
Total Bilirubin: 0.9 mg/dL (ref 0.3–1.2)
Total Protein: 6.4 g/dL — ABNORMAL LOW (ref 6.5–8.1)

## 2021-11-29 LAB — URINALYSIS, ROUTINE W REFLEX MICROSCOPIC
Bacteria, UA: NONE SEEN
Bilirubin Urine: NEGATIVE
Glucose, UA: NEGATIVE mg/dL
Ketones, ur: 5 mg/dL — AB
Leukocytes,Ua: NEGATIVE
Nitrite: NEGATIVE
Protein, ur: 30 mg/dL — AB
Specific Gravity, Urine: 1.027 (ref 1.005–1.030)
pH: 5 (ref 5.0–8.0)

## 2021-11-29 LAB — PROCALCITONIN: Procalcitonin: <0.1 ng/mL

## 2021-11-29 LAB — URINE DRUG SCREEN, QUALITATIVE (ARMC ONLY)
Amphetamines, Ur Screen: NOT DETECTED
Barbiturates, Ur Screen: NOT DETECTED
Benzodiazepine, Ur Scrn: NOT DETECTED
Cannabinoid 50 Ng, Ur ~~LOC~~: NOT DETECTED
Cocaine Metabolite,Ur ~~LOC~~: POSITIVE — AB
MDMA (Ecstasy)Ur Screen: NOT DETECTED
Methadone Scn, Ur: NOT DETECTED
Opiate, Ur Screen: NOT DETECTED
Phencyclidine (PCP) Ur S: NOT DETECTED
Tricyclic, Ur Screen: NOT DETECTED

## 2021-11-29 LAB — GLUCOSE, CAPILLARY: Glucose-Capillary: 155 mg/dL — ABNORMAL HIGH (ref 70–99)

## 2021-11-29 LAB — LACTIC ACID, PLASMA
Lactic Acid, Venous: 1.9 mmol/L (ref 0.5–1.9)
Lactic Acid, Venous: 1.6 mmol/L (ref 0.5–1.9)

## 2021-11-29 LAB — CK: Total CK: 4319 U/L — ABNORMAL HIGH (ref 49–397)

## 2021-11-29 LAB — SALICYLATE LEVEL: Salicylate Lvl: <7 mg/dL — ABNORMAL LOW (ref 7.0–30.0)

## 2021-11-29 LAB — APTT: aPTT: 27 seconds (ref 24–36)

## 2021-11-29 LAB — PROTIME-INR
INR: 1 (ref 0.8–1.2)
Prothrombin Time: 13.4 seconds (ref 11.4–15.2)

## 2021-11-29 LAB — MAGNESIUM: Magnesium: 1.9 mg/dL (ref 1.7–2.4)

## 2021-11-29 LAB — SARS CORONAVIRUS 2 BY RT PCR: SARS Coronavirus 2 by RT PCR: NEGATIVE

## 2021-11-29 MED ORDER — DOCUSATE SODIUM 100 MG PO CAPS: 100.0000 mg | ORAL_CAPSULE | Freq: Two times a day (BID) | ORAL | Status: DC | PRN

## 2021-11-29 MED ORDER — METHYLPREDNISOLONE SODIUM SUCC 125 MG IJ SOLR
80.0000 mg | Freq: Once | INTRAMUSCULAR | Status: DC
  Filled 2021-11-29: qty 2

## 2021-11-29 MED ORDER — BUDESONIDE 0.25 MG/2ML IN SUSP
0.2500 mg | Freq: Two times a day (BID) | RESPIRATORY_TRACT | Status: DC
  Administered 2021-11-29 – 2021-12-02 (×6): 0.25 mg via RESPIRATORY_TRACT
  Filled 2021-11-29 (×6): qty 2

## 2021-11-29 MED ORDER — CHLORHEXIDINE GLUCONATE CLOTH 2 % EX PADS
6.0000 | MEDICATED_PAD | Freq: Every day | CUTANEOUS | Status: DC
  Administered 2021-11-30 – 2021-12-02 (×3): 6 via TOPICAL

## 2021-11-29 MED ORDER — SODIUM CHLORIDE 0.9 % IV BOLUS
1000.0000 mL | Freq: Once | INTRAVENOUS | Status: AC
  Administered 2021-11-29: 1000 mL via INTRAVENOUS

## 2021-11-29 MED ORDER — METHYLPREDNISOLONE SODIUM SUCC 125 MG IJ SOLR
125.0000 mg | Freq: Once | INTRAMUSCULAR | Status: AC
  Administered 2021-11-29: 125 mg via INTRAVENOUS
  Filled 2021-11-29: qty 2

## 2021-11-29 MED ORDER — STROKE: EARLY STAGES OF RECOVERY BOOK: Freq: Once | Status: DC

## 2021-11-29 MED ORDER — POLYETHYLENE GLYCOL 3350 17 G PO PACK: 17.0000 g | PACK | Freq: Every day | ORAL | Status: DC | PRN

## 2021-11-29 MED ORDER — FENTANYL BOLUS VIA INFUSION
50.0000 ug | INTRAVENOUS | Status: DC | PRN
  Administered 2021-12-01 (×3): 50 ug via INTRAVENOUS

## 2021-11-29 MED ORDER — IPRATROPIUM-ALBUTEROL 0.5-2.5 (3) MG/3ML IN SOLN
3.0000 mL | RESPIRATORY_TRACT | Status: DC
  Administered 2021-11-29 – 2021-12-02 (×16): 3 mL via RESPIRATORY_TRACT
  Filled 2021-11-29 (×16): qty 3

## 2021-11-29 MED ORDER — IPRATROPIUM-ALBUTEROL 0.5-2.5 (3) MG/3ML IN SOLN
3.0000 mL | Freq: Four times a day (QID) | RESPIRATORY_TRACT | Status: DC | PRN
  Administered 2021-12-02: 3 mL via RESPIRATORY_TRACT

## 2021-11-29 MED ORDER — FENTANYL 2500MCG IN NS 250ML (10MCG/ML) PREMIX INFUSION
50.0000 ug/h | INTRAVENOUS | Status: DC
  Administered 2021-12-02: 50 ug/h via INTRAVENOUS
  Filled 2021-11-29: qty 250

## 2021-11-29 MED ORDER — PROPOFOL 10 MG/ML IV BOLUS
INTRAVENOUS | Status: AC
  Filled 2021-11-29: qty 20

## 2021-11-29 MED ORDER — PROPOFOL 1000 MG/100ML IV EMUL
5.0000 ug/kg/min | INTRAVENOUS | Status: DC
  Administered 2021-11-29 – 2021-11-30 (×2): 20 ug/kg/min via INTRAVENOUS
  Administered 2021-12-01: 10 ug/kg/min via INTRAVENOUS
  Administered 2021-12-02: 15 ug/kg/min via INTRAVENOUS
  Filled 2021-11-29 (×4): qty 100

## 2021-11-29 MED ORDER — INSULIN ASPART 100 UNIT/ML IJ SOLN
0.0000 [IU] | INTRAMUSCULAR | Status: DC
  Administered 2021-11-29: 3 [IU] via SUBCUTANEOUS
  Administered 2021-11-30 – 2021-12-01 (×3): 2 [IU] via SUBCUTANEOUS
  Administered 2021-12-02: 3 [IU] via SUBCUTANEOUS
  Administered 2021-12-02 (×3): 2 [IU] via SUBCUTANEOUS
  Administered 2021-12-02: 3 [IU] via SUBCUTANEOUS
  Filled 2021-11-29 (×9): qty 1

## 2021-11-29 MED ORDER — ETOMIDATE 2 MG/ML IV SOLN
INTRAVENOUS | Status: AC
  Administered 2021-11-29: 20 mg
  Filled 2021-11-29: qty 10

## 2021-11-29 MED ORDER — DOCUSATE SODIUM 50 MG/5ML PO LIQD
100.0000 mg | Freq: Two times a day (BID) | ORAL | Status: DC
Start: 2021-11-29 — End: 2021-12-03
  Administered 2021-11-29 – 2021-12-02 (×6): 100 mg
  Filled 2021-11-29 (×6): qty 10

## 2021-11-29 MED ORDER — PROPOFOL 1000 MG/100ML IV EMUL
INTRAVENOUS | Status: AC
  Administered 2021-11-29: 10 ug/kg/min
  Filled 2021-11-29: qty 100

## 2021-11-29 MED ORDER — LACTATED RINGERS IV BOLUS
1000.0000 mL | Freq: Once | INTRAVENOUS | Status: AC
  Administered 2021-11-29: 1000 mL via INTRAVENOUS

## 2021-11-29 MED ORDER — SUCCINYLCHOLINE CHLORIDE 200 MG/10ML IV SOSY
PREFILLED_SYRINGE | INTRAVENOUS | Status: AC
  Filled 2021-11-29: qty 10

## 2021-11-29 MED ORDER — SODIUM CHLORIDE 0.9 % IV SOLN
2.0000 g | Freq: Once | INTRAVENOUS | Status: AC
  Administered 2021-11-29: 2 g via INTRAVENOUS
  Filled 2021-11-29: qty 12.5

## 2021-11-29 MED ORDER — FENTANYL 2500MCG IN NS 250ML (10MCG/ML) PREMIX INFUSION
INTRAVENOUS | Status: AC
  Administered 2021-11-29: 75 ug/h via INTRAVENOUS
  Filled 2021-11-29: qty 250

## 2021-11-29 MED ORDER — VANCOMYCIN HCL 1500 MG/300ML IV SOLN
1500.0000 mg | Freq: Once | INTRAVENOUS | Status: AC
  Administered 2021-11-29: 1500 mg via INTRAVENOUS
  Filled 2021-11-29: qty 300

## 2021-11-29 MED ORDER — ROCURONIUM BROMIDE 10 MG/ML (PF) SYRINGE
PREFILLED_SYRINGE | INTRAVENOUS | Status: AC
  Administered 2021-11-29: 100 mg
  Filled 2021-11-29: qty 10

## 2021-11-29 MED ORDER — ENOXAPARIN SODIUM 40 MG/0.4ML IJ SOSY
40.0000 mg | PREFILLED_SYRINGE | INTRAMUSCULAR | Status: DC
  Administered 2021-11-29: 40 mg via SUBCUTANEOUS
  Filled 2021-11-29: qty 0.4

## 2021-11-29 MED ORDER — POLYETHYLENE GLYCOL 3350 17 G PO PACK
17.0000 g | PACK | Freq: Every day | ORAL | Status: DC
  Administered 2021-11-30 – 2021-12-02 (×3): 17 g
  Filled 2021-11-29 (×3): qty 1

## 2021-11-29 NOTE — Progress Notes (Signed)
eLink Physician-Brief Progress Note Patient Name: Clinton Knight DOB: 1961-04-26 MRN: [id number]   Date of Service  12-24-21  HPI/Events of Note  61 yr old male with history of substance abuse found unresponsive and hypoxic.  Brought to ED and required intubation.  CT head revealed multiple strokes.  CT chest with bilat infiltrates possible aspiration.  eICU Interventions  Chart reviewed     Intervention Category Evaluation Type: New Patient Evaluation  Henry Russel, P 12-24-21, 11:20 PM

## 2021-11-29 NOTE — ED Provider Notes (Addendum)
[name] Provider Note    Event Date/Time   First MD Initiated Contact with Patient 12/15/21 1858     (approximate)   History   Respiratory Distress   HPI  Clinton Knight is a 61 y.o. male who presents to the ED for evaluation of Respiratory Distress   Patient presents to the ED as a Clinton Knight from his home via EMS for evaluation of unresponsiveness and hypoxia.  He was found down in his room at a boardinghouse where he resides.  Apparently had not been seen since Friday, a few days ago.  Because his housemates have not seen him, they busted down his door and found him down.  EMS reports finding him hypoxic, sats of 60s-80s and slowly improving on nonrebreather.  History limited.  Later history obtained by family once they arrive and find out he is here and we find out his name.  He apparently is a history of HTN, DM and crack cocaine abuse.  A sister reports that she was with him Sunday morning, but daughter reports she tried calling him Sunday afternoon and yesterday for Father's Day but he did not pick up.  At least a couple days since his last known normal.   Physical Exam   Triage Vital Signs: ED Triage Vitals  Enc Vitals Group     BP --      Pulse --      Resp --      Temp Dec 15, 2021 1847 97.6 F (36.4 C)     Temp Source 12/15/21 1847 Axillary     SpO2 2021-12-15 1847 (!) 86 %     Weight December 15, 2021 1848 160 lb (72.6 kg)     Height 15-Dec-2021 1848  (1.727 m)     Head Circumference --      Peak Flow --      Pain Score --      Pain Loc --      Pain Edu? --      Excl. in GC? --     Most recent vital signs: Vitals:   12/15/2021 2000 Dec 15, 2021 2015  BP: (!) 146/99 (!) 156/107  Pulse: 84 73  Resp: (!) 26 (!) 21  Temp: (!) 91.1 F (32.8 C) (!) 91.1 F (32.8 C)  SpO2: 95% 96%    General: Unresponsive, GCS 3.  Foul smell of stale urine CV:  Good peripheral perfusion. RRR Resp:  Gurgling respirations, hypoxic and somewhat increased  resistance with bagging. Abd:  No distention.  Soft MSK:  Mild left-sided periorbital swelling with a superficial abrasion to the 5 o'clock position.  Globe intact beneath this. Neuro:  No rigidity or clonus.  No seizure activity. Other:     ED Results / Procedures / Treatments   Labs (all labs ordered are listed, but only abnormal results are displayed) Labs Reviewed  COMPREHENSIVE METABOLIC PANEL - Abnormal; Notable for the following components:      Result Value   Glucose, Bld 132 (*)    Calcium 8.6 (*)    Total Protein 6.4 (*)    Albumin 3.3 (*)    AST 72 (*)    All other components within normal limits  CBC WITH DIFFERENTIAL/PLATELET - Abnormal; Notable for the following components:   WBC 12.5 (*)    Neutro Abs 9.5 (*)    All other components within normal limits  BLOOD GAS, ARTERIAL - Abnormal; Notable for the following components:   pH, Arterial 7.29 (*)  pCO2 arterial 51 (*)    pO2, Arterial 75 (*)    Acid-base deficit 2.7 (*)    All other components within normal limits  URINALYSIS, ROUTINE W REFLEX MICROSCOPIC - Abnormal; Notable for the following components:   Color, Urine YELLOW (*)    APPearance CLEAR (*)    Hgb urine dipstick MODERATE (*)    Ketones, ur 5 (*)    Protein, ur 30 (*)    All other components within normal limits  URINE DRUG SCREEN, QUALITATIVE ([facility] ONLY) - Abnormal; Notable for the following components:   Cocaine Metabolite,Ur [place] POSITIVE (*)    All other components within normal limits  CK - Abnormal; Notable for the following components:   Total CK 4,319 (*)    All other components within normal limits  SALICYLATE LEVEL - Abnormal; Notable for the following components:   Salicylate Lvl <7.0 (*)    All other components within normal limits  ACETAMINOPHEN LEVEL - Abnormal; Notable for the following components:   Acetaminophen (Tylenol), Serum <10 (*)    All other components within normal limits  CULTURE, BLOOD (ROUTINE X 2)  CULTURE,  BLOOD (ROUTINE X 2)  MAGNESIUM  LACTIC ACID, PLASMA  PROTIME-INR  APTT  PROCALCITONIN  LACTIC ACID, PLASMA  PROCALCITONIN  TROPONIN I (HIGH SENSITIVITY)  TROPONIN I (HIGH SENSITIVITY)    EKG Sinus rhythm with a rate of 85 bpm.  Rightward axis.  QTc 511.  No STEMI.  RADIOLOGY Chest x-ray interpreted by me with ETT in good position. CT head interpreted by me with multiple hypodensities concerning for stroke.  No bleed.  Official radiology report(s): CT HEAD WO CONTRAST ( )  Result Date: 12/01/2021 CLINICAL DATA:  Found down, hypertensive EXAM: CT HEAD WITHOUT CONTRAST TECHNIQUE: Contiguous axial images were obtained from the base of the skull through the vertex without intravenous contrast. RADIATION DOSE REDUCTION: This exam was performed according to the departmental dose-optimization program which includes automated exposure control, adjustment of the mA and/or kV according to patient size and/or use of iterative reconstruction technique. COMPARISON:  None Available. FINDINGS: Brain: Confluent area of hypodensity in the right caudate, left-greater-than-right thalamus, mid brain, pons, and left cerebellum anteriorly. Additional area of hypodensity in the left occipital lobe, extending to the left parietal lobe (series 6, image 30) and in the inferior left cerebellum. No acute hemorrhage, mass, mass effect, or midline shift. Lacunar infarcts in the basal ganglia. No hydrocephalus or extra-axial collection. Vascular: No definite hyperdense vessel. Density in the distal basilar artery is conspicuous but appear similar to other vasculature. Atherosclerotic calcifications in the intracranial carotid and vertebral arteries. Skull: Normal. Negative for fracture or focal lesion. Sinuses/Orbits: Mucosal thickening in the left maxillary sinus and ethmoid air cells. The orbits are unremarkable. Other: The mastoids are well aerated. IMPRESSION: Hypodensity extending from the right caudate and bilateral  thalami into the midbrain, pons, and left cerebellum with additional areas of hypodensity in the left parieto-occipital region and left inferior cerebellum, concerning for infarcts that are most likely acute to subacute. These results were called by telephone at the time of interpretation on 11/28/2021 at 8:57 pm to provider [facility] , who verbally acknowledged these results. Electronically Signed   By: Wiliam Ke M.D.   On: 11/17/2021 20:59   DG Abdomen 1 View  Result Date: 11/23/2021 CLINICAL DATA:  Status post intubation and OG tube placement. EXAM: ABDOMEN - 1 VIEW COMPARISON:  None Available. FINDINGS: An endotracheal tube is seen with its distal tip approximately  2.5 cm from the carina. Mild atelectatic changes are seen within the left lung base. A nasogastric tube is seen with its distal tip overlying the body of the stomach. The distal side hole sits approximately 3.3 cm distal to the expected region of the gastroesophageal junction. The bowel gas pattern is normal. No radio-opaque calculi or other significant radiographic abnormality are seen. IMPRESSION: Nasogastric tube positioning, as described above. Electronically Signed   By: Aram Candela M.D.   On: 12/03/2021 19:29   DG Chest Portable 1 View  Result Date: 11/21/2021 CLINICAL DATA:  Respiratory distress EXAM: PORTABLE CHEST 1 VIEW COMPARISON:  None Available. FINDINGS: Somewhat limited due to rotation. Endotracheal tube tip is approximately 9 mm above the carina. Enteric tube tip is in the stomach. Cardiomediastinal silhouette appears within normal limits. Small opacities in the left lower lung zone which may represent subsegmental atelectasis. No significant pleural effusion or pneumothorax identified. IMPRESSION: 1. Medical devices as described. 2. Likely mild subsegmental atelectasis in the left lower lung zone. Electronically Signed   By: Jannifer Hick M.D.   On: 11/16/2021 19:25    PROCEDURES and INTERVENTIONS:  .1-3  Lead EKG Interpretation  Performed by: Delton Prairie, MD Authorized by: Delton Prairie, MD     Interpretation: normal     ECG rate:  90   ECG rate assessment: normal     Rhythm: sinus rhythm     Ectopy: none     Conduction: normal   .Critical Care  Performed by: Delton Prairie, MD Authorized by: Delton Prairie, MD   Critical care provider statement:    Critical care time (minutes):  30   Critical care time was exclusive of:  Separately billable procedures and treating other patients   Critical care was necessary to treat or prevent imminent or life-threatening deterioration of the following conditions:  CNS failure or compromise   Critical care was time spent personally by me on the following activities:  Development of treatment plan with patient or surrogate, discussions with consultants, evaluation of patient's response to treatment, examination of patient, ordering and review of laboratory studies, ordering and review of radiographic studies, ordering and performing treatments and interventions, pulse oximetry, re-evaluation of patient's condition and review of old charts Procedure Name: Intubation Date/Time: 11/15/2021 9:11 PM  Performed by: Delton Prairie, MDPre-anesthesia Checklist: Patient identified, Patient being monitored, Emergency Drugs available, Timeout performed and Suction available Oxygen Delivery Method: Non-rebreather mask Preoxygenation: Pre-oxygenation with 100% oxygen Induction Type: Rapid sequence Ventilation: Mask ventilation without difficulty Laryngoscope Size: Glidescope and 3 Tube size: 8.0 mm Number of attempts: 1 Airway Equipment and Method: Rigid stylet Placement Confirmation: ETT inserted through vocal cords under direct vision, CO2 detector and Breath sounds checked- equal and bilateral      Medications  propofol (DIPRIVAN) 1000 MG/100ML infusion (10 mcg/kg/min  72.6 kg Intravenous New Bag/Given 11/23/2021 1856)  vancomycin (VANCOREADY) IVPB 1500 mg/300 mL  (1,500 mg Intravenous New Bag/Given 12/04/2021 2025)  etomidate (AMIDATE) 2 MG/ML injection (20 mg  Given 11/13/2021 1853)  rocuronium bromide 100 MG/10ML SOSY (100 mg  Given 11/23/2021 1853)  sodium chloride 0.9 % bolus 1,000 mL (0 mLs Intravenous Stopped 11/16/2021 2032)  ceFEPIme (MAXIPIME) 2 g in sodium chloride 0.9 % 100 mL IVPB (2 g Intravenous New Bag/Given 11/25/2021 2019)     IMPRESSION / MDM / ASSESSMENT AND PLAN / ED COURSE  I reviewed the triage vital signs and the nursing notes.  Differential diagnosis includes, but is not limited to, stroke, seizure, intracranial  hemorrhage, overdose, DKA  {Patient presents with symptoms of an acute illness or injury that is potentially life-threatening.  61 year old male presents as a John Knight with poor responsiveness, hypoxic respiratory failure requiring intubation and ICU admission.  He is found to have multifocal strokes on his CT scan of the head.  Family reports frequent crack cocaine use as well.  He has not been seen at his baseline for a couple days and not in the window for any intervention.  He is intubated for respiratory failure and will be admitted to the ICU.  ABG with mild acidosis on the vent, urine with no infectious features.  CK is elevated for which she receives IV fluids.  His core temp is noted to be low, alongside a mild leukocytosis concerning for the possibility of sepsis.  Cultures are drawn and he is started on broad-spectrum antibiotics.  Consulted with ICU who agrees to admit. Family updated at the bedside.  Clinical Course as of 2021-12-21 2106  Tue 2021/12/21  Gertie Fey to CT [DS]  1947 Sister arrives to the ED.  Hortencia Conradi.  Confirms the patient is Saxton Chain.  We get his driver's license from his wallet in his pants and given to registration to try to get his chart.  She tells me he is a diabetic, hypertensive and has had a stroke in the past with no residual deficits.  Enjoys recreational drugs such as crack cocaine.   Unknown if uses needles. She saw on Wednesday or Thursday of last week and he was fine. [DS]  2032 I consult with I consult with ICU nurse practitioner who agrees to see the patient for admission to the ICU [DS]    Clinical Course User Index [DS] Delton Prairie, MD     FINAL CLINICAL IMPRESSION(S) / ED DIAGNOSES   Final diagnoses:  None     Rx / DC Orders   ED Discharge Orders     None        Note:  This document was prepared using Dragon voice recognition software and may include unintentional dictation errors.   Delton Prairie, MD 2021/12/21 2113    Delton Prairie, MD 2021-12-21 2154

## 2021-11-29 NOTE — ED Notes (Signed)
8.0 ETT placed by MD Smith, 26 @ teeth, positive color change, bilateral breath sounds present.

## 2021-11-29 NOTE — ED Notes (Signed)
Pt's Spo2 92%, resp called at 93% and peep was adjusted. Trying to move pt to ICU, no nurse available at this time. Will be available shortly.

## 2021-11-29 NOTE — ED Triage Notes (Signed)
Per EMS report, pt last seen by members of group home on Friday. Pt found  today unresponsive. EMS reports random white pills on ground. No other info or hx able to be obtained. MD Katrinka Blazing at bedside

## 2021-11-30 ENCOUNTER — Inpatient Hospital Stay: Payer: Medicaid Other

## 2021-11-30 ENCOUNTER — Inpatient Hospital Stay (HOSPITAL_COMMUNITY)
Admit: 2021-11-30 | Discharge: 2021-11-30 | Disposition: A | Discharge status: Home / Self Care | Payer: Medicaid Other | Attending: Nurse Practitioner | Admitting: Nurse Practitioner

## 2021-11-30 DIAGNOSIS — I651 Occlusion and stenosis of basilar artery: Secondary | ICD-10-CM

## 2021-11-30 DIAGNOSIS — T50901A Poisoning by unspecified drugs, medicaments and biological substances, accidental (unintentional), initial encounter: Secondary | ICD-10-CM

## 2021-11-30 DIAGNOSIS — I6389 Other cerebral infarction: Secondary | ICD-10-CM

## 2021-11-30 LAB — CBC
HCT: 58.9 % — ABNORMAL HIGH (ref 39.0–52.0)
Hemoglobin: 17.7 g/dL — ABNORMAL HIGH (ref 13.0–17.0)
MCH: 26.9 pg (ref 26.0–34.0)
MCHC: 30.1 g/dL (ref 30.0–36.0)
MCV: 89.4 fL (ref 80.0–100.0)
Platelets: 228 10*3/uL (ref 150–400)
RBC: 6.59 MIL/uL — ABNORMAL HIGH (ref 4.22–5.81)
RDW: 15.6 % — ABNORMAL HIGH (ref 11.5–15.5)
WBC: 9.7 10*3/uL (ref 4.0–10.5)
nRBC: 0 % (ref 0.0–0.2)

## 2021-11-30 LAB — ECHOCARDIOGRAM COMPLETE
AR max vel: 2.46 cm2
AV Area VTI: 3.48 cm2
AV Area mean vel: 2.8 cm2
AV Mean grad: 2 mmHg
AV Peak grad: 5.3 mmHg
Ao pk vel: 1.15 m/s
Area-P 1/2: 4.99 cm2
Height: 68 in
S' Lateral: 2.74 cm
Weight: 2603.19 oz

## 2021-11-30 LAB — BASIC METABOLIC PANEL
Anion gap: 8 (ref 5–15)
BUN: 19 mg/dL (ref 6–20)
CO2: 24 mmol/L (ref 22–32)
Calcium: 8.9 mg/dL (ref 8.9–10.3)
Chloride: 111 mmol/L (ref 98–111)
Creatinine, Ser: 0.92 mg/dL (ref 0.61–1.24)
GFR, Estimated: >60 mL/min (ref >60)
Glucose, Bld: 113 mg/dL — ABNORMAL HIGH (ref 70–99)
Potassium: 5 mmol/L (ref 3.5–5.1)
Sodium: 143 mmol/L (ref 135–145)

## 2021-11-30 LAB — LIPID PANEL
Cholesterol: 197 mg/dL (ref 0–200)
HDL: 66 mg/dL (ref >40)
LDL Cholesterol: 110 mg/dL — ABNORMAL HIGH (ref 0–99)
Total CHOL/HDL Ratio: 3 RATIO
Triglycerides: 105 mg/dL (ref <150)
VLDL: 21 mg/dL (ref 0–40)

## 2021-11-30 LAB — HEMOGLOBIN A1C
Hgb A1c MFr Bld: 5.6 % (ref 4.8–5.6)
Mean Plasma Glucose: 114.02 mg/dL

## 2021-11-30 LAB — GLUCOSE, CAPILLARY
Glucose-Capillary: 122 mg/dL — ABNORMAL HIGH (ref 70–99)
Glucose-Capillary: 105 mg/dL — ABNORMAL HIGH (ref 70–99)
Glucose-Capillary: 114 mg/dL — ABNORMAL HIGH (ref 70–99)
Glucose-Capillary: 100 mg/dL — ABNORMAL HIGH (ref 70–99)
Glucose-Capillary: 130 mg/dL — ABNORMAL HIGH (ref 70–99)
Glucose-Capillary: 74 mg/dL (ref 70–99)

## 2021-11-30 LAB — BLOOD GAS, ARTERIAL
Acid-base deficit: 4.9 mmol/L — ABNORMAL HIGH (ref 0.0–2.0)
Bicarbonate: 22.8 mmol/L (ref 20.0–28.0)
FIO2: 60 %
MECHVT: 500 mL
O2 Saturation: 100 %
PEEP: 10 cmH2O
Patient temperature: 37
RATE: 22 resp/min
pCO2 arterial: 52 mmHg — ABNORMAL HIGH (ref 32–48)
pH, Arterial: 7.25 — ABNORMAL LOW (ref 7.35–7.45)
pO2, Arterial: 194 mmHg — ABNORMAL HIGH (ref 83–108)

## 2021-11-30 LAB — HIV ANTIBODY (ROUTINE TESTING W REFLEX): HIV Screen 4th Generation wRfx: NONREACTIVE

## 2021-11-30 LAB — LACTIC ACID, PLASMA: Lactic Acid, Venous: 4 mmol/L (ref 0.5–1.9)

## 2021-11-30 LAB — PROCALCITONIN: Procalcitonin: 3.95 ng/mL

## 2021-11-30 LAB — STREP PNEUMONIAE URINARY ANTIGEN: Strep Pneumo Urinary Antigen: NEGATIVE

## 2021-11-30 LAB — MRSA NEXT GEN BY PCR, NASAL: MRSA by PCR Next Gen: NOT DETECTED

## 2021-11-30 LAB — MAGNESIUM: Magnesium: 2 mg/dL (ref 1.7–2.4)

## 2021-11-30 LAB — PHOSPHORUS: Phosphorus: 4.8 mg/dL — ABNORMAL HIGH (ref 2.5–4.6)

## 2021-11-30 LAB — CK: Total CK: 4989 U/L — ABNORMAL HIGH (ref 49–397)

## 2021-11-30 MED ORDER — PANTOPRAZOLE 2 MG/ML SUSPENSION
40.0000 mg | Freq: Every day | ORAL | Status: DC
  Administered 2021-11-30 – 2021-12-02 (×3): 40 mg
  Filled 2021-11-30 (×3): qty 20

## 2021-11-30 MED ORDER — ORAL CARE MOUTH RINSE
15.0000 mL | OROMUCOSAL | Status: DC
  Administered 2021-11-30 – 2021-12-02 (×27): 15 mL via OROMUCOSAL

## 2021-11-30 MED ORDER — SODIUM CHLORIDE 0.9 % IV SOLN
3.0000 g | Freq: Four times a day (QID) | INTRAVENOUS | Status: DC
  Administered 2021-11-30 – 2021-12-02 (×10): 3 g via INTRAVENOUS
  Filled 2021-11-30: qty 8
  Filled 2021-11-30: qty 3
  Filled 2021-11-30 (×3): qty 8
  Filled 2021-11-30 (×2): qty 3
  Filled 2021-11-30: qty 8
  Filled 2021-11-30: qty 3
  Filled 2021-11-30 (×4): qty 8

## 2021-11-30 MED ORDER — ACETAMINOPHEN 325 MG PO TABS
650.0000 mg | ORAL_TABLET | Freq: Four times a day (QID) | ORAL | Status: DC | PRN
Start: 2021-11-30 — End: 2021-12-03
  Administered 2021-11-30: 650 mg via ORAL
  Filled 2021-11-30: qty 2

## 2021-11-30 MED ORDER — IOHEXOL 300 MG/ML  SOLN
100.0000 mL | Freq: Once | INTRAMUSCULAR | Status: AC | PRN
  Administered 2021-11-30: 100 mL via INTRAVENOUS

## 2021-11-30 MED ORDER — JUVEN PO PACK
1.0000 | PACK | Freq: Two times a day (BID) | ORAL | Status: DC
  Administered 2021-12-01 – 2021-12-02 (×4): 1

## 2021-11-30 MED ORDER — FREE WATER
30.0000 mL | Status: DC
  Administered 2021-11-30 – 2021-12-02 (×11): 30 mL

## 2021-11-30 MED ORDER — ORAL CARE MOUTH RINSE: 15.0000 mL | OROMUCOSAL | Status: DC | PRN

## 2021-11-30 MED ORDER — PIVOT 1.5 CAL PO LIQD
1000.0000 mL | ORAL | Status: DC
  Administered 2021-11-30 – 2021-12-02 (×2): 1000 mL
  Filled 2021-11-30: qty 1000

## 2021-11-30 MED ORDER — LACTATED RINGERS IV SOLN: INTRAVENOUS | Status: DC

## 2021-11-30 MED ORDER — FENTANYL CITRATE (PF) 100 MCG/2ML IJ SOLN
INTRAMUSCULAR | Status: AC
  Filled 2021-11-30: qty 2

## 2021-11-30 NOTE — Progress Notes (Signed)
To whom it may concern   Please excuse Clinton Knight from work obligations June 21st to June 22nd , 2003 due to family emergency.

## 2021-11-30 NOTE — Progress Notes (Signed)
PT Cancellation Note  Patient Details Name: Clinton Knight MRN: [id number] DOB: 09/17/1960   Cancelled Treatment:    Reason Eval/Treat Not Completed: Medical issues which prohibited therapy (Pt remain intubated, febrile, not medically ready for PT evaluation. Will sign-off and await new order once pt is ready to commence rehabilitative process.)  12:52 PM, 11/30/21 Rosamaria Lints, PT, DPT Physical Therapist - [facility]  [phone] (ASCOM)    Tejon Gracie C 11/30/2021, 12:52 PM

## 2021-11-30 NOTE — Progress Notes (Signed)
   11/30/21 2100  Clinical Encounter Type  Visited With Patient and family together  Visit Type Initial  Referral From Nurse  Consult/Referral To Chaplain   Chaplain responded to nurse page. Chaplain provided support for EOL decisions. Chaplain accompanied daughter to meeting with [employer]. Chaplain provided prayer with patient and family. On call Chaplain will follow up with support for next steps in process and [facility].

## 2021-11-30 NOTE — Progress Notes (Signed)
*  PRELIMINARY RESULTS* Echocardiogram 2D Echocardiogram has been performed.  Clinton Knight 11/30/2021, 11:20 AM

## 2021-11-30 NOTE — Progress Notes (Signed)
Family at bedside and Bruce w/ [facility] here to have meeting and discussion with pt daughter.

## 2021-11-30 NOTE — Consult Note (Signed)
Neurology Consultation Reason for Consult: Stroke Referring Physician: Molli Hazard  CC: Stroke  History is obtained from:chart, family  HPI: Clinton Knight is a 61 y.o. male who was last seen Friday night, then was found in extremis last night. He was hypoxemic and unresponsive. He was intubated in the emergency department. On his initial head CT, he had extensive posterior circulation infarcts and he had a dismal neurological exam. MRI was obtained today which demonstrates massive posterior circulation infarcts.    LKW: Friday night.  tpa given?: no, out of window   ROS:  Unable to obtain due to altered mental status.   No past medical history on file.   No family history on file.   Social History:  has no history on file for tobacco use, alcohol use, and drug use.   Exam: Current vital signs: BP (!) 140/92   Pulse (!) 110   Temp (!) 102 F (38.9 C)   Resp (!) 22   Ht 5\' 8"  (1.727 m)   Wt 73.8 kg   SpO2 99%   BMI 24.74 kg/m  Vital signs in last 24 hours: Temp:  [91.1 F (32.8 C)-102.4 F (39.1 C)] 102 F (38.9 C) (06/21 0930) Pulse Rate:  [73-125] 110 (06/21 1015) Resp:  [12-33] 22 (06/21 1015) BP: (99-163)/(64-107) 140/92 (06/21 0300) SpO2:  [86 %-100 %] 99 % (06/21 1015) Arterial Line BP: (122-174)/(75-83) 174/83 (06/21 1015) FiO2 (%):  [40 %-100 %] 40 % (06/21 1119) Weight:  [72.6 kg-73.8 kg] 73.8 kg (06/21 0500)   Physical Exam  In bed, Intubated  Neuro: Mental Status: Patient is comatose, does not open eyes or follow commands Cranial Nerves: II: does not blink to threat.. I cannot compare pupils due to difficuly with opening eyes due to edema. There is some reaction, though very sluggish in the right eye, I do not see any in the left.  III,IV, VI: no response to dolls eye in the right eye, unable to assess left eye.  V: VII: weak corneal on the right.  X: cough is present.  Motor: No movement to noxious stimulation.  Sensory: As above.   Cerebellar: Does not perform.    I have reviewed labs in epic and the results pertinent to this consultation are: ABG 7.25/52/194    I have reviewed the images obtained:MRI brain - extensive restricted diffusion with T2 change in the entire basilar/PCA distrubution including bilateral thalami. There is a flow void, at least in the proximal basilar.   Impression: 61 yo M with massive posterior circulation infarcts. His prognosis is dismal. I doubt he could survive this, but if he were to, it would be in a severely debilitated state likely with tracheostomy and feeding tube. Possible etiology is embolic vs basilar vasospasm from cocaine. Given the confulent pethechia in the pons, I would be hesitant to continue antiplatelets.   Recommendations: 1) Recommend palliative care discussions with family.  2) If aggressive care is pursued, would get echo, telemetry. 3) hold antiplatelets 4) Could restart lovenox for DVT prophylaxis after 48 hours.    This patient is critically ill and at significant risk of neurological worsening, death and care requires constant monitoring of vital signs, hemodynamics,respiratory and cardiac monitoring, neurological assessment, discussion with family, other specialists and medical decision making of high complexity. I spent 35 minutes of neurocritical care time  in the care of  this patient. This was time spent independent of any time provided by nurse practitioner or PA.  67, MD  [facility] [phone]  If 7pm- 7am, please page neurology on call as listed in AMION. 11/30/2021  3:44 PM

## 2021-11-30 NOTE — Procedures (Signed)
Arterial Catheter Insertion Procedure Note  Clinton Knight  [id number]  08/06/1960  Date:11/30/21  Time:5:09 AM   Provider Performing: Loraine Leriche   Procedure: Insertion of Arterial Line (84037) with US guidance (54360)   Indication(s) Blood pressure monitoring and/or need for frequent ABGs  Consent Unable to obtain consent due to emergent nature of procedure.  Anesthesia None  Time Out Verified patient identification, verified procedure, site/side was marked, verified correct patient position, special equipment/implants available, medications/allergies/relevant history reviewed, required imaging and test results available.  Sterile Technique Maximal sterile technique including full sterile barrier drape, hand hygiene, sterile gown, sterile gloves, mask, hair covering, sterile ultrasound probe cover (if used).  Procedure Description Area of catheter insertion was cleaned with chlorhexidine and draped in sterile fashion. With real-time ultrasound guidance an arterial catheter was placed into the right radial artery.  Appropriate arterial tracings confirmed on monitor.    Complications/Tolerance None; patient tolerated the procedure well.  EBL Minimal  Specimen(s) None   [name], DNP, CCRN, FNP-C, AGACNP-BC Acute Care & Family Nurse Practitioner   Pulmonary & Critical Care  See Amion for personal pager PCCM on call pager [phone] until 7 am

## 2021-11-30 NOTE — Progress Notes (Signed)
NAME:  Clinton Knight, MRN:  [id number], DOB:  [birth year], LOS: 1 ADMISSION DATE:  12-27-2021, CONSULTATION DATE: Dec 27, 2021 REFERRING MD: Delton Prairie MD, CHIEF COMPLAINT: Unresponsiveness   HPI  61 y.o  male with significant PMH of hypertension, DM and polysubstance abuse who presented to the ED with chief complaints of respiratory distress.  Per ED reports, EMS was called to patient's boarding house after being found down by housemate.  Per reports, patient's housemate had not heard from him since Friday however patient's daughter reports that she tried calling him Sunday afternoon and yesterday for Father's Day but he did not pick up.  Last time seen normal was Sunday morning per the patient's sister who claimed she was with the patient.  Per EMS report, patient's housemate busted door open and found him down with what appeared to be white substance on the floor.  On EMS arrival, patient was found to be hypoxic with sats in the 60s to 80s which slowly improved on nonrebreather.  ED Course: In the emergency department, the temperature was 36.4C, the heart rate 84 beats/minute, the blood pressure 146/99 mm Hg, the respiratory rate 26 breaths/minute, and the oxygen saturation 95% on NRB.  Patient remained unresponsive with gurgling respiration therefore he was intubated for airway protection.  Toxicology was positive for Cocaine.  Friends and family confirmed frequent use of  crack cocaine.  Pertinent Labs in Red/Diagnostics Findings: Na+/ K+: 142/4.0 Glucose: 132 BUN/Cr.:  16/0.62 AST/ALT: 72/33   WBC/ TMAX: 5.5 afebrile PCT: negative <0.10 CK: 4319 Arterial Blood Gas result:  pO2 75; pCO2 51; pH 7.29;  HCO3 24.5, %O2 Sat 95.4.   Due to possible sepsis and significantly elevated CK,Patient given IV fluids and started on broad-spectrum antibiotics Vanco and cefepime. PCCM consulted.  Significant Hospital Events: Including procedures, antibiotic start and stop dates in addition to other  pertinent events   Started Unasyn 6/20- Intubated 6/20  Interim History / Subjective:  This morning the patient is hypothermic.  Tachycardic to 105.  Sedated.  FiO2 has been weaned to 60%.  Remains mechanically ventilated.   Past Medical History  Polysubstance abuse (crack cocaine abuse) Hypertension Diabetes  Significant Hospital Events   6/20: Admitted to the ICU with acute hypoxic hypercapnic respiratory failure in the setting of cocaine intoxication 6/20: Ventilator settings are now down to 50% FiO2 and PEEP of 10  Consults:  PCCM  Procedures:  6/20: Intubation  Significant Diagnostic Tests:  6/20: Noncontrast CT head>Hypodensity extending from the right caudate and bilateral thalami into the midbrain, pons, and left cerebellum with additional areas of hypodensity in the left parieto-occipital region and left inferior cerebellum, concerning for infarcts that are most likely acute to subacute 6/20: CTA Chest>Moderate severity bilateral multifocal infiltrates. 2. Marked severity left lower lobe consolidation  Micro Data:  6/20: SARS-CoV-2 PCR> negative 6/20: Blood culture x2> 6/20: Urine Culture> 6/20: MRSA PCR>>  6/20: Strep pneumo urinary antigen> 6/20: Legionella urinary antigen> 6/20: Tracheal aspirate>  Antimicrobials:  Vancomycin and Cefepime x 1 dose Now on Unasyn 6/20-6/25 (5 day course planned)  OBJECTIVE  Blood pressure (!) 140/92, pulse (!) 105, temperature (!) 97 F (36.1 C), temperature source Bladder, resp. rate (!) 22, height 5\' 8"  (1.727 m), weight 73.8 kg, SpO2 100 %.    Vent Mode: PRVC FiO2 (%):  [60 %-100 %] 60 % Set Rate:  [22 bmp] 22 bmp Vt Set:  [500 mL] 500 mL PEEP:  [10 cmH20] 10 cmH20 Plateau Pressure:  [33 cmH20] 33  cmH20   Intake/Output Summary (Last 24 hours) at 11/30/2021 0818 Last data filed at December 14, 2021 2109 Gross per 24 hour  Intake 99.79 ml  Output --  Net 99.79 ml   Filed Weights   Dec 14, 2021 1848 12/14/21 2248 11/30/21 0500   Weight: 72.6 kg 73.2 kg 73.8 kg   Physical Examination   Temp:  [91.1 F (32.8 C)-97.6 F (36.4 C)] 97 F (36.1 C) (06/21 0300) Pulse Rate:  [73-125] 105 (06/21 0300) Resp:  [12-33] 22 (06/21 0300) BP: (99-163)/(64-107) 140/92 (06/21 0300) SpO2:  [86 %-100 %] 100 % (06/21 0300) FiO2 (%):  [60 %-100 %] 60 % (06/21 0244) Weight:  [72.6 kg-73.8 kg] 73.8 kg (06/21 0500)  GENERAL:61 year-old critically ill patient lying in the bed intubated and sedated EYES: Pupils equal, round, minimally reactive to light and accommodation on the right. Unable to asses the left due to periorbital edema.  No scleral icterus. Extraocular muscles not checked HEENT: Head traumatic see below, normocephalic. Oropharynx and nasopharynx with tan secretions  NECK:  Supple, no jugular venous distention. No thyroid enlargement, no tenderness.  LUNGS: Mechanical breath sounds with bilateral crackles CARDIOVASCULAR: S1, S2 normal. No murmurs, rubs, or gallops.  ABDOMEN: Soft, nontender, nondistended. Bowel sounds present. No organomegaly or mass.  EXTREMITIES: No pedal edema, cyanosis, or clubbing.  NEUROLOGIC: Cranial nerves II through XII are intact.  Muscle strength not checked. Sensation intact. Gait not checked.  PSYCHIATRIC: The patient is intubated and sedated SKIN: see below.      Labs/imaging that I havepersonally reviewed  (right click and "Reselect all SmartList Selections" daily)     Labs   CBC: Recent Labs  Lab Dec 14, 2021 1850 11/30/21 0357  WBC 12.5* 9.7  NEUTROABS 9.5*  --   HGB 15.3 17.7*  HCT 50.3 58.9*  MCV 88.4 89.4  PLT 216 228    Basic Metabolic Panel: Recent Labs  Lab December 14, 2021 1850 11/30/21 0357  NA 142 143  K 4.0 5.0  CL 108 111  CO2 25 24  GLUCOSE 132* 113*  BUN 16 19  CREATININE 0.68 0.92  CALCIUM 8.6* 8.9  MG 1.9 2.0  PHOS  --  4.8*   GFR: Estimated Creatinine Clearance: 82.6 mL/min (by C-G formula based on SCr of 0.92 mg/dL). Recent Labs  Lab December 14, 2021 1850  2021/12/14 2105 11/30/21 0357  PROCALCITON <0.10  --  3.95  WBC 12.5*  --  9.7  LATICACIDVEN 1.9 1.6 4.0*    Liver Function Tests: Recent Labs  Lab 2021/12/14 1850  AST 72*  ALT 33  ALKPHOS 84  BILITOT 0.9  PROT 6.4*  ALBUMIN 3.3*   No results for input(s): "LIPASE", "AMYLASE" in the last 168 hours. No results for input(s): "AMMONIA" in the last 168 hours.  ABG    Component Value Date/Time   PHART 7.25 (L) 11/30/2021 0607   PCO2ART 52 (H) 11/30/2021 0607   PO2ART 194 (H) 11/30/2021 0607   HCO3 22.8 11/30/2021 0607   ACIDBASEDEF 4.9 (H) 11/30/2021 0607   O2SAT 100 11/30/2021 0607     Coagulation Profile: Recent Labs  Lab 12/14/2021 1850  INR 1.0    Cardiac Enzymes: Recent Labs  Lab 12/14/2021 1850 11/30/21 0357  CKTOTAL 4,319* 4,989*    HbA1C: Hgb A1c MFr Bld  Date/Time Value Ref Range Status  14-Dec-2021 06:50 PM 5.6 4.8 - 5.6 % Final    Comment:    (NOTE) Pre diabetes:          5.7%-6.4%  Diabetes:              >  6.4%  Glycemic control for   <7.0% adults with diabetes     CBG: Recent Labs  Lab 12-10-21 2234 11/30/21 0354 11/30/21 0749  GLUCAP 155* 122* 105*    Review of Systems:   UNABLE TO OBTAIN PATIENT IS INTUBATED AND SEDATED  Past Medical History  He,  has no past medical history on file.   Surgical History   Unknown   Social History      Family History   His family history is not on file.   Allergies Not on File   Home Medications  Prior to Admission medications   Not on File    Scheduled Meds:   stroke: early stages of recovery book   Does not apply Once   budesonide (PULMICORT) nebulizer solution  0.25 mg Nebulization BID   Chlorhexidine Gluconate Cloth  6 each Topical Q0600   docusate  100 mg Per Tube BID   enoxaparin (LOVENOX) injection  40 mg Subcutaneous Q24H   insulin aspart  0-15 Units Subcutaneous Q4H   ipratropium-albuterol  3 mL Nebulization Q4H   polyethylene glycol  17 g Per Tube Daily   Continuous  Infusions:  ampicillin-sulbactam (UNASYN) IV     fentaNYL infusion INTRAVENOUS 75 mcg/hr (Dec 10, 2021 2255)   lactated ringers 150 mL/hr at 11/30/21 0817   propofol (DIPRIVAN) infusion 20 mcg/kg/min (11/30/21 0619)   PRN Meds:.docusate sodium, fentaNYL, ipratropium-albuterol, polyethylene glycol   Active Hospital Problem list   Acute hypoxic hypercapnic respiratory failure Sepsis Stroke Rhabdomyolysis Hypertension DM  Assessment & Plan:   Acute Hypoxic Hypercapnic Respiratory Failure in the setting of suspected Cocaine Toxicity, Aspiration  Event, Multifocal Pneumonia -Wean PEEP and FiO2 for sats greater than 90%. ARDSnet protocol.  -Plateau pressures less than 30 cm H20 -VAP bundle in place -Intermittent chest x-ray & ABG -Stop steroids today.  -PRN Duo-Nebs.  -Start CPT q6hr -wean sedation/analgesia for RASS goal 0  Severe sepsis secondary to aspiration pneumonia Meets SIRS Criteria  -Monitor fever curve -Daily CBC. Recheck lactic acid.  -Follow cultures as above. Check tracheal aspirate.  -Continue Unsayn for aspiration PNA for a total of 5 days.    Rhabdomyolysis likely in the setting of prolonged downtime -Trend CK -Monitor I&O's / urinary output -Follow BMP -Ensure adequate renal perfusion -Avoid nephrotoxic agents as able -Start 150 cc/hr LR  CVA PMHx: HTN, DM, Cocaine use -Initial CT head shows multiple acute to subacute infarct -repeat MRI head in a.m. -Check HgbA1c, fasting lipid panel -PT consult, OT consult, Speech consult -Echocardiogram -Frequent neuro checks -Wean fentanyl this AM -Await neurology recommendations   Acute Metabolic Encephalopathy  Likely multifactorial in the setting of +Cocaine abuse, + Multiple strokes as above -Provide supportive care -Assess EEG and repeat imaging as above -Wean sedation as able -Neuro consult as appropriate   Diabetes mellitus HgbA1c pending -CBG's AC & hs; Target range of 140 to 180 -SSI -Follow ICU  Hypo/Hyperglycemia protocol  Best practice:  Diet:  NPO Pain/Anxiety/Delirium protocol (if indicated): Yes (RASS goal 0) VAP protocol (if indicated): Yes DVT prophylaxis: LMWH GI prophylaxis: PPI Glucose control:  SSI Yes Central venous access:  N/A Arterial line:  Yes, and it is still needed Foley:  Yes, and it is still needed Mobility:  bed rest  PT consulted: N/A Last date of multidisciplinary goals of care discussion [6/20] Code Status:  full code Disposition: ICU  = Goals of Care = Code Status Order: FULL  Primary Emergency Contact: Tomassetti,Angela Family wishes to pursue full aggressive treatment and  intervention options, including CPR and intubation, but goals of care will be addressed on going with family if that should become necessary.  Critical care time: A total of 40 minutes of critical care time was spent in the care of this patient which does not include time spent performing procedures.

## 2021-11-30 NOTE — Progress Notes (Signed)
OT Cancellation Note  Patient Details Name: Clinton Knight MRN: [id number] DOB: 1961-02-28   Cancelled Treatment:    Reason Eval/Treat Not Completed: Patient not medically ready. Pt continues to have red MEWs score and remains intubated. OT is not able to actively participate in therapeutic intervention at this time. OT to SIGN OFF. Please reconsult when pt is medically able to participate with therapy.   Jackquline Denmark, MS, OTR/L , CBIS ascom [phone]  11/30/21, 2:17 PM

## 2021-11-30 NOTE — Progress Notes (Addendum)
To whom it may concern,  Please excuse Clinton Knight from work obligations June 21st-June 22nd, 2023 due to family emergency.

## 2021-11-30 NOTE — H&P (Addendum)
NAME:  Clinton Knight, MRN:  [id number], DOB:  01/19/1961, LOS: 1 ADMISSION DATE:  12/01/2021, CONSULTATION DATE: 11/12/2021 REFERRING MD: Delton Prairie MD, CHIEF COMPLAINT: Unresponsiveness   HPI  61 y.o  male with significant PMH of hypertension, DM and polysubstance abuse who presented to the ED with chief complaints of respiratory distress.  Per ED reports, EMS was called to patient's boarding house after being found down by housemate.  Per reports, patient's housemate had not heard from him since Friday however patient's daughter reports that she tried calling him Sunday afternoon and yesterday for Father's Day but he did not pick up.  Last time seen normal was Sunday morning per the patient's sister who claimed she was with the patient.  Per EMS report, patient's housemate busted door open and found him down with what appeared to be white substance on the floor.  On EMS arrival, patient was found to be hypoxic with sats in the 60s to 80s which slowly improved on nonrebreather.  ED Course: In the emergency department, the temperature was 36.4C, the heart rate 84 beats/minute, the blood pressure 146/99 mm Hg, the respiratory rate 26 breaths/minute, and the oxygen saturation 95% on NRB.  Patient remained unresponsive with gurgling respiration therefore he was intubated for airway protection.  Toxicology was positive for Cocaine.  Friends and family confirmed frequent use of  crack cocaine. Pertinent Labs in Red/Diagnostics Findings: Na+/ K+: 142/4.0 Glucose: 132 BUN/Cr.:  16/0.62 AST/ALT: 72/33   WBC/ TMAX: 5.5 afebrile PCT: negative <0.10 CK: 4319 Arterial Blood Gas result:  pO2 75; pCO2 51; pH 7.29;  HCO3 24.5, %O2 Sat 95.4.   Due to possible sepsis and significantly elevated CK,Patient given IV fluids and started on broad-spectrum antibiotics Vanco and cefepime. PCCM consulted.  Past Medical History  Polysubstance abuse (crack cocaine abuse) Hypertension Diabetes  Significant  Hospital Events   6/20: Admitted to the ICU with acute hypoxic hypercapnic respiratory failure in the setting of cocaine intoxication  Consults:  PCCM  Procedures:  6/20: Intubation  Significant Diagnostic Tests:  6/20: Noncontrast CT head>Hypodensity extending from the right caudate and bilateral thalami into the midbrain, pons, and left cerebellum with additional areas of hypodensity in the left parieto-occipital region and left inferior cerebellum, concerning for infarcts that are most likely acute to subacute 6/20: CTA Chest>Moderate severity bilateral multifocal infiltrates. 2. Marked severity left lower lobe consolidation  Micro Data:  6/20: SARS-CoV-2 PCR> negative 6/20: Blood culture x2> 6/20: Urine Culture> 6/20: MRSA PCR>>  6/20: Strep pneumo urinary antigen> 6/20: Legionella urinary antigen> 6/20: Tracheal aspirate>  Antimicrobials:  Vancomycin 6/20> Cefepime 6/20>  OBJECTIVE  Blood pressure (!) 153/100, pulse 87, temperature (!) 91.4 F (33 C), resp. rate (!) 22, height 5\' 8"  (1.727 m), weight 73.2 kg, SpO2 100 %.    Vent Mode: PRVC FiO2 (%):  [60 %-100 %] 60 % Set Rate:  [22 bmp] 22 bmp Vt Set:  [500 mL] 500 mL PEEP:  [10 cmH20] 10 cmH20 Plateau Pressure:  [33 cmH20] 33 cmH20   Intake/Output Summary (Last 24 hours) at 11/30/2021 0257 Last data filed at 11/26/2021 2109 Gross per 24 hour  Intake 99.79 ml  Output --  Net 99.79 ml   Filed Weights   11/17/2021 1848 11/13/2021 2248  Weight: 72.6 kg 73.2 kg   Physical Examination  GENERAL:61 year-old critically ill patient lying in the bed intubated and sedated EYES: Pupils equal, round, minimally reactive to light and accommodation on the right. Unable to asses  the left due to periorbital edema.  No scleral icterus. Extraocular muscles not checked HEENT: Head traumatic see below, normocephalic. Oropharynx and nasopharynx with tan secretions  NECK:  Supple, no jugular venous distention. No thyroid enlargement, no  tenderness.  LUNGS: Decreased breath sounds bilaterally, no wheezing, moderate rales, and rhonchi. No use of accessory muscles of respiration.  CARDIOVASCULAR: S1, S2 normal. No murmurs, rubs, or gallops.  ABDOMEN: Soft, nontender, nondistended. Bowel sounds present. No organomegaly or mass.  EXTREMITIES: No pedal edema, cyanosis, or clubbing.  NEUROLOGIC: Cranial nerves II through XII are intact.  Muscle strength not checked. Sensation intact. Gait not checked.  PSYCHIATRIC: The patient is intubated and sedated SKIN: see below.      Labs/imaging that I havepersonally reviewed  (right click and "Reselect all SmartList Selections" daily)     Labs   CBC: Recent Labs  Lab 11/15/2021 1850  WBC 12.5*  NEUTROABS 9.5*  HGB 15.3  HCT 50.3  MCV 88.4  PLT 216    Basic Metabolic Panel: Recent Labs  Lab 12/05/2021 1850  NA 142  K 4.0  CL 108  CO2 25  GLUCOSE 132*  BUN 16  CREATININE 0.68  CALCIUM 8.6*  MG 1.9   GFR: Estimated Creatinine Clearance: 95 mL/min (by C-G formula based on SCr of 0.68 mg/dL). Recent Labs  Lab 11/25/2021 1850 11/27/2021 2105  PROCALCITON <0.10  --   WBC 12.5*  --   LATICACIDVEN 1.9 1.6    Liver Function Tests: Recent Labs  Lab 11/20/2021 1850  AST 72*  ALT 33  ALKPHOS 84  BILITOT 0.9  PROT 6.4*  ALBUMIN 3.3*   No results for input(s): "LIPASE", "AMYLASE" in the last 168 hours. No results for input(s): "AMMONIA" in the last 168 hours.  ABG    Component Value Date/Time   PHART 7.29 (L) 12/03/2021 2012   PCO2ART 51 (H) 11/28/2021 2012   PO2ART 75 (L) 12/04/2021 2012   HCO3 24.5 11/15/2021 2012   ACIDBASEDEF 2.7 (H) 11/13/2021 2012   O2SAT 95.4 12/07/2021 2012     Coagulation Profile: Recent Labs  Lab 11/28/2021 1850  INR 1.0    Cardiac Enzymes: Recent Labs  Lab 11/11/2021 1850  CKTOTAL 4,319*    HbA1C: No results found for: "HGBA1C"  CBG: Recent Labs  Lab 12/09/2021 2234  GLUCAP 155*    Review of Systems:   UNABLE TO  OBTAIN PATIENT IS INTUBATED AND SEDATED  Past Medical History  He,  has no past medical history on file.   Surgical History   Unknown   Social History      Family History   His family history is not on file.   Allergies Not on File   Home Medications  Prior to Admission medications   Not on File    Scheduled Meds:   stroke: early stages of recovery book   Does not apply Once   budesonide (PULMICORT) nebulizer solution  0.25 mg Nebulization BID   Chlorhexidine Gluconate Cloth  6 each Topical Q0600   docusate  100 mg Per Tube BID   enoxaparin (LOVENOX) injection  40 mg Subcutaneous Q24H   insulin aspart  0-15 Units Subcutaneous Q4H   ipratropium-albuterol  3 mL Nebulization Q4H   polyethylene glycol  17 g Per Tube Daily   Continuous Infusions:  fentaNYL infusion INTRAVENOUS 75 mcg/hr (12/03/2021 2255)   propofol (DIPRIVAN) infusion 20 mcg/kg/min (11/10/2021 2314)   PRN Meds:.docusate sodium, fentaNYL, ipratropium-albuterol, polyethylene glycol   Active Hospital Problem list  Acute hypoxic hypercapnic respiratory failure Sepsis Stroke Rhabdomyolysis Hypertension DM  Assessment & Plan:  Acute Hypoxic Hypercapnic Respiratory Failure in the setting of suspected Cocaine Toxicity, Aspiration  Event, Multifocal Pneumonia -Wean PEEP and FiO2 for sats greater than 90% -Plateau pressures less than 30 cm H20 -VAP bundle in place -Intermittent chest x-ray & ABG -Steroids initiated: solu-medrol 40 mg daily -Budesonide nebs BID, bronchodilators PRN -wean sedation/analgesia for RASS goal 0  Sepsis in the setting of suspected Aspiration Meets SIRS Criteria  -Monitor fever curve -Trend WBC's & Procalcitonin -Follow cultures as above -Continue empiric abx pending cultures & sensitivities   Rhabdomyolysis likely in the setting of prolonged downtime -Trend CK -Monitor I&O's / urinary output -Follow BMP -Ensure adequate renal perfusion -Avoid nephrotoxic agents as  able  CVA PMHx: HTN, DM, Cocaine use -Initial CT head shows multiple acute to subacute infarct -repeat MRI head in a.m. -Check HgbA1c, fasting lipid panel -PT consult, OT consult, Speech consult -Echocardiogram -Frequent neuro checks -Neurology consult placed    Acute Metabolic Encephalopathy  Likely multifactorial in the setting of +Cocaine abuse, + Multiple strokes as above -Provide supportive care -Assess EEG and repeat imaging as above -Wean sedation as able -Neuro consult as appropriate   Diabetes mellitus HgbA1c pending -CBG's AC & hs; Target range of 140 to 180 -SSI -Follow ICU Hypo/Hyperglycemia protocol   Best practice:  Diet:  NPO Pain/Anxiety/Delirium protocol (if indicated): Yes (RASS goal 0) VAP protocol (if indicated): Yes DVT prophylaxis: LMWH GI prophylaxis: PPI Glucose control:  SSI Yes Central venous access:  N/A Arterial line:  Yes, and it is still needed Foley:  Yes, and it is still needed Mobility:  bed rest  PT consulted: N/A Last date of multidisciplinary goals of care discussion [6/20] Code Status:  full code Disposition: ICU   = Goals of Care = Code Status Order: FULL   Family wishes to pursue full aggressive treatment and intervention options, including CPR and intubation, but goals of care will be addressed on going with family if that should become necessary.  Critical care time: 45 minutes     [name], DNP, CCRN, FNP-C, AGACNP-BC Acute Care & Family Nurse Practitioner  [facility]  See Amion for personal pager PCCM on call pager [phone] until 7 am

## 2021-11-30 NOTE — Progress Notes (Signed)
OT Cancellation Note  Patient Details Name: Clinton Knight MRN: [id number] DOB: [birth year]   Cancelled Treatment:    Reason Eval/Treat Not Completed: Medical issues which prohibited therapy. OT order received and chart reviewed. Pt with red MEWS and score of 7 this morning with fever of 102.4. OT to hold this morning and will re-attempt as pt is able to participate.   Jackquline Denmark, MS, OTR/L , CBIS ascom [phone]  11/30/21, 8:44 AM

## 2021-11-30 NOTE — Plan of Care (Signed)
The neurology team reviewed the Clinton Knight's neurologic injury with his daughter, sister and brother at the bedside. After explaining the [name]'s overall poor prognosis and detailing that he would not be able to survive without a tracheostomy and PEG tube, the patient's daughter [name]) elected to pursue compassionate extubation and comfort care measures. She emphasized that her father would never accept this level of debility and states that he was fiercely independent.   We discussed transitioning to DNR at this time. His family will come to visit. The family will let us know when they are ready for [place] to perform a compassionate extubation.   An opportunity to ask questions was provided and all questions were answered.

## 2021-11-30 NOTE — Progress Notes (Signed)
PHARMACY CONSULT NOTE - FOLLOW UP  Pharmacy Consult for Electrolyte Monitoring and Replacement   Recent Labs: Potassium (mmol/L)  Date Value  11/30/2021 5.0   Magnesium (mg/dL)  Date Value  09/81/1914 2.0   Calcium (mg/dL)  Date Value  78/29/5621 8.9   Albumin (g/dL)  Date Value  30/86/5784 3.3 (L)   Phosphorus (mg/dL)  Date Value  69/62/9528 4.8 (H)   Sodium (mmol/L)  Date Value  11/30/2021 143    Assessment: 61 year old male presenting with respiratory distress. Past medical history includes hypertension, DM, and polysubstance abuse. Admitted with suspected cocaine toxicity, aspiration event with multifocal pneumonia on day 1 of Unasyn, possible CVA per CT, and rhabdomyolysis (CK > 4,000) after being found down. Patient is ventilated and sedated on propofol and fentanyl infusions.   IVF: Lactated ringers @100  mL/hr   Goal of Therapy:  Electrolytes within normal limits  Plan:  No replacement warranted.  Continue IVF for rhabdomyolysis.  Follow up AM labs.   , PharmD Pharmacy Resident  11/30/2021 7:30 AM

## 2021-11-30 NOTE — Progress Notes (Signed)
Pt pupils not equal or reactive. Pt completely unresponsive on WUA. Breathing became more labored  when sedation turned off but no reaction to physical or verbal stimuli. Pt does have gag reflex. Re-sedated for WOB and pending MRI. NIH limited by unresponsiveness.

## 2021-11-30 NOTE — Progress Notes (Addendum)
Initial Nutrition Assessment  DOCUMENTATION CODES:   Not applicable  INTERVENTION:   Pivot 1.5@55ml /hr- Initiate at 51ml/hr and increase by 62ml/hr q 8 hours until goal rate is reached.   Propofol: 8.71 ml/hr- provides 230kcal/day   Free water flushes 42ml q4 hours to maintain tube patency   Regimen 2210kal/day, 124g/day protein and 1185ml/day of free water.   Pt at high refeed risk; recommend monitor potassium, magnesium and phosphorus labs daily until stable  Juven Fruit Punch BID via tube, each serving provides 95kcal and 2.5g of protein (amino acids glutamine and arginine)  NUTRITION DIAGNOSIS:   Inadequate oral intake related to inability to eat (pt sedated and ventilated) as evidenced by NPO status.  GOAL:   Provide needs based on ASPEN/SCCM guidelines  MONITOR:   Vent status, Labs, Weight trends, Skin, I & O's, TF tolerance  REASON FOR ASSESSMENT:   Ventilator    ASSESSMENT:   61 y/o male with h/o DM, CVA and substance abuse who is admitted with OD, rhabdomyolysis, PNA and sepsis  Pt sedated and ventilated. OGT in place. Will plan to start tube feeds after MRI today. Suspect pt is at refeed risk. There is no documented weight history chart to determine if any significant recent changes.   Medications reviewed and include: colace, lovenox, insulin, protonix, miralax, unasyn, LRS @150ml /hr, propofol   Labs reviewed: K 5.0 wnl, P 4.8(H), Mg 2.0 wnl Cbgs- 105, 122 x 24 hrs AIC 5.6- 6/20  Patient is currently intubated on ventilator support MV: 10.4 L/min Temp (24hrs), Avg:95.7 F (35.4 C), Min:91.1 F (32.8 C), Max:102.4 F (39.1 C)  Propofol: 8.71 ml/hr- provides 230kcal/day   MAP- >68mmHg   UOP- 77m   NUTRITION - FOCUSED PHYSICAL EXAM:  Flowsheet Row Most Recent Value  Orbital Region No depletion  Upper Arm Region No depletion  Thoracic and Lumbar Region No depletion  Buccal Region No depletion  Temple Region No depletion  Clavicle Bone  Region No depletion  Clavicle and Acromion Bone Region No depletion  Scapular Bone Region No depletion  Dorsal Hand No depletion  Patellar Region Moderate depletion  Anterior Thigh Region Mild depletion  Posterior Calf Region Mild depletion  Edema (RD Assessment) None  Hair Reviewed  Eyes Reviewed  Mouth Reviewed  Skin Reviewed  Nails Reviewed   Diet Order:   Diet Order             Diet NPO time specified  Diet effective now                  EDUCATION NEEDS:   No education needs have been identified at this time  Skin:  Skin Assessment: Reviewed RN Assessment (ecchymosis, wound chest, periorbital bruising and swelling)  Last BM:  pta  Height:   Ht Readings from Last 1 Encounters:  18-Dec-2021 5\' 8"  (1.727 m)    Weight:   Wt Readings from Last 1 Encounters:  11/30/21 73.8 kg    Ideal Body Weight:  70 kg  BMI:  Body mass index is 24.74 kg/m.  Estimated Nutritional Needs:   Kcal:  2089kcal/day  Protein:  105-120g/day  Fluid:  2.1-2.4L/day  11/20/2021 MS, RD, LDN Please refer to [facility] for RD and/or RD on-call/weekend/after hours pager

## 2021-11-30 NOTE — Progress Notes (Signed)
Pharmacy Antibiotic Note  Clinton Knight is a 61 y.o. male admitted on 2021/12/05 with  aspiration PNA .  Pharmacy has been consulted for Unasyn dosing.  Plan: Unasyn 3 gm IV Q6H ordered to start on 6/21 @ 0700.   Height: 5\' 8"  (172.7 cm) Weight: 73.8 kg (162 lb 11.2 oz) IBW/kg (Calculated) : 68.4  Temp (24hrs), Avg:92.8 F (33.8 C), Min:91.1 F (32.8 C), Max:97.6 F (36.4 C)  Recent Labs  Lab 2021/12/05 1850 Dec 05, 2021 2105 11/30/21 0357  WBC 12.5*  --  9.7  CREATININE 0.68  --  0.92  LATICACIDVEN 1.9 1.6 4.0*    Estimated Creatinine Clearance: 82.6 mL/min (by C-G formula based on SCr of 0.92 mg/dL).    Not on File  Antimicrobials this admission:   >>    >>   Dose adjustments this admission:   Microbiology results:  BCx:   UCx:    Sputum:    MRSA PCR:   Thank you for allowing pharmacy to be a part of this patient's care.  Teagon Kron D 11/30/2021 6:32 AM

## 2021-11-30 NOTE — Progress Notes (Signed)
Pt was transported to MRI from CCU and back while on the vent. ?

## 2021-11-30 NOTE — Progress Notes (Signed)
SLP Cancellation Note  Patient Details Name: Clinton Knight MRN: [id number] DOB: 01/29/1961   Cancelled treatment:       Reason Eval/Treat Not Completed: Medical issues which prohibited therapy;Patient not medically ready (chart reviewed; consulted NSG re: pt's status currently.) Pt admitted w/ respiratory distress, unresponsive w/ profound toxic metabolic encephalopathy in the setting of cocaine use. He is orally intubated on full vent support, sedated per NSG.  ST services can be available for assessment when pt is medically appropriate. NSG updated, agreed.     Jerilynn Som, MS, CCC-SLP Speech Language Pathologist Rehab Services; [facility] [phone] (ascom) Orit Sanville 11/30/2021, 8:27 AM

## 2021-12-01 ENCOUNTER — Inpatient Hospital Stay: Payer: Medicaid Other

## 2021-12-01 LAB — URINALYSIS, COMPLETE (UACMP) WITH MICROSCOPIC
Bacteria, UA: NONE SEEN
Bilirubin Urine: NEGATIVE
Glucose, UA: NEGATIVE mg/dL
Ketones, ur: 5 mg/dL — AB
Leukocytes,Ua: NEGATIVE
Nitrite: NEGATIVE
Protein, ur: 30 mg/dL — AB
Specific Gravity, Urine: 1.035 — ABNORMAL HIGH (ref 1.005–1.030)
pH: 5 (ref 5.0–8.0)

## 2021-12-01 LAB — CBC
HCT: 40.9 % (ref 39.0–52.0)
HCT: 44.7 % (ref 39.0–52.0)
Hemoglobin: 12.4 g/dL — ABNORMAL LOW (ref 13.0–17.0)
Hemoglobin: 13.8 g/dL (ref 13.0–17.0)
MCH: 27.1 pg (ref 26.0–34.0)
MCH: 27.3 pg (ref 26.0–34.0)
MCHC: 30.3 g/dL (ref 30.0–36.0)
MCHC: 30.9 g/dL (ref 30.0–36.0)
MCV: 87.8 fL (ref 80.0–100.0)
MCV: 90.1 fL (ref 80.0–100.0)
Platelets: 169 10*3/uL (ref 150–400)
Platelets: 181 10*3/uL (ref 150–400)
RBC: 4.54 MIL/uL (ref 4.22–5.81)
RBC: 5.09 MIL/uL (ref 4.22–5.81)
RDW: 14.7 % (ref 11.5–15.5)
RDW: 14.9 % (ref 11.5–15.5)
WBC: 12.7 10*3/uL — ABNORMAL HIGH (ref 4.0–10.5)
WBC: 13 10*3/uL — ABNORMAL HIGH (ref 4.0–10.5)
nRBC: 0 % (ref 0.0–0.2)
nRBC: 0 % (ref 0.0–0.2)

## 2021-12-01 LAB — COMPREHENSIVE METABOLIC PANEL
ALT: 27 U/L (ref 0–44)
ALT: 25 U/L (ref 0–44)
ALT: 25 U/L (ref 0–44)
ALT: 25 U/L (ref 0–44)
AST: 41 U/L (ref 15–41)
AST: 39 U/L (ref 15–41)
AST: 40 U/L (ref 15–41)
AST: 35 U/L (ref 15–41)
Albumin: 2.5 g/dL — ABNORMAL LOW (ref 3.5–5.0)
Albumin: 2.4 g/dL — ABNORMAL LOW (ref 3.5–5.0)
Albumin: 2.2 g/dL — ABNORMAL LOW (ref 3.5–5.0)
Albumin: 2.2 g/dL — ABNORMAL LOW (ref 3.5–5.0)
Alkaline Phosphatase: 66 U/L (ref 38–126)
Alkaline Phosphatase: 62 U/L (ref 38–126)
Alkaline Phosphatase: 62 U/L (ref 38–126)
Alkaline Phosphatase: 59 U/L (ref 38–126)
Anion gap: 6 (ref 5–15)
Anion gap: 3 — ABNORMAL LOW (ref 5–15)
Anion gap: 6 (ref 5–15)
Anion gap: 4 — ABNORMAL LOW (ref 5–15)
BUN: 25 mg/dL — ABNORMAL HIGH (ref 6–20)
BUN: 25 mg/dL — ABNORMAL HIGH (ref 6–20)
BUN: 24 mg/dL — ABNORMAL HIGH (ref 6–20)
BUN: 25 mg/dL — ABNORMAL HIGH (ref 6–20)
CO2: 23 mmol/L (ref 22–32)
CO2: 25 mmol/L (ref 22–32)
CO2: 25 mmol/L (ref 22–32)
CO2: 27 mmol/L (ref 22–32)
Calcium: 8.3 mg/dL — ABNORMAL LOW (ref 8.9–10.3)
Calcium: 8.2 mg/dL — ABNORMAL LOW (ref 8.9–10.3)
Calcium: 8.2 mg/dL — ABNORMAL LOW (ref 8.9–10.3)
Calcium: 8.1 mg/dL — ABNORMAL LOW (ref 8.9–10.3)
Chloride: 111 mmol/L (ref 98–111)
Chloride: 111 mmol/L (ref 98–111)
Chloride: 109 mmol/L (ref 98–111)
Chloride: 109 mmol/L (ref 98–111)
Creatinine, Ser: 1.17 mg/dL (ref 0.61–1.24)
Creatinine, Ser: 1.07 mg/dL (ref 0.61–1.24)
Creatinine, Ser: 1.02 mg/dL (ref 0.61–1.24)
Creatinine, Ser: 0.98 mg/dL (ref 0.61–1.24)
GFR, Estimated: >60 mL/min (ref >60)
GFR, Estimated: >60 mL/min (ref >60)
GFR, Estimated: >60 mL/min (ref >60)
GFR, Estimated: >60 mL/min (ref >60)
Glucose, Bld: 125 mg/dL — ABNORMAL HIGH (ref 70–99)
Glucose, Bld: 175 mg/dL — ABNORMAL HIGH (ref 70–99)
Glucose, Bld: 129 mg/dL — ABNORMAL HIGH (ref 70–99)
Glucose, Bld: 149 mg/dL — ABNORMAL HIGH (ref 70–99)
Potassium: 4.3 mmol/L (ref 3.5–5.1)
Potassium: 3.9 mmol/L (ref 3.5–5.1)
Potassium: 4 mmol/L (ref 3.5–5.1)
Potassium: 3.9 mmol/L (ref 3.5–5.1)
Sodium: 140 mmol/L (ref 135–145)
Sodium: 139 mmol/L (ref 135–145)
Sodium: 140 mmol/L (ref 135–145)
Sodium: 140 mmol/L (ref 135–145)
Total Bilirubin: 0.6 mg/dL (ref 0.3–1.2)
Total Bilirubin: 0.4 mg/dL (ref 0.3–1.2)
Total Bilirubin: 0.4 mg/dL (ref 0.3–1.2)
Total Bilirubin: 0.2 mg/dL — ABNORMAL LOW (ref 0.3–1.2)
Total Protein: 5.8 g/dL — ABNORMAL LOW (ref 6.5–8.1)
Total Protein: 5.4 g/dL — ABNORMAL LOW (ref 6.5–8.1)
Total Protein: 5.3 g/dL — ABNORMAL LOW (ref 6.5–8.1)
Total Protein: 5 g/dL — ABNORMAL LOW (ref 6.5–8.1)

## 2021-12-01 LAB — BLOOD GAS, ARTERIAL
Acid-Base Excess: 0 mmol/L (ref 0.0–2.0)
Acid-Base Excess: 0 mmol/L (ref 0.0–2.0)
Acid-Base Excess: 1.7 mmol/L (ref 0.0–2.0)
Acid-Base Excess: 2 mmol/L (ref 0.0–2.0)
Acid-Base Excess: 4.3 mmol/L — ABNORMAL HIGH (ref 0.0–2.0)
Bicarbonate: 25.4 mmol/L (ref 20.0–28.0)
Bicarbonate: 25.4 mmol/L (ref 20.0–28.0)
Bicarbonate: 27.3 mmol/L (ref 20.0–28.0)
Bicarbonate: 27.7 mmol/L (ref 20.0–28.0)
Bicarbonate: 29.8 mmol/L — ABNORMAL HIGH (ref 20.0–28.0)
FIO2: 100 %
FIO2: 100 %
FIO2: 100 %
FIO2: 100 %
FIO2: 50 %
MECHVT: 500 mL
MECHVT: 500 mL
MECHVT: 550 mL
MECHVT: 550 mL
Mechanical Rate: 18
O2 Saturation: 100 %
O2 Saturation: 100 %
O2 Saturation: 100 %
O2 Saturation: 100 %
O2 Saturation: 99.7 %
PEEP: 10 cmH2O
PEEP: 5 cmH2O
PEEP: 5 cmH2O
PEEP: 5 cmH2O
PEEP: 5 cmH2O
Patient temperature: 37
Patient temperature: 37
Patient temperature: 37
Patient temperature: 37
Patient temperature: 37
RATE: 18 resp/min
RATE: 22 resp/min
RATE: 22 resp/min
pCO2 arterial: 43 mmHg (ref 32–48)
pCO2 arterial: 43 mmHg (ref 32–48)
pCO2 arterial: 44 mmHg (ref 32–48)
pCO2 arterial: 47 mmHg (ref 32–48)
pCO2 arterial: 48 mmHg (ref 32–48)
pH, Arterial: 7.37 (ref 7.35–7.45)
pH, Arterial: 7.38 (ref 7.35–7.45)
pH, Arterial: 7.38 (ref 7.35–7.45)
pH, Arterial: 7.4 (ref 7.35–7.45)
pH, Arterial: 7.41 (ref 7.35–7.45)
pO2, Arterial: 178 mmHg — ABNORMAL HIGH (ref 83–108)
pO2, Arterial: 272 mmHg — ABNORMAL HIGH (ref 83–108)
pO2, Arterial: 355 mmHg — ABNORMAL HIGH (ref 83–108)
pO2, Arterial: 386 mmHg — ABNORMAL HIGH (ref 83–108)
pO2, Arterial: 387 mmHg — ABNORMAL HIGH (ref 83–108)

## 2021-12-01 LAB — BASIC METABOLIC PANEL
Anion gap: 4 — ABNORMAL LOW (ref 5–15)
BUN: 22 mg/dL — ABNORMAL HIGH (ref 6–20)
CO2: 25 mmol/L (ref 22–32)
Calcium: 8.2 mg/dL — ABNORMAL LOW (ref 8.9–10.3)
Chloride: 111 mmol/L (ref 98–111)
Creatinine, Ser: 1.08 mg/dL (ref 0.61–1.24)
GFR, Estimated: >60 mL/min (ref >60)
Glucose, Bld: 176 mg/dL — ABNORMAL HIGH (ref 70–99)
Potassium: 3.9 mmol/L (ref 3.5–5.1)
Sodium: 140 mmol/L (ref 135–145)

## 2021-12-01 LAB — PROTIME-INR
INR: 1.1 (ref 0.8–1.2)
INR: 1.1 (ref 0.8–1.2)
INR: 1.1 (ref 0.8–1.2)
INR: 1 (ref 0.8–1.2)
Prothrombin Time: 14.6 seconds (ref 11.4–15.2)
Prothrombin Time: 14.5 seconds (ref 11.4–15.2)
Prothrombin Time: 14 seconds (ref 11.4–15.2)
Prothrombin Time: 13.4 seconds (ref 11.4–15.2)

## 2021-12-01 LAB — MAGNESIUM
Magnesium: 1.9 mg/dL (ref 1.7–2.4)
Magnesium: 2 mg/dL (ref 1.7–2.4)
Magnesium: 2 mg/dL (ref 1.7–2.4)
Magnesium: 1.9 mg/dL (ref 1.7–2.4)
Magnesium: 1.9 mg/dL (ref 1.7–2.4)

## 2021-12-01 LAB — PHOSPHORUS
Phosphorus: 3.2 mg/dL (ref 2.5–4.6)
Phosphorus: 2.6 mg/dL (ref 2.5–4.6)
Phosphorus: 2.6 mg/dL (ref 2.5–4.6)
Phosphorus: 3.2 mg/dL (ref 2.5–4.6)
Phosphorus: 2.4 mg/dL — ABNORMAL LOW (ref 2.5–4.6)

## 2021-12-01 LAB — GLUCOSE, CAPILLARY
Glucose-Capillary: 75 mg/dL (ref 70–99)
Glucose-Capillary: 112 mg/dL — ABNORMAL HIGH (ref 70–99)
Glucose-Capillary: 90 mg/dL (ref 70–99)
Glucose-Capillary: 120 mg/dL — ABNORMAL HIGH (ref 70–99)
Glucose-Capillary: 140 mg/dL — ABNORMAL HIGH (ref 70–99)
Glucose-Capillary: 130 mg/dL — ABNORMAL HIGH (ref 70–99)

## 2021-12-01 LAB — ABO/RH
ABO/RH(D): A POS
PT AG Type: POSITIVE

## 2021-12-01 LAB — LACTIC ACID, PLASMA: Lactic Acid, Venous: 1.4 mmol/L (ref 0.5–1.9)

## 2021-12-01 LAB — BILIRUBIN, DIRECT
Bilirubin, Direct: 0.3 mg/dL — ABNORMAL HIGH (ref 0.0–0.2)
Bilirubin, Direct: 0.2 mg/dL (ref 0.0–0.2)
Bilirubin, Direct: 0.1 mg/dL (ref 0.0–0.2)
Bilirubin, Direct: 0.1 mg/dL (ref 0.0–0.2)

## 2021-12-01 LAB — APTT
aPTT: 29 seconds (ref 24–36)
aPTT: 29 seconds (ref 24–36)
aPTT: 28 seconds (ref 24–36)
aPTT: 30 seconds (ref 24–36)

## 2021-12-01 LAB — PROCALCITONIN: Procalcitonin: 6.04 ng/mL

## 2021-12-01 LAB — CK: Total CK: 1369 U/L — ABNORMAL HIGH (ref 49–397)

## 2021-12-01 NOTE — Progress Notes (Signed)
Placed patient back on previous vent settings peep 10, 30% after abg collection from aline. Patient tolerated interventions well.

## 2021-12-01 NOTE — Progress Notes (Addendum)
PHARMACY CONSULT NOTE - FOLLOW UP  Pharmacy Consult for Electrolyte Monitoring and Replacement   Recent Labs: Potassium (mmol/L)  Date Value  12/01/2021 3.9  12/01/2021 3.9   Magnesium (mg/dL)  Date Value  56/81/2751 2.0  12/01/2021 2.0   Calcium (mg/dL)  Date Value  70/06/7492 8.2 (L)  12/01/2021 8.2 (L)   Albumin (g/dL)  Date Value  49/67/5916 2.4 (L)   Phosphorus (mg/dL)  Date Value  38/46/6599 2.6  12/01/2021 2.6   Sodium (mmol/L)  Date Value  12/01/2021 140  12/01/2021 139   Corrected calcium: 9.48 mg/dL  Assessment: 61 year old male presenting with respiratory distress. Past medical history includes hypertension, DM, and polysubstance abuse. Admitted with suspected cocaine toxicity, aspiration event with multifocal pneumonia on day 3 (of 5 days) of Unasyn, CVA with massive posterior circulation infarcts, and rhabdomyolysis (CK > 4,000) after being found down. Patient is ventilated and sedated on propofol and fentanyl infusions.   IVF: Lactated ringers @150  mL/hr  I&O +1.3 L  Goal of Therapy:  Electrolytes within normal limits  Plan: Patient is now comfort care. Pharmacy will sign off on electrolyte replacement.  , PharmD Pharmacy Resident  12/01/2021 7:36 AM

## 2021-12-01 NOTE — Progress Notes (Addendum)
This RN contacted by [facility] RN regarding change to donation surgery scheduled for later today, now possibly rescheduled to tomorrow pending ID labs per [employer]. Elink RN asked for Harlow Ohms to reach out to Medical Examiner on call today for an update. His contact information is detailed below.   The Medical examiner on call for today is Tenna Delaine [phone] per [facility] RN Adela Lank.   Adela Lank also clarified patient is not brain dead. Patient a DCD at this time (Donation after Circulatory Death).

## 2021-12-01 NOTE — Procedures (Signed)
Diagnostic Bronchoscopy Procedure Note   Clinton Knight  [id number]  01/26/1961  Date:12/01/21  Time:11:04 AM   Provider Performing:Najee Manninen A Zaul Hubers  Procedure: Diagnostic Bronchoscopy (69629)  Indication(s) Assist with direct visualization of tracheostomy placement  Consent Risks of the procedure as well as the alternatives and risks of each were explained to the patient and/or caregiver.  Consent for the procedure was obtained.  Anesthesia See separate tracheostomy note   Time Out Verified patient identification, verified procedure, site/side was marked, verified correct patient position, special equipment/implants available, medications/allergies/relevant history reviewed, required imaging and test results available.   Sterile Technique Usual hand hygiene, masks, gowns, and gloves were used   Procedure Description Bronchoscope advanced through endotracheal tube and into airway.  After suctioning out tracheal secretions, bronchoscope used to provide direct visualization of tracheostomy placement. The airway was examined and was found to be grossly normal. 100 cc instilled in the anterior segment of the RUL. 40 cc was returned and split into two samples. The bronchoscope was then withdrawn from the airway without issue.   Complications/Tolerance None; patient tolerated the procedure well..   EBL None   Specimen(s) None

## 2021-12-01 NOTE — Progress Notes (Addendum)
[place] Day representative will be [employer] per [employer] -->Organ Donor Coordinator:  Harlow Ohms [phone]  Misty Stanley: [phone]

## 2021-12-01 NOTE — Progress Notes (Signed)
This RN spoke with RT regarding bronchoscopy. RT informed me bronch will be at 1145 this morning. MD Mahmood notified this RN he does not want 02 recruitment performed, information relayed to RT from this RN. RT previously aware from MD conversation.

## 2021-12-01 NOTE — Progress Notes (Addendum)
Per [employer] Family Representative: OR time for 1300 Friday 11/10/2021. ICU CN updated on plan.

## 2021-12-01 NOTE — Progress Notes (Signed)
Nutrition Brief Note  Chart reviewed. Case discussed with RN, MD, and during ICU rounds. Plan for bronch this AM and compassionate extubation in OR after organ donations. TF currently on hold for this reason. Pt now transitioning to comfort care with plans for organ donations.  No further nutrition interventions planned at this time.  Please re-consult as needed.   Levada Schilling, RD, LDN, CDCES Registered Dietitian II Certified Diabetes Care and Education Specialist Please refer to [facility] for RD and/or RD on-call/weekend/after hours pager

## 2021-12-01 NOTE — Progress Notes (Signed)
RN contacted by patient's sister Marylene Land regarding visitation. Sister requested no visitors besides herself and patient's daughter Maryan Char. ICU NS updated by this RN about visitation changes and NS informed visitor's desk downstairs.

## 2021-12-01 NOTE — Progress Notes (Addendum)
Hannah RRT and Lavenia Stumpo RRT assisted in bronchoscopy procedure. BAL performed and sample collected and hand delivered to lab by Amonte Brookover RRT. Ambu bronchoscope used in procedure. Lot [id number] and YQMG [id number].   FIO2 increased to 100% for procedure.  No adverse reactions noted. FiO2 left at 100% and PEEP decreased to 5 for Oxygen Challenge per donor services.

## 2021-12-01 NOTE — Progress Notes (Signed)
Patient received 3 boluses of fentanyl, each, for Bronchoscopy procedure at bedside by verbal order from MD [facility]. Total Fentanyl boluses .

## 2021-12-01 NOTE — Progress Notes (Signed)
Peep decreased to 5. O2 increased to 100% Abg to follow.

## 2021-12-01 NOTE — Progress Notes (Signed)
NAME:  Clinton Knight, MRN:  [id number], DOB:  09-27-1960, LOS: 2 ADMISSION DATE:  11/28/2021, CONSULTATION DATE: 12/09/2021 REFERRING MD: Vladimir Crofts MD, CHIEF COMPLAINT: Unresponsiveness   HPI  61 y.o  male with significant PMH of hypertension, DM and polysubstance abuse who presented to the ED with chief complaints of respiratory distress.  Per ED reports, EMS was called to patient's boarding house after being found down by housemate.  Per reports, patient's housemate had not heard from him since Friday however patient's daughter reports that she tried calling him Sunday afternoon and yesterday for Father's Day but he did not pick up.  Last time seen normal was Sunday morning per the patient's sister who claimed she was with the patient.  Per EMS report, patient's housemate busted door open and found him down with what appeared to be white substance on the floor.  On EMS arrival, patient was found to be hypoxic with sats in the 60s to 80s which slowly improved on nonrebreather.  ED Course: In the emergency department, the temperature was 36.4C, the heart rate 84 beats/minute, the blood pressure 146/99 mm Hg, the respiratory rate 26 breaths/minute, and the oxygen saturation 95% on NRB.  Patient remained unresponsive with gurgling respiration therefore he was intubated for airway protection.  Toxicology was positive for Cocaine.  Friends and family confirmed frequent use of  crack cocaine.  Pertinent Labs in Red/Diagnostics Findings: Na+/ K+: 142/4.0 Glucose: 132 BUN/Cr.:  16/0.62 AST/ALT: 72/33   WBC/ TMAX: 5.5 afebrile PCT: negative <0.10 CK: 4319 Arterial Blood Gas result:  pO2 75; pCO2 51; pH 7.29;  HCO3 24.5, %O2 Sat 95.4.   Due to possible sepsis and significantly elevated CK,Patient given IV fluids and started on broad-spectrum antibiotics Vanco and cefepime. PCCM consulted.  Significant Hospital Events: Including procedures, antibiotic start and stop dates in addition to other  pertinent events   Started Unasyn 6/20- Intubated 6/20 MRI demonstrates debilitating posterior circulation stroke. Discussed prognosis with family who would like to pursue comfort care.   Interim History / Subjective:  -No acute events overnight -No reported fevers -Down to 30% FiO2 with a PEEP of 5. -Net positive for the admission 3.2 L. -CK is downtrending to 1300   Past Medical History  Polysubstance abuse (crack cocaine abuse) Hypertension Diabetes  Significant Hospital Events   6/20: Admitted to the ICU with acute hypoxic hypercapnic respiratory failure in the setting of cocaine intoxication 6/20: Ventilator settings are now down to 50% FiO2 and PEEP of 10  Consults:  PCCM  Procedures:  6/20: Intubation  Significant Diagnostic Tests:  6/20: Noncontrast CT head>Hypodensity extending from the right caudate and bilateral thalami into the midbrain, pons, and left cerebellum with additional areas of hypodensity in the left parieto-occipital region and left inferior cerebellum, concerning for infarcts that are most likely acute to subacute 6/20: CTA Chest>Moderate severity bilateral multifocal infiltrates. 2. Marked severity left lower lobe consolidation 6/21: MRI Brain: Extensive diffusion restriction in the bilateral cerebellar hemispheres, brainstem, thalami, and occipital lobes most in keeping with extensive posterior circulation infarcts (progressed since the prior CT particularly in the occipital lobes) with evidence of associated hemorrhage in the pons, thalami, and occipital lobes. The fourth ventricle and basal cisterns remain patent without evidence of obstructive hydrocephalus at this time. Consider vascular imaging.  Micro Data:  6/20: SARS-CoV-2 PCR> negative 6/20: Blood culture x2> 6/20: Urine Culture> 6/20: MRSA PCR>>  6/20: Strep pneumo urinary antigen> 6/20: Legionella urinary antigen> 6/20: Tracheal aspirate>  Antimicrobials:  Vancomycin and Cefepime  x 1 dose Now on Unasyn 6/20-6/25 (5 day course planned)  OBJECTIVE  Blood pressure (!) 140/92, pulse 71, temperature 98.4 F (36.9 C), resp. rate 18, height 5' 6.5" (1.689 m), weight 73.1 kg, SpO2 100 %.    Vent Mode: PRVC FiO2 (%):  [30 %-100 %] 100 % Set Rate:  [18 bmp-22 bmp] 18 bmp Vt Set:  [500 mL-550 mL] 550 mL PEEP:  [5 cmH20-10 cmH20] 5 cmH20 Plateau Pressure:  [18 cmH20-23 cmH20] 18 cmH20   Intake/Output Summary (Last 24 hours) at 12/01/2021 0850 Last data filed at 12/01/2021 0800 Gross per 24 hour  Intake 4343 ml  Output 1260 ml  Net 3083 ml   Filed Weights   11/19/2021 2248 11/30/21 0500 12/01/21 0316  Weight: 73.2 kg 73.8 kg 73.1 kg   Physical Examination   Temp:  [98.4 F (36.9 C)-102.7 F (39.3 C)] 98.4 F (36.9 C) (06/22 0600) Pulse Rate:  [71-117] 71 (06/22 0840) Resp:  [18-22] 18 (06/22 0840) SpO2:  [92 %-100 %] 100 % (06/22 0840) Arterial Line BP: (135-205)/(50-84) 139/53 (06/22 0600) FiO2 (%):  [30 %-100 %] 100 % (06/22 0840) Weight:  [73.1 kg] 73.1 kg (06/22 0316)  GENERAL:61 year-old critically ill patient lying in the bed intubated EYES: Pupils equal, round, minimally reactive to light and accommodation on the right. Unable to asses the left due to periorbital edema.  No scleral icterus. Extraocular muscles not checked HEENT: Head traumatic see below, normocephalic. Oropharynx and nasopharynx with tan secretions  NECK:  Supple, no jugular venous distention. No thyroid enlargement, no tenderness.  LUNGS: Mechanical breath sounds with bilateral crackles CARDIOVASCULAR: S1, S2 normal. No murmurs, rubs, or gallops.  ABDOMEN: Soft, nontender, nondistended. Bowel sounds present. No organomegaly or mass.  EXTREMITIES: No pedal edema, cyanosis, or clubbing.  \     Labs/imaging that I havepersonally reviewed  (right click and "Reselect all SmartList Selections" daily)     Labs   CBC: Recent Labs  Lab 11/28/2021 1850 11/30/21 0357 12/01/21 0039  WBC  12.5* 9.7 13.0*  NEUTROABS 9.5*  --   --   HGB 15.3 17.7* 13.8  HCT 50.3 58.9* 44.7  MCV 88.4 89.4 87.8  PLT 216 228 0000000    Basic Metabolic Panel: Recent Labs  Lab 11/24/2021 1850 11/30/21 0357 12/01/21 0039 12/01/21 0500  NA 142 143 140 139  140  K 4.0 5.0 4.3 3.9  3.9  CL 108 111 111 111  111  CO2 25 24 23 25  25   GLUCOSE 132* 113* 125* 175*  176*  BUN 16 19 25* 25*  22*  CREATININE 0.68 0.92 1.17 1.07  1.08  CALCIUM 8.6* 8.9 8.3* 8.2*  8.2*  MG 1.9 2.0 1.9 2.0  2.0  PHOS  --  4.8* 3.2 2.6  2.6   GFR: Estimated Creatinine Clearance: 66.9 mL/min (by C-G formula based on SCr of 1.08 mg/dL). Recent Labs  Lab 11/24/2021 1850 12/05/2021 2105 11/30/21 0357 12/01/21 0039 12/01/21 0500 12/01/21 0502  PROCALCITON <0.10  --  3.95  --  6.04  --   WBC 12.5*  --  9.7 13.0*  --   --   LATICACIDVEN 1.9 1.6 4.0*  --   --  1.4    Liver Function Tests: Recent Labs  Lab 11/19/2021 1850 12/01/21 0039 12/01/21 0500  AST 72* 41 39  ALT 33 27 25  ALKPHOS 84 66 62  BILITOT 0.9 0.6 0.4  PROT 6.4* 5.8* 5.4*  ALBUMIN  3.3* 2.5* 2.4*   No results for input(s): "LIPASE", "AMYLASE" in the last 168 hours. No results for input(s): "AMMONIA" in the last 168 hours.  ABG    Component Value Date/Time   PHART 7.4 12/01/2021 0504   PCO2ART 44 12/01/2021 0504   PO2ART 386 (H) 12/01/2021 0504   HCO3 27.3 12/01/2021 0504   ACIDBASEDEF 4.9 (H) 11/30/2021 0607   O2SAT 100 12/01/2021 0504     Coagulation Profile: Recent Labs  Lab 12/22/21 1850 12/01/21 0039 12/01/21 0500  INR 1.0 1.1 1.1    Cardiac Enzymes: Recent Labs  Lab December 22, 2021 1850 11/30/21 0357  CKTOTAL 4,319* 4,989*    HbA1C: Hgb A1c MFr Bld  Date/Time Value Ref Range Status  Dec 22, 2021 06:50 PM 5.6 4.8 - 5.6 % Final    Comment:    (NOTE) Pre diabetes:          5.7%-6.4%  Diabetes:              >6.4%  Glycemic control for   <7.0% adults with diabetes     CBG: Recent Labs  Lab 11/30/21 1554  11/30/21 2044 11/30/21 2327 12/01/21 0328 12/01/21 0720  GLUCAP 100* 130* 74 75 112*    Review of Systems:   UNABLE TO OBTAIN PATIENT IS INTUBATED AND SEDATED  Past Medical History  He,  has no past medical history on file.   Surgical History   Unknown   Social History      Family History   His family history is not on file.   Allergies Not on File   Home Medications  Prior to Admission medications   Not on File    Scheduled Meds:   stroke: early stages of recovery book   Does not apply Once   budesonide (PULMICORT) nebulizer solution  0.25 mg Nebulization BID   Chlorhexidine Gluconate Cloth  6 each Topical Q0600   docusate  100 mg Per Tube BID   free water  30 mL Per Tube Q4H   insulin aspart  0-15 Units Subcutaneous Q4H   ipratropium-albuterol  3 mL Nebulization Q4H   nutrition supplement (JUVEN)  1 packet Per Tube BID BM   mouth rinse  15 mL Mouth Rinse Q2H   pantoprazole sodium  40 mg Per Tube Daily   polyethylene glycol  17 g Per Tube Daily   Continuous Infusions:  ampicillin-sulbactam (UNASYN) IV 3 g (12/01/21 0801)   feeding supplement (PIVOT 1.5 CAL) Stopped (12/01/21 7471)   fentaNYL infusion INTRAVENOUS 25 mcg/hr (12/01/21 0800)   lactated ringers 150 mL/hr at 12/01/21 0800   propofol (DIPRIVAN) infusion 10 mcg/kg/min (12/01/21 0800)   PRN Meds:.acetaminophen, docusate sodium, fentaNYL, ipratropium-albuterol, polyethylene glycol   Active Hospital Problem list   Acute hypoxic hypercapnic respiratory failure Sepsis Stroke Rhabdomyolysis Hypertension DM  Assessment & Plan:   Extensive Basilar Distrubution/PCA Distrubution Stroke PMHx: HTN, DM, Cocaine use -Initial CT head shows multiple acute to subacute infarct -MRI: "Extensive diffusion restriction in the bilateral cerebellar hemispheres, brainstem, thalami, and occipital lobes most in keeping with extensive posterior circulation infarcts (progressed since the prior CT particularly in the  occipital lobes) with evidence of associated hemorrhage in the pons, thalami, and occipital lobes. The fourth ventricle and basal cisterns remain patent without evidence of obstructive hydrocephalus at this time. Consider vascular imaging." -Frequent neuro checks -Minimize sedation -Overall prognosis is poor. Understanding the patient's prognosis, the family has decided to move towards compassionate extubation and comfort measures.   Acute Hypoxic Hypercapnic Respiratory Failure  in the setting of suspected Cocaine Toxicity, Aspiration  Event, Multifocal Pneumonia -Wean PEEP and FiO2 for sats greater than 90%. ARDSnet protocol.  -Plateau pressures less than 30 cm H20 -VAP bundle in place -Intermittent chest x-ray & ABG -PRN Duo-Nebs.  -Start CPT q6hr -wean sedation/analgesia for RASS goal 0 -Unasyn to complete a 5 day course for aspiration PNA.   Severe sepsis secondary to aspiration pneumonia Meets SIRS Criteria  -Monitor fever curve -Daily CBC. Recheck lactic acid.  -Follow cultures as above. Check tracheal aspirate.  -Continue Unsayn for aspiration PNA for a total of 5 days.    Rhabdomyolysis likely in the setting of prolonged downtime -Trend CK -Monitor I&O's / urinary output -Follow BMP -Ensure adequate renal perfusion -Avoid nephrotoxic agents as able -Continue aggressive hydration today.  Continue fluids given elevated CK.  Donor Evaluation --Ongoing work-up --Happy to coordinate with donor services to evaluate his candidacy --Will perform bronchoscopy today --We will not perform recruitment maneuvers as these have been shown to harm patients. Communicated to nursing and RT.   Diabetes mellitus -CBG's AC & hs; Target range of 140 to 180 -SSI -Follow ICU Hypo/Hyperglycemia protocol  Best practice:  Diet:  NPO Pain/Anxiety/Delirium protocol (if indicated): Yes (RASS goal 0) VAP protocol (if indicated): Yes DVT prophylaxis: LMWH GI prophylaxis: PPI Glucose  control:  SSI Yes Central venous access:  N/A Arterial line:  Yes, and it is still needed Foley:  Yes, and it is still needed Mobility:  bed rest  PT consulted: N/A Last date of multidisciplinary goals of care discussion [6/20] Code Status:  full code Disposition: ICU  = Goals of Care = Code Status Order: DNR.  Primary Emergency Contact: Berres,Angela  Family wishes to pursue pursue comfort measures and compassionate extubation. The patient is currently undergoing a donor evaluation.   Critical care time: A total of 40 minutes of critical care time was spent in the care of this patient which does not include time spent performing procedures.

## 2021-12-01 NOTE — Progress Notes (Signed)
[place] [facility] support representative: Harrold Donath to be on site around 10am today.  [phone]

## 2021-12-02 ENCOUNTER — Inpatient Hospital Stay: Payer: Medicaid Other

## 2021-12-02 ENCOUNTER — Inpatient Hospital Stay: Payer: Medicaid Other | Admitting: Certified Registered"

## 2021-12-02 ENCOUNTER — Other Ambulatory Visit: Payer: Self-pay

## 2021-12-02 ENCOUNTER — Encounter: Admission: EM | Disposition: E | Payer: Self-pay | Source: Home / Self Care | Attending: Internal Medicine

## 2021-12-02 DIAGNOSIS — J9601 Acute respiratory failure with hypoxia: Secondary | ICD-10-CM

## 2021-12-02 DIAGNOSIS — Z9911 Dependence on respirator [ventilator] status: Secondary | ICD-10-CM

## 2021-12-02 DIAGNOSIS — Z529 Donor of unspecified organ or tissue: Secondary | ICD-10-CM

## 2021-12-02 DIAGNOSIS — I639 Cerebral infarction, unspecified: Secondary | ICD-10-CM

## 2021-12-02 DIAGNOSIS — A419 Sepsis, unspecified organism: Secondary | ICD-10-CM

## 2021-12-02 DIAGNOSIS — T50901D Poisoning by unspecified drugs, medicaments and biological substances, accidental (unintentional), subsequent encounter: Secondary | ICD-10-CM

## 2021-12-02 DIAGNOSIS — M6282 Rhabdomyolysis: Secondary | ICD-10-CM

## 2021-12-02 DIAGNOSIS — J690 Pneumonitis due to inhalation of food and vomit: Secondary | ICD-10-CM

## 2021-12-02 DIAGNOSIS — Z978 Presence of other specified devices: Secondary | ICD-10-CM

## 2021-12-02 DIAGNOSIS — E119 Type 2 diabetes mellitus without complications: Secondary | ICD-10-CM

## 2021-12-02 HISTORY — PX: ORGAN PROCUREMENT: SHX5270

## 2021-12-02 LAB — APTT
aPTT: 31 seconds (ref 24–36)
aPTT: 31 seconds (ref 24–36)
aPTT: 31 seconds (ref 24–36)

## 2021-12-02 LAB — BLOOD GAS, ARTERIAL
Acid-Base Excess: 4.2 mmol/L — ABNORMAL HIGH (ref 0.0–2.0)
Acid-Base Excess: 5.8 mmol/L — ABNORMAL HIGH (ref 0.0–2.0)
Bicarbonate: 29.2 mmol/L — ABNORMAL HIGH (ref 20.0–28.0)
Bicarbonate: 31.7 mmol/L — ABNORMAL HIGH (ref 20.0–28.0)
FIO2: 100 %
FIO2: 30 %
MECHVT: 550 mL
O2 Saturation: 99.6 %
O2 Saturation: 100 %
PEEP: 5 cmH2O
PEEP: 10 cmH2O
Patient temperature: 37
Patient temperature: 37
RATE: 18 resp/min
pCO2 arterial: 44 mmHg (ref 32–48)
pCO2 arterial: 50 mmHg — ABNORMAL HIGH (ref 32–48)
pH, Arterial: 7.43 (ref 7.35–7.45)
pH, Arterial: 7.41 (ref 7.35–7.45)
pO2, Arterial: 406 mmHg — ABNORMAL HIGH (ref 83–108)
pO2, Arterial: 136 mmHg — ABNORMAL HIGH (ref 83–108)

## 2021-12-02 LAB — COMPREHENSIVE METABOLIC PANEL
ALT: 26 U/L (ref 0–44)
ALT: 30 U/L (ref 0–44)
ALT: 35 U/L (ref 0–44)
AST: 37 U/L (ref 15–41)
AST: 43 U/L — ABNORMAL HIGH (ref 15–41)
AST: 45 U/L — ABNORMAL HIGH (ref 15–41)
Albumin: 2 g/dL — ABNORMAL LOW (ref 3.5–5.0)
Albumin: 2 g/dL — ABNORMAL LOW (ref 3.5–5.0)
Albumin: 2 g/dL — ABNORMAL LOW (ref 3.5–5.0)
Alkaline Phosphatase: 64 U/L (ref 38–126)
Alkaline Phosphatase: 68 U/L (ref 38–126)
Alkaline Phosphatase: 71 U/L (ref 38–126)
Anion gap: 5 (ref 5–15)
Anion gap: 3 — ABNORMAL LOW (ref 5–15)
Anion gap: 2 — ABNORMAL LOW (ref 5–15)
BUN: 26 mg/dL — ABNORMAL HIGH (ref 6–20)
BUN: 26 mg/dL — ABNORMAL HIGH (ref 6–20)
BUN: 24 mg/dL — ABNORMAL HIGH (ref 6–20)
CO2: 27 mmol/L (ref 22–32)
CO2: 28 mmol/L (ref 22–32)
CO2: 29 mmol/L (ref 22–32)
Calcium: 8 mg/dL — ABNORMAL LOW (ref 8.9–10.3)
Calcium: 7.9 mg/dL — ABNORMAL LOW (ref 8.9–10.3)
Calcium: 8 mg/dL — ABNORMAL LOW (ref 8.9–10.3)
Chloride: 108 mmol/L (ref 98–111)
Chloride: 110 mmol/L (ref 98–111)
Chloride: 110 mmol/L (ref 98–111)
Creatinine, Ser: 0.97 mg/dL (ref 0.61–1.24)
Creatinine, Ser: 0.86 mg/dL (ref 0.61–1.24)
Creatinine, Ser: 0.83 mg/dL (ref 0.61–1.24)
GFR, Estimated: >60 mL/min (ref >60)
GFR, Estimated: >60 mL/min (ref >60)
GFR, Estimated: >60 mL/min (ref >60)
Glucose, Bld: 134 mg/dL — ABNORMAL HIGH (ref 70–99)
Glucose, Bld: 165 mg/dL — ABNORMAL HIGH (ref 70–99)
Glucose, Bld: 154 mg/dL — ABNORMAL HIGH (ref 70–99)
Potassium: 3.8 mmol/L (ref 3.5–5.1)
Potassium: 3.4 mmol/L — ABNORMAL LOW (ref 3.5–5.1)
Potassium: 3.7 mmol/L (ref 3.5–5.1)
Sodium: 140 mmol/L (ref 135–145)
Sodium: 141 mmol/L (ref 135–145)
Sodium: 141 mmol/L (ref 135–145)
Total Bilirubin: 0.4 mg/dL (ref 0.3–1.2)
Total Bilirubin: 0.2 mg/dL — ABNORMAL LOW (ref 0.3–1.2)
Total Bilirubin: 0.2 mg/dL — ABNORMAL LOW (ref 0.3–1.2)
Total Protein: 4.8 g/dL — ABNORMAL LOW (ref 6.5–8.1)
Total Protein: 4.8 g/dL — ABNORMAL LOW (ref 6.5–8.1)
Total Protein: 4.7 g/dL — ABNORMAL LOW (ref 6.5–8.1)

## 2021-12-02 LAB — URINALYSIS, COMPLETE (UACMP) WITH MICROSCOPIC
Bilirubin Urine: NEGATIVE
Glucose, UA: NEGATIVE mg/dL
Hgb urine dipstick: NEGATIVE
Ketones, ur: NEGATIVE mg/dL
Nitrite: NEGATIVE
Protein, ur: NEGATIVE mg/dL
Specific Gravity, Urine: 1.025 (ref 1.005–1.030)
Squamous Epithelial / HPF: NONE SEEN (ref 0–5)
pH: 6 (ref 5.0–8.0)

## 2021-12-02 LAB — GLUCOSE, CAPILLARY
Glucose-Capillary: 124 mg/dL — ABNORMAL HIGH (ref 70–99)
Glucose-Capillary: 171 mg/dL — ABNORMAL HIGH (ref 70–99)
Glucose-Capillary: 152 mg/dL — ABNORMAL HIGH (ref 70–99)
Glucose-Capillary: 147 mg/dL — ABNORMAL HIGH (ref 70–99)

## 2021-12-02 LAB — CBC
HCT: 35.7 % — ABNORMAL LOW (ref 39.0–52.0)
HCT: 33.8 % — ABNORMAL LOW (ref 39.0–52.0)
HCT: 33.4 % — ABNORMAL LOW (ref 39.0–52.0)
Hemoglobin: 10.8 g/dL — ABNORMAL LOW (ref 13.0–17.0)
Hemoglobin: 10.5 g/dL — ABNORMAL LOW (ref 13.0–17.0)
Hemoglobin: 10.3 g/dL — ABNORMAL LOW (ref 13.0–17.0)
MCH: 26.8 pg (ref 26.0–34.0)
MCH: 27.9 pg (ref 26.0–34.0)
MCH: 27.2 pg (ref 26.0–34.0)
MCHC: 30.3 g/dL (ref 30.0–36.0)
MCHC: 31.1 g/dL (ref 30.0–36.0)
MCHC: 30.8 g/dL (ref 30.0–36.0)
MCV: 88.6 fL (ref 80.0–100.0)
MCV: 89.7 fL (ref 80.0–100.0)
MCV: 88.1 fL (ref 80.0–100.0)
Platelets: 152 10*3/uL (ref 150–400)
Platelets: 143 10*3/uL — ABNORMAL LOW (ref 150–400)
Platelets: 135 10*3/uL — ABNORMAL LOW (ref 150–400)
RBC: 4.03 MIL/uL — ABNORMAL LOW (ref 4.22–5.81)
RBC: 3.77 MIL/uL — ABNORMAL LOW (ref 4.22–5.81)
RBC: 3.79 MIL/uL — ABNORMAL LOW (ref 4.22–5.81)
RDW: 14.7 % (ref 11.5–15.5)
RDW: 14.7 % (ref 11.5–15.5)
RDW: 14.7 % (ref 11.5–15.5)
WBC: 11.9 10*3/uL — ABNORMAL HIGH (ref 4.0–10.5)
WBC: 10.9 10*3/uL — ABNORMAL HIGH (ref 4.0–10.5)
WBC: 10.5 10*3/uL (ref 4.0–10.5)
nRBC: 0 % (ref 0.0–0.2)
nRBC: 0 % (ref 0.0–0.2)
nRBC: 0 % (ref 0.0–0.2)

## 2021-12-02 LAB — URINE CULTURE: Culture: NO GROWTH

## 2021-12-02 LAB — PHOSPHORUS
Phosphorus: 2.3 mg/dL — ABNORMAL LOW (ref 2.5–4.6)
Phosphorus: 2.2 mg/dL — ABNORMAL LOW (ref 2.5–4.6)
Phosphorus: 2.4 mg/dL — ABNORMAL LOW (ref 2.5–4.6)

## 2021-12-02 LAB — PROTIME-INR
INR: 1.1 (ref 0.8–1.2)
INR: 1.1 (ref 0.8–1.2)
INR: 1 (ref 0.8–1.2)
Prothrombin Time: 13.8 seconds (ref 11.4–15.2)
Prothrombin Time: 13.7 seconds (ref 11.4–15.2)
Prothrombin Time: 13.2 seconds (ref 11.4–15.2)

## 2021-12-02 LAB — MAGNESIUM
Magnesium: 2 mg/dL (ref 1.7–2.4)
Magnesium: 2.1 mg/dL (ref 1.7–2.4)
Magnesium: 2 mg/dL (ref 1.7–2.4)

## 2021-12-02 LAB — CULTURE, RESPIRATORY W GRAM STAIN: Culture: NORMAL

## 2021-12-02 LAB — BILIRUBIN, DIRECT
Bilirubin, Direct: <0.1 mg/dL (ref 0.0–0.2)
Bilirubin, Direct: 0.1 mg/dL (ref 0.0–0.2)
Bilirubin, Direct: 0.1 mg/dL (ref 0.0–0.2)

## 2021-12-02 LAB — LEGIONELLA PNEUMOPHILA SEROGP 1 UR AG: L. pneumophila Serogp 1 Ur Ag: NEGATIVE

## 2021-12-02 SURGERY — SURGICAL PROCUREMENT, ORGAN
Anesthesia: General

## 2021-12-02 MED ORDER — HEPARIN SODIUM (PORCINE) 1000 UNIT/ML IJ SOLN
800.0000 [IU] | Freq: Once | INTRAMUSCULAR | Status: AC
  Administered 2021-12-02: 800 [IU] via INTRAVENOUS
  Filled 2021-12-02: qty 1

## 2021-12-02 MED ORDER — POTASSIUM CHLORIDE 20 MEQ PO PACK
40.0000 meq | PACK | Freq: Once | ORAL | Status: AC
  Administered 2021-12-02: 40 meq
  Filled 2021-12-02: qty 2

## 2021-12-02 MED ORDER — SODIUM CHLORIDE 0.9% IV SOLUTION: Freq: Once | INTRAVENOUS | Status: DC

## 2021-12-02 MED ORDER — HEPARIN SODIUM (PORCINE) 5000 UNIT/ML IJ SOLN
INTRAMUSCULAR | Status: AC
  Filled 2021-12-02: qty 2

## 2021-12-02 SURGICAL SUPPLY — 82 items
APL PRP STRL LF DISP 70% ISPRP (MISCELLANEOUS) ×2
APPLIER CLIP 13 LRG OPEN (CLIP) ×2
APR CLP LRG 13 20 CLIP (CLIP) ×1
BAG ISL LRG 20X20 DRWSTRG (DRAPES) ×6
BAG ISOLATATION DRAPE 20X20 ST (DRAPES) IMPLANT
BLADE CLIPPER SURG (BLADE) ×2 IMPLANT
BLADE STERNUM SYSTEM 6 (BLADE) ×2 IMPLANT
BLADE SURG 11 STRL SS (BLADE) ×1 IMPLANT
CHLORAPREP W/TINT 26 (MISCELLANEOUS) ×4 IMPLANT
CLIP APPLIE 13 LRG OPEN (CLIP) IMPLANT
CLIP TI MEDIUM 6 (CLIP) ×2 IMPLANT
CLIP TI WIDE RED SMALL 6 (CLIP) ×2 IMPLANT
CNTNR SPEC 2.5X3XGRAD LEK (MISCELLANEOUS) ×2
CONT SPEC 4OZ STER OR WHT (MISCELLANEOUS) ×4
CONT SPEC 4OZ STRL OR WHT (MISCELLANEOUS) ×2
CONTAINER SPEC 2.5X3XGRAD LEK (MISCELLANEOUS) ×1 IMPLANT
COVER BACK TABLE 60X90IN (DRAPES) ×2 IMPLANT
COVER LIGHT HANDLE STERIS (MISCELLANEOUS) IMPLANT
COVER MAYO STAND STRL (DRAPES) IMPLANT
DRAPE 3/4 80X56 (DRAPES) IMPLANT
DRAPE ISOLATE BAG 20X20 STRL (DRAPES) ×12
DRAPE LAPAROTOMY 100X77 ABD (DRAPES) ×2 IMPLANT
DRSG OPSITE POSTOP 4X12 (GAUZE/BANDAGES/DRESSINGS) IMPLANT
DRSG OPSITE POSTOP 4X14 (GAUZE/BANDAGES/DRESSINGS) IMPLANT
DRSG OPSITE POSTOP 4X8 (GAUZE/BANDAGES/DRESSINGS) IMPLANT
DRSG TELFA 3X8 NADH (GAUZE/BANDAGES/DRESSINGS) ×4 IMPLANT
ELECT BLADE 6.5 EXT (BLADE) IMPLANT
ELECT REM PT RETURN 9FT ADLT (ELECTROSURGICAL)
ELECTRODE REM PT RTRN 9FT ADLT (ELECTROSURGICAL) IMPLANT
GAUZE 4X4 16PLY ~~LOC~~+RFID DBL (SPONGE) ×2 IMPLANT
GAUZE SPONGE 4X4 12PLY STRL (GAUZE/BANDAGES/DRESSINGS) IMPLANT
GOWN STRL REUS W/ TWL LRG LVL3 (GOWN DISPOSABLE) ×4 IMPLANT
GOWN STRL REUS W/TWL LRG LVL3 (GOWN DISPOSABLE) ×20
HANDLE SUCTION POOLE (INSTRUMENTS) ×2 IMPLANT
HANDLE YANKAUER SUCT BULB TIP (MISCELLANEOUS) ×4 IMPLANT
KIT POST MORTEM ADULT 36X90 (BAG) ×2 IMPLANT
KIT TURNOVER KIT A (KITS) ×2 IMPLANT
LOOP RED MAXI  1X406MM (MISCELLANEOUS) ×2
LOOP VESSEL MAXI 1X406 RED (MISCELLANEOUS) ×1 IMPLANT
LOOP VESSEL MINI 0.8X406 BLUE (MISCELLANEOUS) ×1 IMPLANT
LOOPS BLUE MINI 0.8X406MM (MISCELLANEOUS) ×2
MANIFOLD NEPTUNE II (INSTRUMENTS) ×6 IMPLANT
MAT ABSORB  FLUID 56X50 GRAY (MISCELLANEOUS) ×4
MAT ABSORB FLUID 56X50 GRAY (MISCELLANEOUS) ×2 IMPLANT
NDL BIOPSY 14X6 SOFT TISS (NEEDLE) IMPLANT
NEEDLE BIOPSY 14X6 SOFT TISS (NEEDLE) ×2 IMPLANT
PACK BASIN MAJOR ARMC (MISCELLANEOUS) ×2 IMPLANT
PACK UNIVERSAL (MISCELLANEOUS) ×2 IMPLANT
PAD DRESSING TELFA 3X8 NADH (GAUZE/BANDAGES/DRESSINGS) ×1 IMPLANT
PENCIL ELECTRO HAND CTR (MISCELLANEOUS) ×2 IMPLANT
SPONGE KITTNER 5P (MISCELLANEOUS) ×2 IMPLANT
SPONGE T-LAP 18X18 ~~LOC~~+RFID (SPONGE) ×10 IMPLANT
STAPLER SKIN PROX 35W (STAPLE) ×2 IMPLANT
SUCTION POOLE HANDLE (INSTRUMENTS) ×4
SUT BONE WAX W31G (SUTURE) ×2 IMPLANT
SUT ETHIBOND #5 BRAIDED 30INL (SUTURE) IMPLANT
SUT ETHIBOND 2 V 37 (SUTURE) IMPLANT
SUT ETHIBOND NAB CT1 #1 30IN (SUTURE) ×6 IMPLANT
SUT ETHILON 2 0 FS 18 (SUTURE) ×2 IMPLANT
SUT ETHILON NAB BLK LR #2 30IN (SUTURE) ×4 IMPLANT
SUT PROLENE 4 0 RB 1 (SUTURE)
SUT PROLENE 4 0 SH DA (SUTURE) ×5 IMPLANT
SUT PROLENE 4-0 RB1 .5 CRCL 36 (SUTURE) IMPLANT
SUT PROLENE 6 0 BV (SUTURE) IMPLANT
SUT SILK 0 (SUTURE) ×2
SUT SILK 0 30XBRD TIE 6 (SUTURE) ×1 IMPLANT
SUT SILK 1 SH (SUTURE) IMPLANT
SUT SILK 1 TIES 10/18 (SUTURE) ×2 IMPLANT
SUT SILK 2 0 (SUTURE) ×6
SUT SILK 2 0 SH (SUTURE) IMPLANT
SUT SILK 2 0 SH CR/8 (SUTURE) IMPLANT
SUT SILK 2-0 18XBRD TIE 12 (SUTURE) ×1 IMPLANT
SUT SILK 3 0 (SUTURE)
SUT SILK 3-0 18XBRD TIE 12 (SUTURE) IMPLANT
SYR 10ML LL (SYRINGE) ×2 IMPLANT
SYR 20ML LL LF (SYRINGE) ×2 IMPLANT
SYR TOOMEY 50ML (SYRINGE) ×1 IMPLANT
TAPE UMBILICAL 1/8 X36 TWILL (MISCELLANEOUS) ×2 IMPLANT
TOWEL OR 17X26 4PK STRL BLUE (TOWEL DISPOSABLE) ×4 IMPLANT
TUBING CONNECTING 10 (TUBING) ×4 IMPLANT
WATER STERILE IRR 1000ML POUR (IV SOLUTION) ×2 IMPLANT
WATER STERILE IRR 500ML POUR (IV SOLUTION) ×2 IMPLANT

## 2021-12-03 LAB — BPAM RBC
Blood Product Expiration Date: 202307182359
Blood Product Expiration Date: 202307182359
Blood Product Expiration Date: 202307182359
Blood Product Expiration Date: 202307182359
Blood Product Expiration Date: 202307182359
Unit Type and Rh: 6200
Unit Type and Rh: 6200
Unit Type and Rh: 6200
Unit Type and Rh: 6200
Unit Type and Rh: 6200

## 2021-12-03 LAB — TYPE AND SCREEN
ABO/RH(D): A POS
Antibody Screen: NEGATIVE
Unit division: 0
Unit division: 0
Unit division: 0
Unit division: 0
Unit division: 0

## 2021-12-03 LAB — PREPARE RBC (CROSSMATCH)

## 2021-12-04 LAB — CULTURE, RESPIRATORY W GRAM STAIN: Culture: NO GROWTH

## 2021-12-04 LAB — CULTURE, BLOOD (ROUTINE X 2)
Culture: NO GROWTH
Culture: NO GROWTH

## 2021-12-06 LAB — SURGICAL PATHOLOGY

## 2021-12-07 LAB — BLOOD GAS, ARTERIAL
Acid-Base Excess: 2.4 mmol/L — ABNORMAL HIGH (ref 0.0–2.0)
Acid-Base Excess: 6.4 mmol/L — ABNORMAL HIGH (ref 0.0–2.0)
Acid-base deficit: 2.7 mmol/L — ABNORMAL HIGH (ref 0.0–2.0)
Bicarbonate: 24.5 mmol/L (ref 20.0–28.0)
Bicarbonate: 27.2 mmol/L (ref 20.0–28.0)
Bicarbonate: 31.3 mmol/L — ABNORMAL HIGH (ref 20.0–28.0)
FIO2: 100 %
FIO2: 100 %
MECHVT: 500 mL
O2 Saturation: 95.4 %
O2 Saturation: 100 %
O2 Saturation: 100 %
PEEP: 5 cmH2O
PEEP: 5 cmH2O
Patient temperature: 37
Patient temperature: 37
Patient temperature: 37
RATE: 20 resp/min
pCO2 arterial: 51 mmHg — ABNORMAL HIGH (ref 32–48)
pCO2 arterial: 42 mmHg (ref 32–48)
pCO2 arterial: 45 mmHg (ref 32–48)
pH, Arterial: 7.29 — ABNORMAL LOW (ref 7.35–7.45)
pH, Arterial: 7.42 (ref 7.35–7.45)
pH, Arterial: 7.45 (ref 7.35–7.45)
pO2, Arterial: 75 mmHg — ABNORMAL LOW (ref 83–108)
pO2, Arterial: 306 mmHg — ABNORMAL HIGH (ref 83–108)
pO2, Arterial: 408 mmHg — ABNORMAL HIGH (ref 83–108)

## 2021-12-07 IMAGING — MR MR MRA NECK W/O CM
2 of 3 series · 5 of 48 positions shown · non-contrast
Comparison: None.

CLINICAL DATA: Neuro deficit, acute, stroke suspected



[Series 1062: rcca · sagittal · 0.5mm · 0.34mm/px · 3 of 18 slices shown]
[im 1/18]
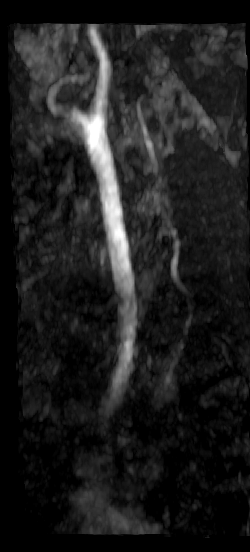
[im 9/18]
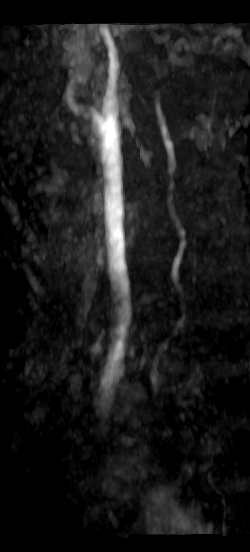
[im 18/18]
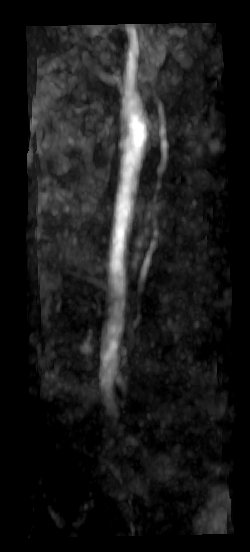

[Series 1068: lcca · sagittal · 0.5mm · 0.29mm/px · 2 of 16 slices shown]
[im 1/16]
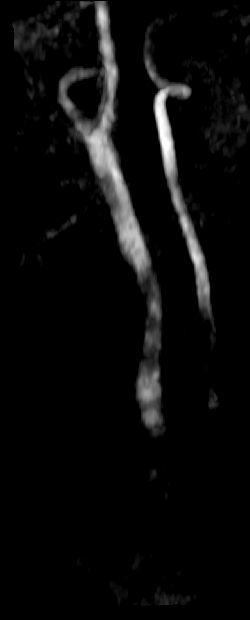
[im 16/16]
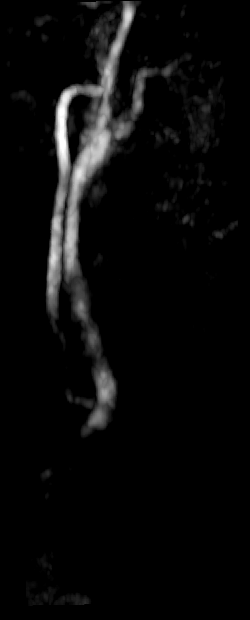

[5 of 48 positions shown; findings below may reference images not displayed]

FINDINGS: MRI HEAD FINDINGS

Brain: Acute bilateral occipital lobe (larger on the left ) and left
posterior temporal and parietal lobe infarcts. Associated edema with
mild regional mass effect. No midline shift. Remote lacunar infarcts
in bilateral basal ganglia with suspected prior hemorrhage in the
left basal ganglia given associated susceptibility artifact.
Additional remote infarcts in bilateral cerebellar hemispheres,
larger on the left. Otherwise, mild scattered T2 hyperintensities in
the white matter, likely related to chronic microvascular ischemic
disease. No evidence of acute hemorrhage. No mass lesion or
extra-axial fluid collection. No hydrocephalus.

Vascular: See below.

Skull and upper cervical spine: Normal marrow signal.

Sinuses/Orbits: Mild ethmoid air cell mucosal thickening.
Unremarkable orbits.

Other: No mastoid effusions.

MRA HEAD FINDINGS

Motion limited evaluation.

Anterior circulation: Bilateral intracranial ICAs, MCAs, and ACAs
are patent. Motion limited evaluation without evidence of a
high-grade proximal stenosis. Suspected severe stenosis of the
distal left ACA (A3/A4).

Posterior circulation: Severe stenosis versus occlusion of the
distal right intradural vertebral artery. Left intradural vertebral
artery is patent proximally with atherosclerotic narrowing. Severe
stenosis at the left vertebrobasilar junction. Moderate to severe
stenosis and irregularity of the proximal basilar artery. Bilateral
PCAs are patent. Severe stenosis of the right P2 PCA. Multifocal
mild-to-moderate stenosis of the left P2 PCA. No aneurysm
identified.

MRA NECK FINDINGS

Severely limited study. Nondiagnostic evaluation lower neck with
nonvisualization of the arteries. There appears to be antegrade flow
in the common carotid and internal carotid arteries and the left
vertebral artery in the mid upper neck. Likely left dominant
vertebral artery. Intermittent poor flow related signal in the right
vertebral artery, suggesting potential hemodynamically significant
stenosis in the neck.
IMPRESSION: MRI:

1. Acute bilateral occipital lobe (larger on the left ) and left
posterior temporal and parietal lobe infarcts (PCA territories).
Associated edema with mild regional mass effect. No midline shift.
2. Remote infarcts in bilateral cerebellar hemispheres and bilateral
basal ganglia with suspected prior hemorrhage in the left basal
ganglia.

MRA Head:

1. Severe stenosis versus occlusion of the distal right intradural
vertebral artery.
2. Severe stenosis at the left vertebrobasilar junction with
moderate to severe stenosis and irregularity of the proximal basilar
artery.
3. Severe right P2 PCA stenosis. Multifocal mild-to-moderate
stenosis of the left P2 PCA.
4. Suspected severe stenosis of the distal left ACA (A3/A4).
5. Right PICA is patent proximally with poor flow related signal
distally.

MRA Neck:

Severely limited study with nonvisualization of the arteries in the
lower neck. Intermittent poor flow related signal of the visualized
right vertebral artery, suggesting potential hemodynamically
significant stenosis. Antegrade flow in the visualized carotid
arteries and left vertebral artery in the mid to upper neck with
nondiagnostic evaluation for stenosis. A CTA of the neck could
better evaluate if clinically indicated.

## 2021-12-07 IMAGING — CR DG CHEST 2V
1 series · 2 of 2 positions shown · non-contrast
Comparison: None.

CLINICAL DATA: Cough.

EXAM:
CHEST - 2 VIEW

[Series 1: dg chest 2 view · 0.14mm/px · 2 of 2 slices shown]
[im 1/2]
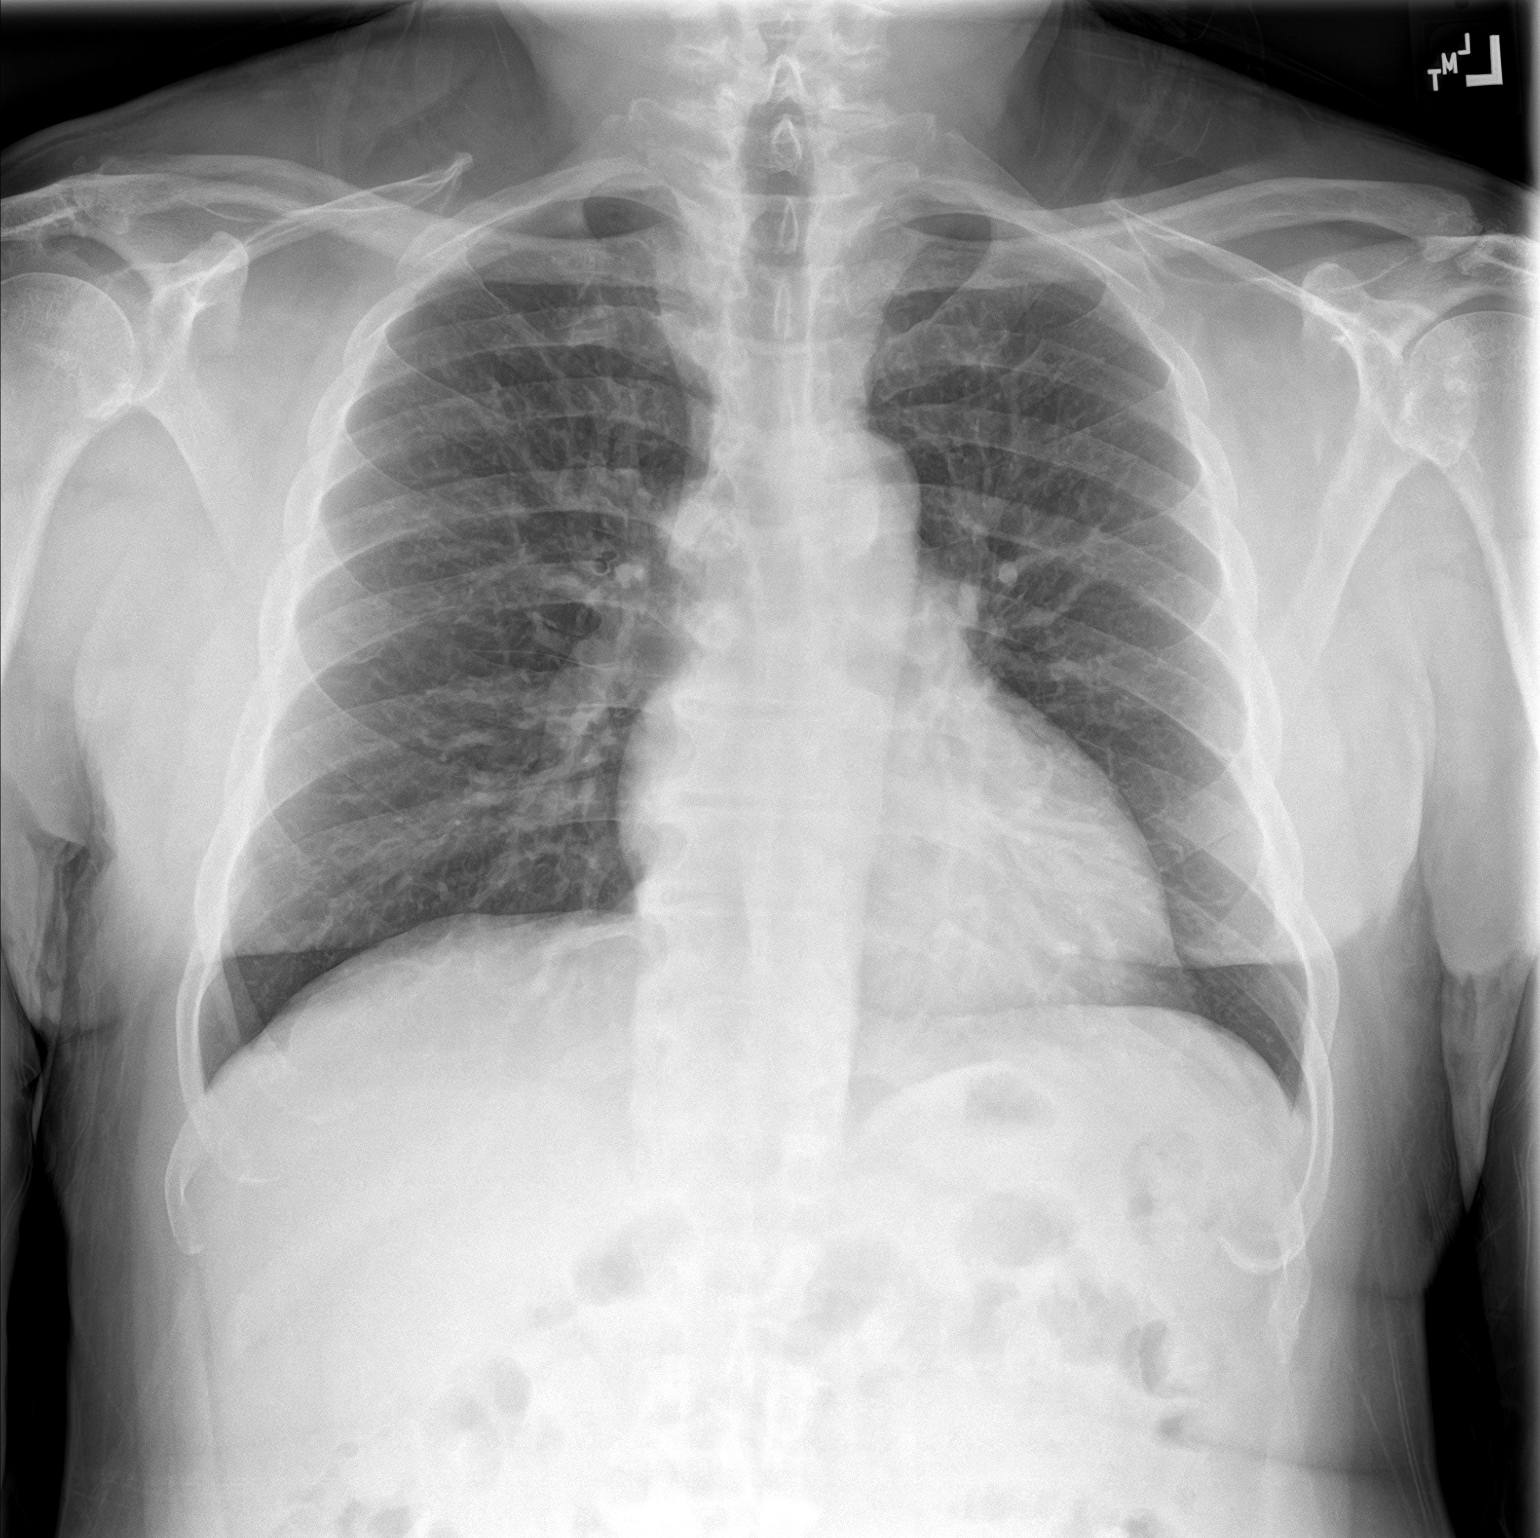
[im 2/2]
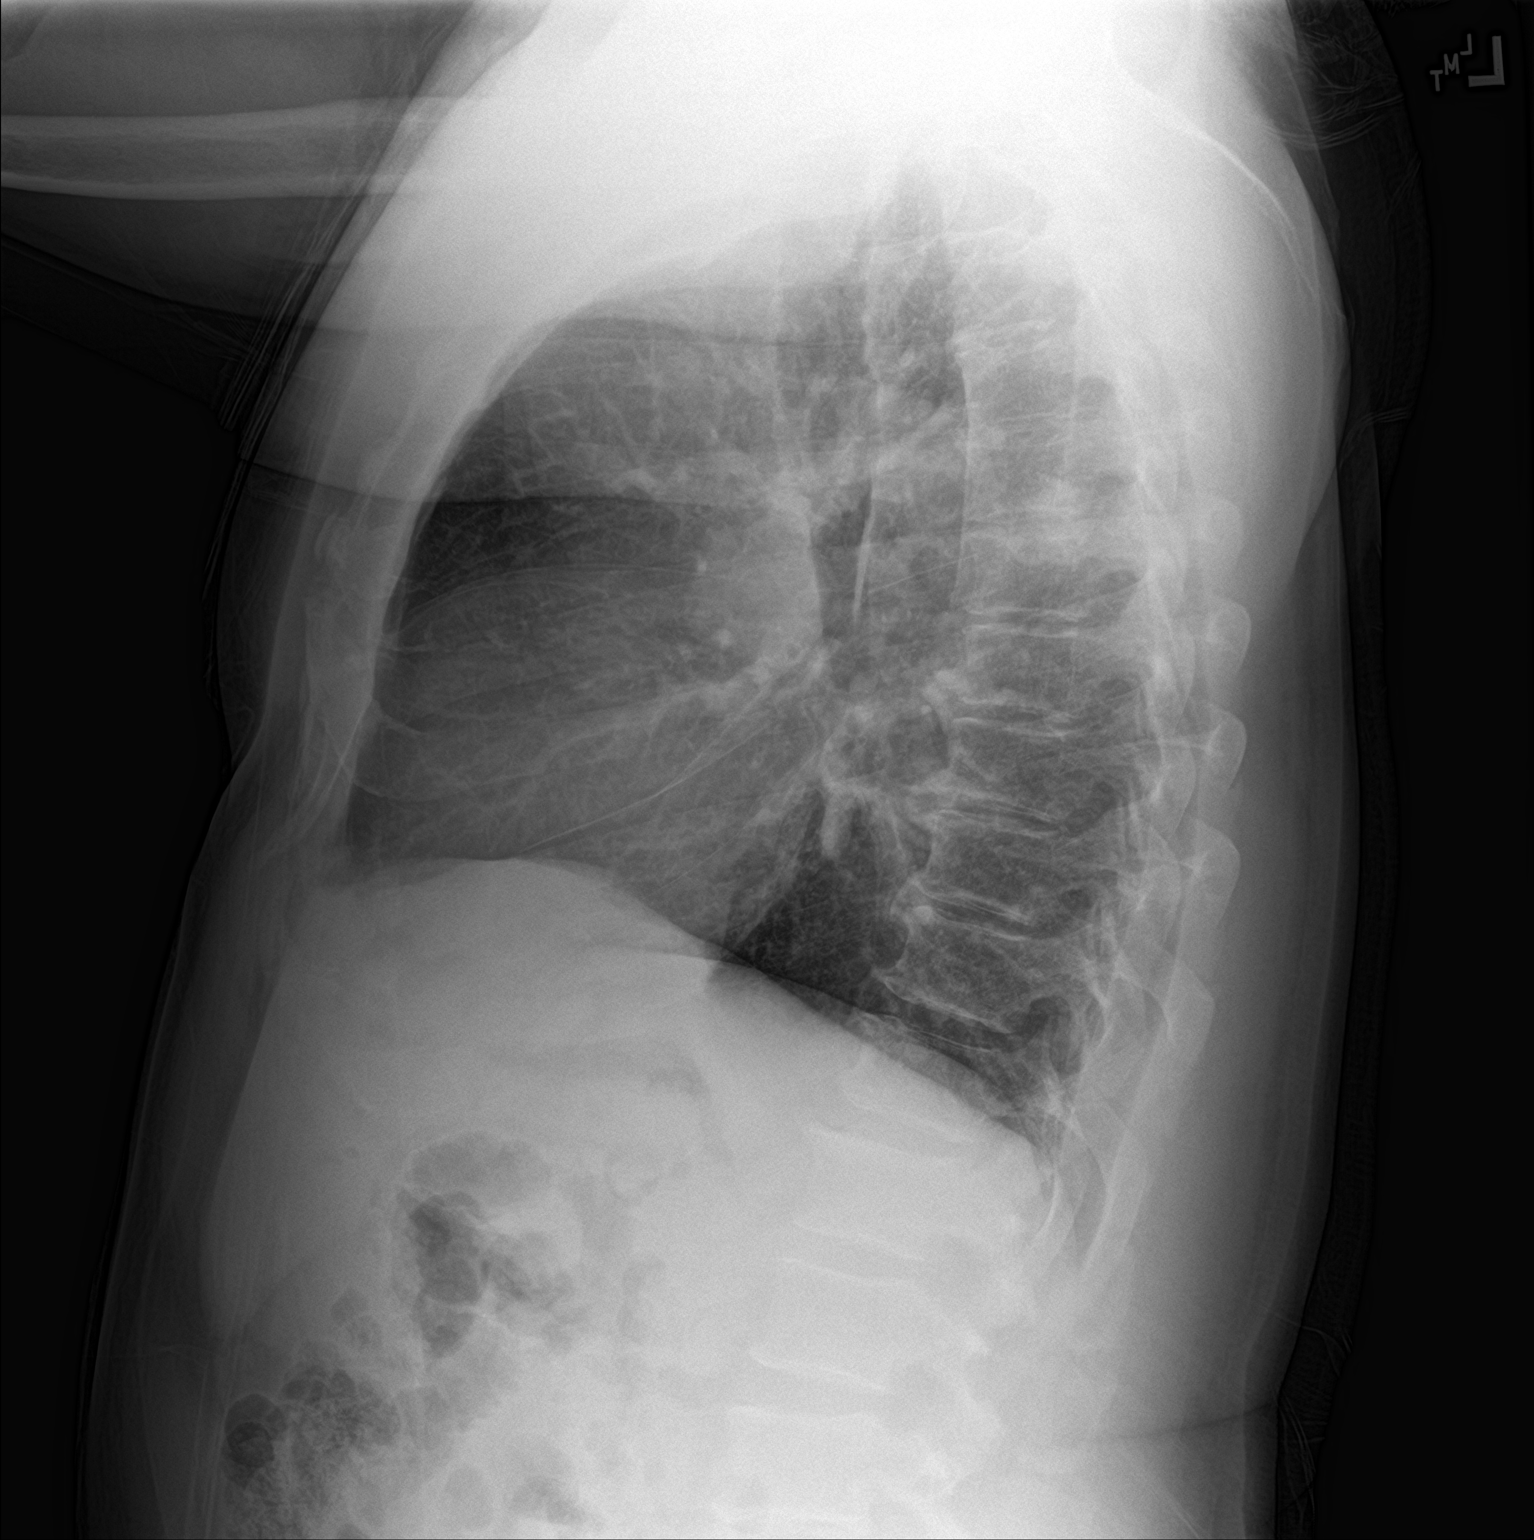

[2 of 2 positions shown; findings below may reference images not displayed]

FINDINGS: The heart size and mediastinal contours are within normal limits.
Both lungs are clear. The visualized skeletal structures are
unremarkable.
IMPRESSION: No active cardiopulmonary disease.

## 2021-12-09 ENCOUNTER — Encounter: Payer: Self-pay | Admitting: Emergency Medicine

## 2021-12-10 NOTE — Progress Notes (Signed)
RT assisted with patient transport to OR for organ procurement. Pt extubated @ 1851 per organ donation protocol.

## 2021-12-10 NOTE — Plan of Care (Signed)
Pt is an organ donor and going to Or around 1800

## 2021-12-10 NOTE — Progress Notes (Addendum)
NAME:  Clinton Knight, MRN:  [id number], DOB:  April 13, 1961, LOS: 3 ADMISSION DATE:  12/07/2021, CONSULTATION DATE: 11/23/2021 REFERRING MD: Vladimir Crofts MD, CHIEF COMPLAINT: Unresponsiveness   HPI  61 y.o  male with significant PMH of hypertension, DM and polysubstance abuse who presented to the ED with chief complaints of respiratory distress.  Per ED reports, EMS was called to patient's boarding house after being found down by housemate.  Per reports, patient's housemate had not heard from him since Friday however patient's daughter reports that she tried calling him Sunday afternoon and yesterday for Father's Day but he did not pick up.  Last time seen normal was Sunday morning per the patient's sister who claimed she was with the patient.  Per EMS report, patient's housemate busted door open and found him down with what appeared to be white substance on the floor.  On EMS arrival, patient was found to be hypoxic with sats in the 60s to 80s which slowly improved on nonrebreather.  ED Course: In the emergency department, the temperature was 36.4C, the heart rate 84 beats/minute, the blood pressure 146/99 mm Hg, the respiratory rate 26 breaths/minute, and the oxygen saturation 95% on NRB.  Patient remained unresponsive with gurgling respiration therefore he was intubated for airway protection.  Toxicology was positive for Cocaine.  Friends and family confirmed frequent use of  crack cocaine.  Due to possible sepsis and significantly elevated CK,Patient given IV fluids and started on broad-spectrum antibiotics Vanco and cefepime. PCCM consulted for admission.  Significant Hospital Events: Including procedures, antibiotic start and stop dates in addition to other pertinent events   Started Unasyn 6/20- Intubated 6/20 MRI demonstrates debilitating posterior circulation stroke. Discussed prognosis with family who would like to pursue comfort care.   Interim History / Subjective:  -No acute events  overnight -No reported fevers -Down to 30% FiO2 with a PEEP of 5. -Net positive for the admission 3.2 L. -CK is downtrending to 1300   Past Medical History  Polysubstance abuse (crack cocaine abuse) Hypertension Diabetes  Significant Hospital Events   6/20: Admitted to the ICU with acute hypoxic hypercapnic respiratory failure in the setting of cocaine intoxication 6/20: Ventilator settings are now down to 50% FiO2 and PEEP of 10 6/23: Patient is organ donor, plan for organ donation surgery later today  Consults:  PCCM  Procedures:  6/20: Intubation 6/22: Diagnostic bronchoscopy  Significant Diagnostic Tests:  6/20: Noncontrast CT head>Hypodensity extending from the right caudate and bilateral thalami into the midbrain, pons, and left cerebellum with additional areas of hypodensity in the left parieto-occipital region and left inferior cerebellum, concerning for infarcts that are most likely acute to subacute 6/20: CTA Chest>Moderate severity bilateral multifocal infiltrates. 2. Marked severity left lower lobe consolidation 6/21: MRI Brain: Extensive diffusion restriction in the bilateral cerebellar hemispheres, brainstem, thalami, and occipital lobes most in keeping with extensive posterior circulation infarcts (progressed since the prior CT particularly in the occipital lobes) with evidence of associated hemorrhage in the pons, thalami, and occipital lobes. The fourth ventricle and basal cisterns remain patent without evidence of obstructive hydrocephalus at this time. Consider vascular imaging.  Micro Data:  6/20: SARS-CoV-2 PCR> negative 6/20: Blood culture x2> 6/20: Urine Culture> 6/20: MRSA PCR>>  6/20: Strep pneumo urinary antigen> 6/20: Legionella urinary antigen> 6/20: Tracheal aspirate>  Antimicrobials:  Vancomycin and Cefepime x 1 dose Now on Unasyn 6/20-6/25 (5 day course planned)  OBJECTIVE  Blood pressure 134/66, pulse 66, temperature 97.7 F (36.5 C),  resp. rate 18, height 5' 6.5" (1.689 m), weight 79.7 kg, SpO2 96 %.    Vent Mode: PRVC FiO2 (%):  [30 %-100 %] 30 % Set Rate:  [18 bmp] 18 bmp Vt Set:  [550 mL] 550 mL PEEP:  [5 cmH20-10 cmH20] 10 cmH20 Plateau Pressure:  [23 cmH20-27 cmH20] 23 cmH20   Intake/Output Summary (Last 24 hours) at Dec 10, 2021 0910 Last data filed at 12-10-2021 0847 Gross per 24 hour  Intake 5602.08 ml  Output 1355 ml  Net 4247.08 ml    Filed Weights   11/30/21 0500 12/01/21 0316 2021/12/10 0500  Weight: 73.8 kg 73.1 kg 79.7 kg   Physical Examination   Temp:  [97.5 F (36.4 C)-99.1 F (37.3 C)] 97.7 F (36.5 C) (06/23 0803) Pulse Rate:  [66-83] 66 (06/23 0803) Resp:  [18] 18 (06/23 0803) BP: (110-149)/(61-76) 134/66 (06/23 0800) SpO2:  [96 %-100 %] 96 % (06/23 0803) Arterial Line BP: (129-167)/(52-64) 136/55 (06/23 0803) FiO2 (%):  [30 %-100 %] 30 % (06/23 0803) Weight:  [79.7 kg] 79.7 kg (06/23 0500)  GENERAL:61 year-old critically ill appearing patient lying in the bed intubated EYES: Pupils fixed midpoint.  Significant scleral edema.  No scleral icterus. HEENT: Head shows hematoma on the left periorbital area, normocephalic.  Oral mucosa moist.  Orotracheally intubated.   NECK:  Supple, no jugular venous distention. No thyroid enlargement, no tenderness.  LUNGS: Mechanical breath sounds with bilateral crackles CARDIOVASCULAR: S1, S2 normal. No murmurs, rubs, or gallops.  ABDOMEN: Soft, nontender, nondistended. Bowel sounds present. No organomegaly or mass.  EXTREMITIES:-Anasarca, 2+ NEURO: Unable to assess currently sedated on vent    Labs   CBC: Recent Labs  Lab 11/26/2021 1850 11/30/21 0357 12/01/21 0039 12/01/21 1101 12/01/21 2250 12/10/21 0453  WBC 12.5* 9.7 13.0* 12.7* 11.9* 10.9*  NEUTROABS 9.5*  --   --   --   --   --   HGB 15.3 17.7* 13.8 12.4* 10.8* 10.5*  HCT 50.3 58.9* 44.7 40.9 35.7* 33.8*  MCV 88.4 89.4 87.8 90.1 88.6 89.7  PLT 216 228 181 169 152 143*     Basic  Metabolic Panel: Recent Labs  Lab 12/01/21 0500 12/01/21 1101 12/01/21 1706 12/01/21 2250 December 10, 2021 0453  NA 139  140 140 140 140 141  K 3.9  3.9 4.0 3.9 3.8 3.4*  CL 111  111 109 109 108 110  CO2 _0 GLUCOSE 175*  176* 129* 149* 134* 165*  BUN 25*  22* 24* 25* 26* 26*  CREATININE 1.07  1.08 1.02 0.98 0.97 0.86  CALCIUM 8.2*  8.2* 8.2* 8.1* 8.0* 7.9*  MG 2.0  2.0 1.9 1.9 2.0 2.1  PHOS 2.6  2.6 3.2 2.4* 2.3* 2.2*    GFR: Estimated Creatinine Clearance: 91.6 mL/min (by C-G formula based on SCr of 0.86 mg/dL). Recent Labs  Lab 11/12/2021 1850 12/05/2021 2105 11/30/21 0357 12/01/21 0039 12/01/21 0500 12/01/21 0502 12/01/21 1101 12/01/21 2250 12/10/21 0453  PROCALCITON <0.10  --  3.95  --  6.04  --   --   --   --   WBC 12.5*  --  9.7 13.0*  --   --  12.7* 11.9* 10.9*  LATICACIDVEN 1.9 1.6 4.0*  --   --  1.4  --   --   --      Liver Function Tests: Recent Labs  Lab 12/01/21 0500 12/01/21 1101 12/01/21 1706 12/01/21 2250 12-10-2021 0453  AST 39 40 35 37 43*  ALT _0 ALKPHOS 62 62 59 64 68  BILITOT 0.4 0.4 0.2* 0.4 0.2*  PROT 5.4* 5.3* 5.0* 4.8* 4.8*  ALBUMIN 2.4* 2.2* 2.2* 2.0* 2.0*    No results for input(s): "LIPASE", "AMYLASE" in the last 168 hours. No results for input(s): "AMMONIA" in the last 168 hours.  ABG    Component Value Date/Time   PHART 7.41 2021-12-22 0600   PCO2ART 50 (H) 12/22/2021 0600   PO2ART 136 (H) 12-22-2021 0600   HCO3 31.7 (H) 12-22-2021 0600   ACIDBASEDEF 4.9 (H) 11/30/2021 0607   O2SAT 100 12/22/21 0600     Coagulation Profile: Recent Labs  Lab 12/01/21 0500 12/01/21 1101 12/01/21 1730 12/01/21 2250 2021/12/22 0453  INR 1.1 1.1 1.0 1.1 1.1     Cardiac Enzymes: Recent Labs  Lab 11/12/2021 1850 11/30/21 0357 12/01/21 0749  CKTOTAL 4,319* 4,989* 1,369*     HbA1C: Hgb A1c MFr Bld  Date/Time Value Ref Range Status  12/08/2021 06:50 PM 5.6 4.8 - 5.6 % Final    Comment:     (NOTE) Pre diabetes:          5.7%-6.4%  Diabetes:              >6.4%  Glycemic control for   <7.0% adults with diabetes     CBG: Recent Labs  Lab 12/01/21 1632 12/01/21 1937 12/01/21 2328 December 22, 2021 0325 12/22/21 0732  GLUCAP 120* 140* 130* 124* 171*     Review of Systems:   UNABLE TO OBTAIN PATIENT IS INTUBATED AND SEDATED  Allergies Not on File   Home Medications  Prior to Admission medications   Not on File    Scheduled Meds:   stroke: early stages of recovery book   Does not apply Once   sodium chloride   Intravenous Once   budesonide (PULMICORT) nebulizer solution  0.25 mg Nebulization BID   Chlorhexidine Gluconate Cloth  6 each Topical Q0600   docusate  100 mg Per Tube BID   free water  30 mL Per Tube Q4H   insulin aspart  0-15 Units Subcutaneous Q4H   ipratropium-albuterol  3 mL Nebulization Q4H   nutrition supplement (JUVEN)  1 packet Per Tube BID BM   mouth rinse  15 mL Mouth Rinse Q2H   pantoprazole sodium  40 mg Per Tube Daily   polyethylene glycol  17 g Per Tube Daily   Continuous Infusions:  ampicillin-sulbactam (UNASYN) IV Stopped (12-22-2021 0818)   feeding supplement (PIVOT 1.5 CAL) 55 mL/hr at 12-22-2021 0847   fentaNYL infusion INTRAVENOUS 50 mcg/hr (2021-12-22 0847)   lactated ringers 150 mL/hr at December 22, 2021 0847   propofol (DIPRIVAN) infusion 15 mcg/kg/min (12/22/21 0847)   PRN Meds:.acetaminophen, docusate sodium, fentaNYL, ipratropium-albuterol, polyethylene glycol   Active Hospital Problem list   Acute hypoxic hypercapnic respiratory failure Sepsis Stroke Rhabdomyolysis Hypertension DM  Assessment & Plan:   Extensive Basilar Distrubution/PCA Distrubution Stroke PMHx: HTN, DM, Cocaine abuse -Initial CT head shows multiple acute to subacute infarct -MRI: "Extensive diffusion restriction in the bilateral cerebellar hemispheres, brainstem, thalami, and occipital lobes most in keeping with extensive posterior circulation infarcts  (progressed since the prior CT particularly in the occipital lobes) with evidence of associated hemorrhage in the pons, thalami, and occipital lobes. The fourth ventricle and basal cisterns remain patent without evidence of obstructive hydrocephalus at this time. Consider vascular imaging." -Examination today,?  Brain death -Discontinue sedation, reassess neurologic exam -Overall prognosis is poor. Understanding the patient's prognosis, the  family has decided to move towards work-up for potential organ donation.  Acute Hypoxic Hypercapnic Respiratory Failure in the setting of suspected Cocaine Toxicity, Aspiration  Event, Multifocal Pneumonia -Wean PEEP and FiO2 according to [employer] protocol -Treatment maneuvers per [employer] protocol -VAP bundle in place -Intermittent chest x-ray & ABG -PRN Duo-Nebs.  -Start CPT q6hr -Discontinue sedation/analgesia patient currently unresponsive -Unasyn to complete a 5 day course for aspiration PNA -Portable chest x-ray per [facility] protocol  Severe sepsis secondary to aspiration pneumonia - resolved Met SIRS Criteria on admission, now resolved -Monitor fever curve -Daily CBC. Recheck lactic acid.  -Follow cultures as above. Check tracheal aspirate.  -Continue Unsayn for aspiration PNA for a total of 5 days.    Rhabdomyolysis likely in the setting of prolonged downtime -Trend CK -Monitor I&O's / urinary output -Follow BMP -Ensure adequate renal perfusion -Avoid nephrotoxic agents as able -Continue aggressive hydration today.  Continue fluids given elevated CK.  Donor Evaluation --Ongoing work-up --Happy to coordinate with donor services to evaluate his candidacy --Bronchoscopy performed yesterday --Recruitment maneuvers per [facility] --Reassess neuro status   Diabetes mellitus -CBG's AC & hs; Target range of 140 to 180 -SSI -Follow ICU Hypo/Hyperglycemia protocol  Best practice:  Diet:  NPO Pain/Anxiety/Delirium  protocol : N/A VAP protocol (if indicated): Yes DVT prophylaxis: LMWH GI prophylaxis: PPI Glucose control:  SSI Yes Central venous access:  N/A Arterial line:  Yes, and it is still needed Foley:  Yes, and it is still needed Mobility:  bed rest  PT consulted: N/A Last date of multidisciplinary goals of care discussion [6/20] Code Status:  full code Disposition: ICU  = Goals of Care = Code Status Order: DNR.  Primary Emergency Contact: Borenstein,Angela  Family wishes to pursue pursue comfort measures and compassionate extubation. The patient is currently undergoing a donor evaluation.   Critical care time: 45 minutes     The patient is critically ill with multiple organ systems failure and requires high complexity decision making for assessment and support, frequent evaluation and titration of therapies, application of advanced monitoring technologies and extensive interpretation of multiple databases.   Renold Don, MD Advanced Bronchoscopy PCCM [facility]    *This note was dictated using voice recognition software/Dragon.  Despite best efforts to proofread, errors can occur which can change the meaning. Any transcriptional errors that result from this process are unintentional and may not be fully corrected at the time of dictation.

## 2021-12-10 NOTE — Death Summary Note (Signed)
DEATH SUMMARY   Patient Details  Name: Clinton Knight MRN: [id number] DOB: [birth year]  Admission/Discharge Information   Admit Date:  2021/12/03  Date of Death:  12/06/2021  Time of Death:  19:10  Length of Stay: 3  Referring Physician: No primary care provider on file.   Reason(s) for Hospitalization  Drug overdose presenting to the ED with respiratory distress.  Diagnoses  Preliminary cause of death: Extensive Basilar Distribution/PCA Distrubution Stroke Secondary Diagnoses (including complications and co-morbidities):  Principal Problem:   Drug overdose Active Problems:   CVA (cerebral vascular accident) ([place])   Aspiration pneumonia ([place])   Acute respiratory failure with hypoxia and hypercapnia ([place])   Endotracheally intubated   On mechanically assisted ventilation (HCC)   Severe sepsis ([place])   Multiple organ donor   Rhabdomyolysis   Diabetes mellitus [facility])   Brief Hospital Course (including significant findings, care, treatment, and services provided and events leading to death)  Clinton Knight is a 61 y.o. year old male with significant PMH of hypertension, DM and polysubstance abuse who presented to the ED on 12-03-2021 with chief complaints of respiratory distress. Per ED reports, EMS was called to patient's boarding house after being found down by housemate.  Per reports, patient's housemate had not heard from him since Friday however patient's daughter reports that she tried calling him Sunday afternoon and yesterday for Father's Day but he did not pick up.  Last time seen normal was Sunday morning per the patient's sister who claimed she was with the patient.  Per EMS report, patient's housemate busted door open and found him down with what appeared to be white substance on the floor.  On EMS arrival, patient was found to be hypoxic with sats in the 60s to 80s which slowly improved on nonrebreather.  ED Course: In the emergency department, the temperature was 36.4C, the  heart rate 84 beats/minute, the blood pressure 146/99 mm Hg, the respiratory rate 26 breaths/minute, and the oxygen saturation 95% on NRB.  Patient remained unresponsive with gurgling respiration therefore he was intubated for airway protection.  Toxicology was positive for Cocaine.  Friends and family confirmed frequent use of  crack cocaine.  Due to possible sepsis and significantly elevated CK,Patient given IV fluids and started on broad-spectrum antibiotics Vanco and cefepime. PCCM consulted for admission. CT brain & chest imaging on admission showed hypodensity extending from the right caudate and bilateral thalami into the midbrain, pons, and left cerebellum with additional areas of hypodensity in the left parieto-occipital region and left inferior cerebellum, concerning for infarcts that are most likely acute to subacute. And moderate severity bilateral multifocal infiltrates with marked severity left lower lobe consolidation. Patient was started on Unasyn for suspected aspiration event. Follow up MRI brain revealed Extensive diffusion restriction in the bilateral cerebellar hemispheres,  brainstem, thalami, and occipital lobes most in keeping with extensive posterior circulation infarcts (progressed since the prior CT particularly in the occipital lobes) with evidence of associated hemorrhage in the pons, thalami, and occipital lobes. The fourth ventricle and basal cisterns remain patent without evidence of obstructive hydrocephalus at this time. Patient also treated for severe sepsis, rhabdomyolysis and Diabetes Mellitus. PCCM team discussed with the patient's family bedside the patient's overall grave prognosis given the debilitating and progressing posterior circulatory infarcts in his brain. The patient's sister, Clinton Knight & daughter worked with Donor services to evaluate candidacy. Diagnostic bronchoscopy performed. Ultimately, patient a candidate for organ donation. He was taken to the OR with  family  present and extubated at 18:51. Time of death, 19:10.  Pertinent Labs and Studies  Significant Diagnostic Studies DG Chest Port 1 View  Result Date: 2021/12/12 CLINICAL DATA:  Respiratory distress EXAM: PORTABLE CHEST 1 VIEW COMPARISON:  Radiograph 12/01/2021 FINDINGS: Endotracheal tube tip overlies the midthoracic trachea. Orogastric tube passes below the diaphragm, tip and side port overlying the stomach. Interval improvement in bibasilar airspace disease, with residual faint opacities bilaterally. No large pleural effusion. No pneumothorax. Bones are unchanged. IMPRESSION: Interval improvement in bibasilar airspace disease, with residual faint opacities bilaterally. Electronically Signed   By: Maurine Simmering M.D.   On: 12-12-21 09:43   DG Chest Port 1 View  Result Date: 12/01/2021 CLINICAL DATA:  Pneumonia. EXAM: PORTABLE CHEST 1 VIEW COMPARISON:  December 01, 2021 (11:31 a.m.) FINDINGS: There is stable endotracheal tube and nasogastric tube positioning. Mild to moderate severity areas of atelectasis and/or infiltrate are noted within the bilateral lung bases. This is increased in severity when compared to the prior study. There is no evidence of a pleural effusion or pneumothorax. The heart size and mediastinal contours are within normal limits. Multilevel degenerative changes are seen throughout the thoracic spine. IMPRESSION: Mild to moderate severity bibasilar atelectasis and/or infiltrate, increased in severity when compared to the prior study. Electronically Signed   By: Virgina Norfolk M.D.   On: 12/01/2021 19:11   DG Chest Port 1 View  Result Date: 12/01/2021 CLINICAL DATA:  Pneumonia. EXAM: PORTABLE CHEST 1 VIEW COMPARISON:  AP chest 11/12/2021; CT chest 11/14/2021 and 11/30/2021 FINDINGS: Endotracheal tube tip terminates approximately 4.7 cm above the carina. Enteric tube descends below the diaphragm with the tip overlying the stomach in the left upper quadrant. There are multiple  overlying EKG leads. Cardiac silhouette and mediastinal contours are within normal limits. Mild patchy right lower lung airspace opacification is new from 12/06/2021 frontal radiograph but likely corresponds to patchy opacity seen within this region on 11/19/2021 and 11/30/2021 CT. Additional left lower lung patchy airspace opacities as also seen on yesterday's CT. No pleural effusion is seen. No pneumothorax. Moderate multilevel degenerative disc changes of the thoracic spine. IMPRESSION: 1. Endotracheal tube in appropriate position. 2. Right-greater-than-left basilar patchy airspace opacities reflecting the pneumonia as seen on yesterday's CT. Electronically Signed   By: Yvonne Kendall M.D.   On: 12/01/2021 12:15   CT CHEST ABDOMEN PELVIS W CONTRAST  Result Date: 11/30/2021 CLINICAL DATA:  Recent stroke with plan for possible organ donation, evaluate anatomy EXAM: CT CHEST, ABDOMEN, AND PELVIS WITH CONTRAST TECHNIQUE: Multidetector CT imaging of the chest, abdomen and pelvis was performed following the standard protocol during bolus administration of intravenous contrast. RADIATION DOSE REDUCTION: This exam was performed according to the departmental dose-optimization program which includes automated exposure control, adjustment of the mA and/or kV according to patient size and/or use of iterative reconstruction technique. CONTRAST:  159m OMNIPAQUE IOHEXOL 300 MG/ML  SOLN COMPARISON:  CT of the chest from the previous day. FINDINGS: CT CHEST FINDINGS Cardiovascular: Atherosclerotic calcifications of the thoracic aorta are noted. Coronary calcifications are seen. No cardiac enlargement is noted. Pulmonary artery as visualized is within normal limits. Mediastinum/Nodes: Thoracic inlet is unremarkable. No hilar or mediastinal adenopathy is noted. The esophagus is within normal limits. Gastric catheter extends into the stomach. Endotracheal tube is noted in satisfactory position. Lungs/Pleura: Lungs are again well  aerated with patchy airspace opacities throughout both lungs somewhat improved when compared with the prior exam in the left lower lobe. The consolidation seen previously has  resolved with some residual infiltrate identified. No effusion is seen. No pneumothorax is noted. Musculoskeletal: Degenerative changes of the thoracic spine are noted. No rib abnormality is noted. CT ABDOMEN PELVIS FINDINGS Hepatobiliary: Gallbladder demonstrates vicarious excretion of contrast material. The liver appears within normal limits. Pancreas: Unremarkable. No pancreatic ductal dilatation or surrounding inflammatory changes. Spleen: Normal in size without focal abnormality. Adrenals/Urinary Tract: Adrenal glands are within normal limits. Kidneys demonstrate a normal enhancement pattern. No renal calculi or obstructive changes are seen. Normal excretion is noted on delayed images. Bladder is well distended and demonstrates some air within likely related to prior catheterization. Stomach/Bowel: No obstructive or inflammatory changes of the colon are seen. The appendix is within normal limits. Small bowel and stomach are unremarkable. Vascular/Lymphatic: Aortic atherosclerosis. No enlarged abdominal or pelvic lymph nodes. Reproductive: Prostate is unremarkable. Other: No abdominal wall hernia or abnormality. No abdominopelvic ascites. Musculoskeletal: Degenerative changes of lumbar spine are seen. IMPRESSION: Patchy bilateral infiltrates with improved aeration in the left lower lobe when compared with the prior exam. Coronary artery disease is noted with coronary calcification. No acute abnormality is noted within the abdomen or pelvis. Aortic Atherosclerosis (ICD10-I70.0). Electronically Signed   By: Inez Catalina M.D.   On: 11/30/2021 23:05   MR BRAIN WO CONTRAST  Result Date: 11/30/2021 CLINICAL DATA:  Patient found down history of polysubstance abuse, hypertension, diabetes EXAM: MRI HEAD WITHOUT CONTRAST TECHNIQUE: Multiplanar,  multiecho pulse sequences of the brain and surrounding structures were obtained without intravenous contrast. COMPARISON:  CT head dated 1 day prior FINDINGS: Brain: There is extensive diffusion restriction involving the bilateral cerebellar hemispheres in the SCA territory, essentially the entire pons and most of the midbrain, bilateral thalami, and bilateral occipital lobes in the PCA distributions consistent with acute to early subacute infarcts breast since the prior CT particularly in the occipital lobes. SWI signal dropout in the pons, bilateral thalami, and occipital lobes is consistent with associated blood products. The fourth ventricle and basal cisterns remain patent, without hydrocephalus. Additional remote infarcts are seen in the left occipital lobe, left lentiform nucleus, left caudate head, and bilateral cerebellar hemispheres and corona radiata. There is no midline shift. Vascular: The major intracranial flow voids appear preserved. Skull and upper cervical spine: Normal marrow signal. Sinuses/Orbits: There is mild mucosal thickening in the paranasal sinuses. The globes and orbits are unremarkable. Other: There is significant left worse than right periorbital soft tissue edema extending into the forehead and left face. IMPRESSION: 1. Extensive diffusion restriction in the bilateral cerebellar hemispheres, brainstem, thalami, and occipital lobes most in keeping with extensive posterior circulation infarcts (progressed since the prior CT particularly in the occipital lobes) with evidence of associated hemorrhage in the pons, thalami, and occipital lobes. The fourth ventricle and basal cisterns remain patent without evidence of obstructive hydrocephalus at this time. Consider vascular imaging. 2. Additional smaller remote infarcts as above. 3. Soft tissue edema in the forehead, periorbital regions, and left face. Correlate with physical exam. Electronically Signed   By: Valetta Mole M.D.   On:  11/30/2021 15:07   ECHOCARDIOGRAM COMPLETE  Result Date: 11/30/2021    ECHOCARDIOGRAM REPORT   Patient Name:   LEBERT LOVERN Date of Exam: 11/30/2021 Medical Rec #:  [id number]       Height:       68.0 in Accession #:    [id number]      Weight:       162.7 lb Date of Birth:  September 10, 1960  BSA:          1.872 m Patient Age:    60 years        BP:           140/103 mmHg Patient Gender: M               HR:           112 bpm. Exam Location:  [facility] Procedure: 2D Echo, Color Doppler and Cardiac Doppler Indications:     I63.9 Stroke  History:         Patient has no prior history of Echocardiogram examinations.                  Risk Factors:Hypertension and Diabetes. Hx of polysubstance                  abuse.  Sonographer:     Charmayne Sheer Referring Phys:  [place] OUMA Diagnosing Phys: Kate Sable MD  Sonographer Comments: Echo performed with patient supine and on artificial respirator and Technically difficult study due to poor echo windows. Image acquisition challenging due to respiratory motion. IMPRESSIONS  1. Left ventricular ejection fraction, by estimation, is 55 to 60%. The left ventricle has normal function. The left ventricle has no regional wall motion abnormalities. Left ventricular diastolic parameters are consistent with Grade I diastolic dysfunction (impaired relaxation).  2. Right ventricular systolic function is normal. The right ventricular size is not well visualized.  3. The mitral valve is normal in structure. No evidence of mitral valve regurgitation.  4. The aortic valve was not well visualized. Aortic valve regurgitation is not visualized.  5. The inferior vena cava is normal in size with greater than 50% respiratory variability, suggesting right atrial pressure of 3 mmHg. FINDINGS  Left Ventricle: Left ventricular ejection fraction, by estimation, is 55 to 60%. The left ventricle has normal function. The left ventricle has no regional wall motion abnormalities. The left  ventricular internal cavity size was normal in size. There is  no left ventricular hypertrophy. Left ventricular diastolic parameters are consistent with Grade I diastolic dysfunction (impaired relaxation). Right Ventricle: The right ventricular size is not well visualized. No increase in right ventricular wall thickness. Right ventricular systolic function is normal. Left Atrium: Left atrial size was normal in size. Right Atrium: Right atrial size was normal in size. Pericardium: There is no evidence of pericardial effusion. Mitral Valve: The mitral valve is normal in structure. No evidence of mitral valve regurgitation. Tricuspid Valve: The tricuspid valve is normal in structure. Tricuspid valve regurgitation is not demonstrated. Aortic Valve: The aortic valve was not well visualized. Aortic valve regurgitation is not visualized. Aortic valve mean gradient measures 2.0 mmHg. Aortic valve peak gradient measures 5.3 mmHg. Aortic valve area, by VTI measures 3.48 cm. Pulmonic Valve: The pulmonic valve was normal in structure. Pulmonic valve regurgitation is not visualized. Aorta: The aortic root is normal in size and structure. Venous: The inferior vena cava is normal in size with greater than 50% respiratory variability, suggesting right atrial pressure of 3 mmHg. IAS/Shunts: No atrial level shunt detected by color flow Doppler.  LEFT VENTRICLE PLAX 2D LVIDd:         3.45 cm   Diastology LVIDs:         2.74 cm   LV e' medial:    5.87 cm/s LV PW:         1.05 cm   LV E/e' medial:  10.8 LV IVS:  0.72 cm   LV e' lateral:   6.09 cm/s LVOT diam:     2.00 cm   LV E/e' lateral: 10.4 LV SV:         42 LV SV Index:   23 LVOT Area:     3.14 cm  RIGHT VENTRICLE RV Basal diam:  2.55 cm LEFT ATRIUM         Index LA diam:    2.60 cm 1.39 cm/m  AORTIC VALVE                    PULMONIC VALVE AV Area (Vmax):    2.46 cm     PV Vmax:       1.09 m/s AV Area (Vmean):   2.80 cm     PV Peak grad:  4.8 mmHg AV Area (VTI):      3.48 cm AV Vmax:           115.00 cm/s AV Vmean:          67.100 cm/s AV VTI:            0.122 m AV Peak Grad:      5.3 mmHg AV Mean Grad:      2.0 mmHg LVOT Vmax:         90.10 cm/s LVOT Vmean:        59.900 cm/s LVOT VTI:          0.135 m LVOT/AV VTI ratio: 1.11  AORTA Ao Root diam: 3.10 cm MITRAL VALVE MV Area (PHT): 4.99 cm    SHUNTS MV Decel Time: 152 msec    Systemic VTI:  0.14 m MV E velocity: 63.40 cm/s  Systemic Diam: 2.00 cm MV A velocity: 76.30 cm/s MV E/A ratio:  0.83 Kate Sable MD Electronically signed by Kate Sable MD Signature Date/Time: 11/30/2021/2:10:52 PM    Final    DG Abd 1 View  Result Date: 11/30/2021 CLINICAL DATA:  Orogastric tube placement EXAM: ABDOMEN - 1 VIEW COMPARISON:  Portable exam 1034 hours compared to 0907 hours. FINDINGS: Orogastric tube is been advanced into stomach. Nonobstructive bowel gas pattern. Slight gaseous distention of stomach persists. Atelectasis versus consolidation in retrocardiac LEFT lower lobe. IMPRESSION: Orogastric tube has been advanced into stomach. Atelectasis versus consolidation LEFT lower lobe. Electronically Signed   By: Lavonia Dana M.D.   On: 11/30/2021 10:46   DG Abd 1 View  Result Date: 11/30/2021 CLINICAL DATA:  OG tube placement EXAM: ABDOMEN - 1 VIEW COMPARISON:  Radiograph dated December 07, 2021 FINDINGS: OG tube tip projects over the stomach with side port near the GE junction. No gas-filled dilated loops of bowel are seen in the visualized abdomen and pelvis. Visualized lung bases are clear. IMPRESSION: OG tube tip projects over the stomach with side port near the GE junction, consider advancement for optimal positioning. Electronically Signed   By: Yetta Glassman M.D.   On: 11/30/2021 09:16   CT CHEST WO CONTRAST  Result Date: 11/20/2021 CLINICAL DATA:  Unresponsive with hypoxia. EXAM: CT CHEST WITHOUT CONTRAST TECHNIQUE: Multidetector CT imaging of the chest was performed following the standard protocol without IV  contrast. RADIATION DOSE REDUCTION: This exam was performed according to the departmental dose-optimization program which includes automated exposure control, adjustment of the mA and/or kV according to patient size and/or use of iterative reconstruction technique. COMPARISON:  None Available. FINDINGS: Cardiovascular: There is mild calcification of the aortic arch, without evidence of aortic aneurysm. Normal heart size with marked  severity coronary artery calcification. No pericardial effusion. Mediastinum/Nodes: Endotracheal and nasogastric tubes are in place. There is mild pretracheal lymphadenopathy. Thyroid gland, trachea, and esophagus demonstrate no significant findings. Lungs/Pleura: Moderate severity bilateral multifocal infiltrates are seen within the bilateral upper lobes, right middle lobe and right lower lobe. Marked severity consolidation is seen throughout the left lower lobe. Associated left lower lobe volume loss is noted. There is no evidence of a pleural effusion or pneumothorax. Upper Abdomen: No acute abnormality. Musculoskeletal: Degenerative changes are noted within the thoracic spine. IMPRESSION: 1. Moderate severity bilateral multifocal infiltrates. 2. Marked severity left lower lobe consolidation. Follow-up to resolution is recommended, as an underlying neoplastic process cannot be excluded. 3. Marked severity coronary artery disease. Aortic Atherosclerosis (ICD10-I70.0). Electronically Signed   By: Virgina Norfolk M.D.   On: 11/28/2021 22:30   CT HEAD WO CONTRAST (5MM)  Result Date: 11/27/2021 CLINICAL DATA:  Found down, hypertensive EXAM: CT HEAD WITHOUT CONTRAST TECHNIQUE: Contiguous axial images were obtained from the base of the skull through the vertex without intravenous contrast. RADIATION DOSE REDUCTION: This exam was performed according to the departmental dose-optimization program which includes automated exposure control, adjustment of the mA and/or kV according to patient  size and/or use of iterative reconstruction technique. COMPARISON:  None Available. FINDINGS: Brain: Confluent area of hypodensity in the right caudate, left-greater-than-right thalamus, mid brain, pons, and left cerebellum anteriorly. Additional area of hypodensity in the left occipital lobe, extending to the left parietal lobe (series 6, image 30) and in the inferior left cerebellum. No acute hemorrhage, mass, mass effect, or midline shift. Lacunar infarcts in the basal ganglia. No hydrocephalus or extra-axial collection. Vascular: No definite hyperdense vessel. Density in the distal basilar artery is conspicuous but appear similar to other vasculature. Atherosclerotic calcifications in the intracranial carotid and vertebral arteries. Skull: Normal. Negative for fracture or focal lesion. Sinuses/Orbits: Mucosal thickening in the left maxillary sinus and ethmoid air cells. The orbits are unremarkable. Other: The mastoids are well aerated. IMPRESSION: Hypodensity extending from the right caudate and bilateral thalami into the midbrain, pons, and left cerebellum with additional areas of hypodensity in the left parieto-occipital region and left inferior cerebellum, concerning for infarcts that are most likely acute to subacute. These results were called by telephone at the time of interpretation on 12/04/2021 at 8:57 pm to provider [facility] , who verbally acknowledged these results. Electronically Signed   By: Merilyn Baba M.D.   On: 11/22/2021 20:59   DG Abdomen 1 View  Result Date: 11/28/2021 CLINICAL DATA:  Status post intubation and OG tube placement. EXAM: ABDOMEN - 1 VIEW COMPARISON:  None Available. FINDINGS: An endotracheal tube is seen with its distal tip approximately 2.5 cm from the carina. Mild atelectatic changes are seen within the left lung base. A nasogastric tube is seen with its distal tip overlying the body of the stomach. The distal side hole sits approximately 3.3 cm distal to the expected  region of the gastroesophageal junction. The bowel gas pattern is normal. No radio-opaque calculi or other significant radiographic abnormality are seen. IMPRESSION: Nasogastric tube positioning, as described above. Electronically Signed   By: Virgina Norfolk M.D.   On: 11/10/2021 19:29   DG Chest Portable 1 View  Result Date: 12/07/2021 CLINICAL DATA:  Respiratory distress EXAM: PORTABLE CHEST 1 VIEW COMPARISON:  None Available. FINDINGS: Somewhat limited due to rotation. Endotracheal tube tip is approximately 9 mm above the carina. Enteric tube tip is in the stomach. Cardiomediastinal silhouette appears within  normal limits. Small opacities in the left lower lung zone which may represent subsegmental atelectasis. No significant pleural effusion or pneumothorax identified. IMPRESSION: 1. Medical devices as described. 2. Likely mild subsegmental atelectasis in the left lower lung zone. Electronically Signed   By: Ofilia Neas M.D.   On: 12/08/2021 19:25    Microbiology Recent Results (from the past 240 hour(s))  Blood culture (routine x 2)     Status: None (Preliminary result)   Collection Time: 11/25/2021  8:12 PM   Specimen: BLOOD  Result Value Ref Range Status   Specimen Description BLOOD LEFT ASSIST CONTROL  Final   Special Requests   Final    BOTTLES DRAWN AEROBIC AND ANAEROBIC Blood Culture results may not be optimal due to an inadequate volume of blood received in culture bottles   Culture   Final    NO GROWTH 3 DAYS Performed at [facility], [address] [place], [place] [address]    Report Status PENDING  Incomplete  Blood culture (routine x 2)     Status: None (Preliminary result)   Collection Time: 11/30/2021  8:12 PM   Specimen: BLOOD  Result Value Ref Range Status   Specimen Description BLOOD RIGHT FOREARM  Final   Special Requests   Final    BOTTLES DRAWN AEROBIC AND ANAEROBIC Blood Culture results may not be optimal due to an inadequate volume of blood  received in culture bottles   Culture   Final    NO GROWTH 3 DAYS Performed at [facility], [address], [place], [place] [address]    Report Status PENDING  Incomplete  SARS Coronavirus 2 by RT PCR (hospital order, performed in [facility] hospital lab) *cepheid single result test* Anterior Nasal Swab     Status: None   Collection Time: 12/01/2021  9:58 PM   Specimen: Anterior Nasal Swab  Result Value Ref Range Status   SARS Coronavirus 2 by RT PCR NEGATIVE NEGATIVE Final    Comment: (NOTE) SARS-CoV-2 target nucleic acids are NOT DETECTED.  The SARS-CoV-2 RNA is generally detectable in upper and lower respiratory specimens during the acute phase of infection. The lowest concentration of SARS-CoV-2 viral copies this assay can detect is 250 copies / mL. A negative result does not preclude SARS-CoV-2 infection and should not be used as the sole basis for treatment or other patient management decisions.  A negative result may occur with improper specimen collection / handling, submission of specimen other than nasopharyngeal swab, presence of viral mutation(s) within the areas targeted by this assay, and inadequate number of viral copies (<250 copies / mL). A negative result must be combined with clinical observations, patient history, and epidemiological information.  Fact Sheet for Patients:   https://www.patel.info/  Fact Sheet for Healthcare Providers: https://hall.com/  This test is not yet approved or  cleared by the [place] FDA and has been authorized for detection and/or diagnosis of SARS-CoV-2 by FDA under an Emergency Use Authorization (EUA).  This EUA will remain in effect (meaning this test can be used) for the duration of the COVID-19 declaration under Section 564(b)(1) of the Act, 21 U.S.C. section 360bbb-3(b)(1), unless the authorization is terminated or revoked sooner.  Performed at [facility],  [address] [place], [place] [address]   MRSA Next Gen by PCR, Nasal     Status: None   Collection Time: 11/15/2021 11:26 PM   Specimen: Nasal Mucosa; Nasal Swab  Result Value Ref Range Status  MRSA by PCR Next Gen NOT DETECTED NOT DETECTED Final    Comment: (NOTE) The GeneXpert MRSA Assay (FDA approved for NASAL specimens only), is one component of a comprehensive MRSA colonization surveillance program. It is not intended to diagnose MRSA infection nor to guide or monitor treatment for MRSA infections. Test performance is not FDA approved in patients less than 80 years old. Performed at [facility], [address] [place], [place] [address]   Culture, Respiratory w Gram Stain     Status: None   Collection Time: 11/30/21  8:04 AM   Specimen: Tracheal Aspirate; Respiratory  Result Value Ref Range Status   Specimen Description   Final    TRACHEAL ASPIRATE Performed at [facility], [address] [place], [place] [address]    Special Requests   Final    NONE Performed at [facility], [address] [place], [place] [address]    Gram Stain   Final    MODERATE WBC PRESENT, PREDOMINANTLY PMN RARE GRAM POSITIVE COCCI IN PAIRS RARE GRAM NEGATIVE RODS    Culture   Final    RARE Consistent with normal respiratory flora. No Pseudomonas species isolated Performed at [facility] [address], [place], [place] [address]    Report Status 12/21/2021 FINAL  Final  Urine Culture     Status: None   Collection Time: 12/01/21 12:17 AM   Specimen: Urine, Random  Result Value Ref Range Status   Specimen Description   Final    URINE, RANDOM Performed at [facility], [address] [place], [place] [address]    Special Requests   Final    NONE Performed at [facility], [address], [place], [place] [address]    Culture   Final    NO GROWTH Performed at [facility], [place] [address], [place], [place]  [address]    Report Status Dec 21, 2021 FINAL  Final  Culture, Respiratory w Gram Stain     Status: None (Preliminary result)   Collection Time: 12/01/21 11:01 AM   Specimen: Bronchoalveolar Lavage; Respiratory  Result Value Ref Range Status   Specimen Description   Final    BRONCHIAL ALVEOLAR LAVAGE Performed at [facility], [address] [place], [place] [address]    Special Requests   Final    NONE Performed at [facility], [place], [place] [address]    Gram Stain   Final    FEW WBC PRESENT,BOTH PMN AND MONONUCLEAR NO ORGANISMS SEEN    Culture   Final    NO GROWTH < 24 HOURS Performed at [facility], [place] [address] [place],  [address]    Report Status PENDING  Incomplete    Lab Basic Metabolic Panel: Recent Labs  Lab 12/01/21 1101 12/01/21 1706 12/01/21 2250 12-21-21 0453 12-21-21 1120  NA 140 140 140 141 141  K 4.0 3.9 3.8 3.4* 3.7  CL 109 109 108 110 110  CO2 _0 GLUCOSE 129* 149* 134* 165* 154*  BUN 24* 25* 26* 26* 24*  CREATININE 1.02 0.98 0.97 0.86 0.83  CALCIUM 8.2* 8.1* 8.0* 7.9* 8.0*  MG 1.9 1.9 2.0 2.1 2.0  PHOS 3.2 2.4* 2.3* 2.2* 2.4*   Liver Function Tests: Recent Labs  Lab 12/01/21 1101 12/01/21 1706 12/01/21 2250 12/21/2021 0453 Dec 21, 2021 1120  AST 40 35 37 43* 45*  ALT _1 35  ALKPHOS  62 59 64 68 71  BILITOT 0.4 0.2* 0.4 0.2* 0.2*  PROT 5.3* 5.0* 4.8* 4.8* 4.7*  ALBUMIN 2.2* 2.2* 2.0* 2.0* 2.0*   No results for input(s): "LIPASE", "AMYLASE" in the last 168 hours. No results for input(s): "AMMONIA" in the last 168 hours. CBC: Recent Labs  Lab 11/20/2021 1850 11/30/21 0357 12/01/21 0039 12/01/21 1101 12/01/21 2250 December 30, 2021 0453 December 30, 2021 1120  WBC 12.5*   < > 13.0* 12.7* 11.9* 10.9* 10.5  NEUTROABS 9.5*  --   --   --   --   --   --   HGB 15.3   < > 13.8 12.4* 10.8* 10.5* 10.3*  HCT 50.3   < > 44.7 40.9 35.7* 33.8* 33.4*  MCV 88.4   < > 87.8 90.1 88.6 89.7 88.1   PLT 216   < > 181 169 152 143* 135*   < > = values in this interval not displayed.   Cardiac Enzymes: Recent Labs  Lab 12/09/2021 1850 11/30/21 0357 12/01/21 0749  CKTOTAL 4,319* 4,989* 1,369*   Sepsis Labs: Recent Labs  Lab 11/12/2021 1850 11/22/2021 2105 11/30/21 0357 12/01/21 0039 12/01/21 0500 12/01/21 0502 12/01/21 1101 12/01/21 2250 Dec 30, 2021 0453 2021/12/30 1120  PROCALCITON <0.10  --  3.95  --  6.04  --   --   --   --   --   WBC 12.5*  --  9.7   < >  --   --  12.7* 11.9* 10.9* 10.5  LATICACIDVEN 1.9 1.6 4.0*  --   --  1.4  --   --   --   --    < > = values in this interval not displayed.    Procedures/Operations  11/26/2021: ETT placement 11/30/21: Arterial placement 12/01/21: Bronchoscopy   Huel Cote Rust-Chester 30-Dec-2021, 7:37 PM  Domingo Pulse Rust-Chester, AGACNP-BC Acute Care Nurse Practitioner [facility]   [phone] / [phone] Please see Amion for pager details.

## 2021-12-10 NOTE — Progress Notes (Addendum)
ABG drawn, from [facility], on 100% and +5 PEEP per O2 challenge protocol. FIO2 decreased back to previous 30% and increased PEEP back to +10.

## 2021-12-10 NOTE — Progress Notes (Signed)
Pt was transported to [place] for organ donation procedure. Extubated at 1851 and time of death is 1910,  heart and lung sound are absent, no spontaneous cardiac or respiratory activity, No corneal pupillary reflex present . Pupils fixed and dilated. Two RN pronounced MR [employer]. Kleve  death at 1910 on  2021-12-12. Patient family was updated by the [facility] staff  and [facility] chaplain.

## 2021-12-10 NOTE — Progress Notes (Signed)
Mckayla w/ [facility] just called . She wants [place] to order to prepare 5 units PRBCs for on hold in OR tomorrow b/c they will be placing him on a device called transmedics transfusion pump for his liver. 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 He IS a candidate for liver and kidneys. Also OR time is being changed to 1800 on 12/03/21 now.

## 2021-12-10 NOTE — Progress Notes (Signed)
O2 Challenged requested from Donor services, recruitment maneuver performed with +20 of PEEP for 30 Seconds and patients FIO2 increased to 100% with PEEP being decreased to +5.

## 2021-12-10 NOTE — Progress Notes (Signed)
I responded to a page to provide spiritual support for the patient's family before organ donation surgery. I arrived at the patient's room where his sister and aunt were present. I shared words of comfort, read scripture and led in prayer. I provided pastoral presence with the patient's entire family as they walked behind the patient's bed toward the operating room.    12-30-21 1834  Clinical Encounter Type  Visited With Patient and family together  Visit Type Initial;Spiritual support  Referral From Nurse  Consult/Referral To Chaplain  Spiritual Encounters  Spiritual Needs Sacred text;Prayer;Emotional;Grief support    Chaplain Dr Melvyn Novas

## 2021-12-10 NOTE — Progress Notes (Signed)
Multiple families at bedside and Chaplain came and spoke with family. Plan is to transport pt to OR around 1800

## 2021-12-10 NOTE — Progress Notes (Signed)
   11/10/2021 1400  Clinical Encounter Type  Visited With Patient and family together  Visit Type Initial  Referral From Nurse  Consult/Referral To Chaplain  Spiritual Encounters  Spiritual Needs Grief support;Prayer   Chaplain responded to call to provided Bible and prayer blanket and support.

## 2021-12-10 DEATH — deceased
